# Patient Record
Sex: Female | Born: 1955 | Race: White | Hispanic: No | Marital: Married | Smoking: Never smoker
Health system: Southern US, Community
[De-identification: ages and names within clinical notes are randomized; demographics above are authoritative.]

## PROBLEM LIST (undated history)

## (undated) DIAGNOSIS — M2550 Pain in unspecified joint: Secondary | ICD-10-CM

## (undated) DIAGNOSIS — R600 Localized edema: Secondary | ICD-10-CM

## (undated) DIAGNOSIS — M1990 Unspecified osteoarthritis, unspecified site: Secondary | ICD-10-CM

## (undated) DIAGNOSIS — R42 Dizziness and giddiness: Secondary | ICD-10-CM

## (undated) DIAGNOSIS — M549 Dorsalgia, unspecified: Secondary | ICD-10-CM

## (undated) DIAGNOSIS — R519 Headache, unspecified: Secondary | ICD-10-CM

## (undated) DIAGNOSIS — G4730 Sleep apnea, unspecified: Secondary | ICD-10-CM

## (undated) DIAGNOSIS — T7840XA Allergy, unspecified, initial encounter: Secondary | ICD-10-CM

## (undated) DIAGNOSIS — E559 Vitamin D deficiency, unspecified: Secondary | ICD-10-CM

## (undated) DIAGNOSIS — Z889 Allergy status to unspecified drugs, medicaments and biological substances status: Secondary | ICD-10-CM

## (undated) DIAGNOSIS — R0602 Shortness of breath: Secondary | ICD-10-CM

## (undated) DIAGNOSIS — D649 Anemia, unspecified: Secondary | ICD-10-CM

## (undated) DIAGNOSIS — R51 Headache: Secondary | ICD-10-CM

## (undated) DIAGNOSIS — J45909 Unspecified asthma, uncomplicated: Secondary | ICD-10-CM

## (undated) HISTORY — PX: TONSILLECTOMY: SUR1361

## (undated) HISTORY — DX: Vitamin D deficiency, unspecified: E55.9

## (undated) HISTORY — DX: Dizziness and giddiness: R42

## (undated) HISTORY — PX: FRACTURE SURGERY: SHX138

## (undated) HISTORY — DX: Pain in unspecified joint: M25.50

## (undated) HISTORY — PX: OTHER SURGICAL HISTORY: SHX169

## (undated) HISTORY — DX: Localized edema: R60.0

## (undated) HISTORY — PX: LIPOMA EXCISION: SHX5283

## (undated) HISTORY — DX: Sleep apnea, unspecified: G47.30

## (undated) HISTORY — PX: ABDOMINAL HYSTERECTOMY: SHX81

## (undated) HISTORY — DX: Unspecified asthma, uncomplicated: J45.909

## (undated) HISTORY — DX: Headache, unspecified: R51.9

## (undated) HISTORY — PX: JOINT REPLACEMENT: SHX530

## (undated) HISTORY — DX: Morbid (severe) obesity due to excess calories: E66.01

## (undated) HISTORY — DX: Unspecified osteoarthritis, unspecified site: M19.90

## (undated) HISTORY — PX: KNEE ARTHROSCOPY: SHX127

## (undated) HISTORY — DX: Dorsalgia, unspecified: M54.9

## (undated) HISTORY — DX: Allergy, unspecified, initial encounter: T78.40XA

## (undated) HISTORY — DX: Anemia, unspecified: D64.9

## (undated) HISTORY — PX: HEEL SPUR EXCISION: SHX1733

## (undated) HISTORY — PX: TUBAL LIGATION: SHX77

---

## 1998-08-16 ENCOUNTER — Other Ambulatory Visit: Admission: RE | Admit: 1998-08-16 | Discharge: 1998-08-16 | Payer: Self-pay | Admitting: Gynecology

## 2001-02-24 ENCOUNTER — Encounter (INDEPENDENT_AMBULATORY_CARE_PROVIDER_SITE_OTHER): Payer: Self-pay | Admitting: Specialist

## 2001-02-24 ENCOUNTER — Other Ambulatory Visit: Admission: RE | Admit: 2001-02-24 | Discharge: 2001-02-24 | Payer: Self-pay | Admitting: *Deleted

## 2001-04-15 ENCOUNTER — Encounter (INDEPENDENT_AMBULATORY_CARE_PROVIDER_SITE_OTHER): Payer: Self-pay

## 2001-04-15 ENCOUNTER — Observation Stay (HOSPITAL_COMMUNITY): Admission: RE | Admit: 2001-04-15 | Discharge: 2001-04-16 | Payer: Self-pay | Admitting: *Deleted

## 2002-02-10 ENCOUNTER — Other Ambulatory Visit: Admission: RE | Admit: 2002-02-10 | Discharge: 2002-02-10 | Payer: Self-pay | Admitting: *Deleted

## 2003-01-28 ENCOUNTER — Other Ambulatory Visit: Admission: RE | Admit: 2003-01-28 | Discharge: 2003-01-28 | Payer: Self-pay | Admitting: *Deleted

## 2005-09-06 ENCOUNTER — Other Ambulatory Visit: Admission: RE | Admit: 2005-09-06 | Discharge: 2005-09-06 | Payer: Self-pay | Admitting: *Deleted

## 2009-04-11 ENCOUNTER — Encounter: Payer: Self-pay | Admitting: Pulmonary Disease

## 2009-05-10 ENCOUNTER — Ambulatory Visit: Payer: Self-pay | Admitting: Pulmonary Disease

## 2009-05-10 DIAGNOSIS — J984 Other disorders of lung: Secondary | ICD-10-CM

## 2009-05-10 DIAGNOSIS — J22 Unspecified acute lower respiratory infection: Secondary | ICD-10-CM | POA: Insufficient documentation

## 2009-05-10 HISTORY — DX: Unspecified acute lower respiratory infection: J22

## 2009-09-30 ENCOUNTER — Encounter: Payer: Self-pay | Admitting: Pulmonary Disease

## 2009-10-10 ENCOUNTER — Encounter: Payer: Self-pay | Admitting: Pulmonary Disease

## 2009-10-19 ENCOUNTER — Encounter: Payer: Self-pay | Admitting: Pulmonary Disease

## 2009-10-20 ENCOUNTER — Encounter: Payer: Self-pay | Admitting: Pulmonary Disease

## 2009-10-26 ENCOUNTER — Telehealth: Payer: Self-pay | Admitting: Pulmonary Disease

## 2010-03-03 ENCOUNTER — Ambulatory Visit (HOSPITAL_BASED_OUTPATIENT_CLINIC_OR_DEPARTMENT_OTHER): Admission: RE | Admit: 2010-03-03 | Discharge: 2010-03-03 | Payer: Self-pay | Admitting: Orthopedic Surgery

## 2010-09-19 NOTE — Miscellaneous (Signed)
Summary: f/u ct chest with stable RLL nodule.  Clinical Lists Changes  f/u ct chest with no change in RLL nodule.  Will need one more noncontrast f/u in one year.  Appended Document: f/u ct chest with stable RLL nodule. megan, please let pt know that chest ct stable.  would check one more time in a year, then no further.  Appended Document: f/u ct chest with stable RLL nodule. ATC pt at home #.  NA and unable to leave message.  "Voicemail box full."  ATC pt at work #. Unable to reach pt.  Will send pt a letter to call our office to discuss test results.

## 2010-09-19 NOTE — Miscellaneous (Signed)
Summary: Orders Update   Clinical Lists Changes  Orders: Added new Referral order of Radiology Referral (Radiology) - Signed 

## 2010-09-19 NOTE — Letter (Signed)
Summary: [facility]  [address]   [place], [place] [address]   Phone: [phone]  Fax: [phone]    10/20/2009  Rebecca Rojas [address] [place], [place]  [address]  Dear [name],   We have attempted to contact you by phone but have been unsuccessful.  Please call our office at your earliest convenience so that we may discuss your test results with you.  Thank you.       Sincerely,   Marcelyn Bruins, M.D.

## 2010-09-19 NOTE — Progress Notes (Signed)
Summary: returned call/ results  Phone Note Call from Patient   Caller: Patient Call For: clance Summary of Call: pt returned call re: results. call [phone] (i have changed this home # per pt request)  Initial call taken by: Tivis Ringer, CNA,  October 26, 2009 2:04 PM  Follow-up for Phone Call        pt advised of results and need to have f/u scan in one year. order placed. Carron Curie CMA  October 26, 2009 2:16 PM

## 2010-09-20 HISTORY — PX: COLONOSCOPY: SHX174

## 2010-10-18 ENCOUNTER — Other Ambulatory Visit: Payer: Self-pay | Admitting: Pulmonary Disease

## 2010-10-18 DIAGNOSIS — R911 Solitary pulmonary nodule: Secondary | ICD-10-CM

## 2010-10-24 ENCOUNTER — Other Ambulatory Visit: Payer: Self-pay

## 2010-10-24 ENCOUNTER — Encounter: Payer: Self-pay | Admitting: Pulmonary Disease

## 2010-11-01 ENCOUNTER — Encounter: Payer: Self-pay | Admitting: Pulmonary Disease

## 2010-11-07 NOTE — Miscellaneous (Signed)
Summary: f/u ct chest stable..no further f/u.  Clinical Lists Changes  ct chest 10/2010 shows no change in nodules...therefore, no further f/u needed.  Appended Document: f/u ct chest stable..no further f/u. megan, please let pt know that ct chest stable.  no further f/u needed...good news.  Appended Document: f/u ct chest stable..no further f/u. [id number]  Appended Document: f/u ct chest stable..no further f/u. pt called back.  informed her of ct results.  pt verbalized understanding and denied any questions.

## 2010-11-07 NOTE — Miscellaneous (Signed)
Summary: Orders Update   Clinical Lists Changes  Orders: Added new Referral order of Radiology Referral (Radiology) - Signed 

## 2011-01-05 NOTE — Op Note (Signed)
[facility]  Patient:    Rebecca Rojas, Rebecca Rojas Visit Number: [id number] MRN: [id number]          Service Type: SUR Location: 4W 0448 01 Attending Physician:  Collene Schlichter Dictated by:   Almedia Balls Randell Patient, M.D. Proc. Date: 04/15/01 Adm. Date:  04/15/2001   CC:         Harl Bowie, M.D.   Operative Report  PREOPERATIVE DIAGNOSIS:  Abnormal uterine bleeding with pain.  POSTOPERATIVE DIAGNOSES: 1. Abnormal uterine bleeding with pain, pending pathology. 2. 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  OPERATION:  Abdominal supracervical hysterectomy.  SURGEON:  Almedia Balls. Randell Patient, M.D.  ASSISTANT:  Harl Bowie, M.D.  ANESTHESIA:  General endotracheal anesthesia.  INDICATIONS:  The patient is a 55 year old with the above-noted problems.  She has been counselled as to the need for surgery to evaluate and treat these problems.  She has been fully counselled as to the nature of the procedure and the risks involved to include risks of anesthesia, injury to bowel, bladder, blood vessels, ureters, postoperative hemorrhage, infection, and recuperation, use of hormone replacement therapy should her ovaries be removed.  She fully understands all of these considerations and wishes to proceed on 15 April 2001.  DESCRIPTION OF PROCEDURE:  On entry into the abdomen, there were noted to be some filmy adhesions involving omentum to the anterior peritoneal surface in the area of the umbilicus.  The area of the lower liver edge, diaphragms, spleen, kidneys, periaortic areas, and appendix were normal to visualization and/or palpation.  The uterus was mid posterior, approximately 8-week gestational size, somewhat soft.  There were cysts of Morgagni on the proximal tubes bilaterally.  There was a larger functional cyst on the left ovary which contained serous fluid.  There was an adhesion involving the distal end of the left tube to a loop of  bowel.  DESCRIPTION OF PROCEDURE:     With the patient under general anesthesia, prepared and draped in the usual sterile fashion, with the Foley catheter in the bladder, a lower abdominal transverse incision was made and carried into the peritoneal cavity without difficulty.  A self-retaining retractor was placed, and the bowel was packed off.  Kelly clamps were placed on the uterine/ovarian anastomoses, tubes and round ligaments bilaterally for traction and hemostasis.  Bovie electrocautery was used to transect the round ligaments bilaterally with entry into the retroperitoneal space and development of the bladder flap anteriorly.  Heaney clamps were then used to clamp the uterine/ovarian anastomoses and tubes bilaterally; these structure were cut free of the uterus and doubly ligated with #1 chromic catgut for conservation of both ovaries.  The hydatid cysts were removed without difficulty using Bovie electrocoagulation.  Skeletonization of the uterine vessels and development of the bladder flap anteriorly was then carried out without difficulty.  Heaney clamps were used to clamp the uterine vessels bilaterally; these structures were then cut and suture ligated with #1 chromic catgut.  Heaney clamps were used to clamp the cardinal ligaments bilaterally which were then cut and suture ligated with #1 chronic catgut. Figure-of-eight  sutures were necessary in the left paracervical area for hemostasis.  The uterine fundus was then excised from the uterine cervix using Bovie electrocoagulation.  Small bleeders were rendered hemostatic Bovie Electrocoagulation.  Figure-of-eight sutures were used to reapproximate the cervix and to render it hemostatic.  Small bleeders under the bladder flap were rendered hemostatic with Bovie electrocoagulation.  The area was lavaged with copious amounts of  lactated Ringers.  After noting that hemostasis was maintained in this area, a piece of Surgicel was  placed under the bladder flap for further hemostasis.  The adnexal structures were suspended from the round ligaments bilaterally with interrupted suture of #1 chromic catgut.  At this point with good hemostasis and correct sponge and instrument count, the peritoneum was closed with a continuous suture of #0 Vicryl.  Fascia was closed with two sutures of #0 Vicryl which were brought from the lateral aspects of the incision and tied separately in the midline.  Subcutaneous fat was reapproximated with interrupted sutures of 0 Vicryl.  Skin was closed with a subcuticular suture of 3-0 plain catgut.  Estimated blood loss 250 ml.  The patient was taken to the recovery room in good condition with clear urine in the Foley catheter tubing.  She will be placed on 23-hour observation following surgery. Dictated by:   Almedia Balls Randell Patient, M.D. Attending Physician:  Collene Schlichter DD:  04/15/01 TD:  04/15/01 Job: [id number]

## 2011-01-05 NOTE — Discharge Summary (Signed)
[facility]  Patient:    Rebecca Rojas, Rebecca Rojas Visit Number: [id number] MRN: [id number]          Service Type: SUR Location: 4W 0448 01 Attending Physician:  Collene Schlichter Dictated by:   Almedia Balls Randell Patient, M.D. Adm. Date:  04/15/2001 Disc. Date: 04/16/01                             Discharge Summary  HISTORY OF PRESENT ILLNESS:  The patient is a 55 year old with abnormal uterine bleeding and pelvic pain for hysterectomy with possible bilateral salpingo-oophorectomy on April 15, 2001.  The remainder of her History and Physical are as previously dictated.  LABORATORY DATA AND X-RAY FINDINGS:  Preoperative hemoglobin 13.4.  HOSPITAL COURSE:  The patient was taken to the operating room on August 27, at which time abdominal supracervical hysterectomy, lysis of adhesions and excisions of cysts were performed.  The patient did well postoperatively. Diet and ambulation progressed over the evening of August 27, and early morning of August 28.  On the morning of August 28, she was afebrile experiencing no problems and it was felt that she could be discharged at this time.  DISCHARGE DIAGNOSES: 1. Abnormal uterine bleeding. 2. Pelvic pain. 3. Pelvic adhesions.  PROCEDURE:  Abdominal supracervical hysterectomy with lysis of adhesions. Pathology report unavailable at the time of dictation.  DISPOSITION:  Discharged to home.  FOLLOWUP:  Return to the office in two weeks for followup.  ACTIVITY:  Gradually progress her activities over several weeks at home and to limit lifting and driving x 2 weeks.  She was fully ambulatory.  DIET:  Regular.  CONDITION ON DISCHARGE:  Good.  DISCHARGE MEDICATIONS: 1. Mepergan Fortis or generic #30, to be taken one to two q.4h. p.r.n. pain. 2. Doxycycline 100 mg #12 to be taken one b.i.d.  SPECIAL INSTRUCTIONS:  She will call with any problems. Dictated by:   Almedia Balls Randell Patient, M.D. Attending Physician:  Collene Schlichter DD:  04/16/01 TD:  04/16/01 Job: [id number]

## 2011-01-05 NOTE — H&P (Signed)
[facility]  Patient:    Rebecca Rojas, Rebecca Rojas Visit Number: [id number] MRN: [id number]          Service Type: Attending:  Almedia Balls. Randell Patient, M.D. Dictated by:   Almedia Balls Randell Patient, M.D. Adm. Date:  04/15/01                           History and Physical  CHIEF COMPLAINT:  Excessive bleeding, irregular periods.  HISTORY OF PRESENT ILLNESS:  The patient is a 56 year old, gravida 3, para 2, whose last menstrual period has been quite difficult to ascertain as she has continued to bleed intermittently over the past 4 to 5 months.  She underwent saline sonogram in early July with findings of abnormal endometrium and enlarged uterus.  Hysteroscopy and D&C was performed in July 2002, with benign findings.  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  Since that procedure, she has continued to have abnormal bleeding without any improvement after attempts at hormone suppression.  Pap smear was normal in early June 2002.  She is admitted at this time for transabdominal hysterectomy and bilateral salpingo-oophorectomy if necessary.  She has been fully counseled as to the nature of this procedure and the risks involved, to include risks of anesthesia, injury to bowel, bladder, blood vessel, ureters postoperative hemorrhage, infection, recuperation, and possible hormone replacement should her ovaries be removed.  She fully understands all of these considerations, and wishes to proceed on April 15, 2001.  PAST MEDICAL HISTORY: 1. Tubal ligation in 1986. 2. Tonsillectomy as a child. 3. She has had a shoulder injury at work, for which she takes Celebrex and    Topamax.  ALLERGIES:  No known drug allergies.  FAMILY HISTORY:  Mother and grandfather with cardiovascular disease.  REVIEW OF SYSTEMS:  HEENT:  Wears glasses.  CARDIORESPIRATORY:  Negative.  GASTROINTESTINAL:  Negative.  GENITOURINARY:  As noted above.  NEUROMUSCULAR:  Negative.  PHYSICAL  EXAMINATION:  VITAL SIGNS:  Height 5 feet 2 inches, weight 230 pounds, blood pressure 114/82, pulse 72, respiratory rate 18.  GENERAL:  Well-developed white female in no acute distress.  HEENT:  Within normal limits.  NECK:  Supple without masses, adenopathy, or bruits.  HEART:  Regular rate and rhythm without murmurs.  LUNGS:  Clear to auscultation and percussion.  BREASTS:  Sitting and laying without mass.  Axilla negative.  ABDOMEN:  Flat and soft without mass, slightly tender in both lower quadrants. Bowel sounds active.  PELVIC:  External genitalia, Bartho, urethra, and Skeens glands within normal limits.  Vagina is clean.  Cervix slightly inflamed.  Uterus mid position approximately 12 to [redacted] weeks gestational size.  There are no palpable adnexal masses.  Anterior and posterior cul-de-sac exam is confirmatory.  EXTREMITIES:  Within normal limits.  CENTRAL NERVOUS SYSTEM:  Grossly intact.  SKIN:  Without suspicious lesions.  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  DISPOSITION:  As noted above. Dictated by:   Almedia Balls Randell Patient, M.D. Attending:  Almedia Balls. Randell Patient, M.D. DD:  04/10/01 TD:  04/11/01 Job: [id number]

## 2011-07-19 ENCOUNTER — Encounter (HOSPITAL_COMMUNITY): Payer: Self-pay | Admitting: Pharmacy Technician

## 2011-07-30 ENCOUNTER — Encounter (HOSPITAL_COMMUNITY): Payer: Self-pay

## 2011-07-30 ENCOUNTER — Encounter (HOSPITAL_COMMUNITY)
Admission: RE | Admit: 2011-07-30 | Discharge: 2011-07-30 | Disposition: A | Discharge status: Home / Self Care | Payer: BC Managed Care – PPO | Source: Ambulatory Visit | Attending: Orthopedic Surgery | Admitting: Orthopedic Surgery

## 2011-07-30 ENCOUNTER — Other Ambulatory Visit: Payer: Self-pay

## 2011-07-30 ENCOUNTER — Other Ambulatory Visit: Payer: Self-pay | Admitting: Orthopedic Surgery

## 2011-07-30 HISTORY — DX: Unspecified osteoarthritis, unspecified site: M19.90

## 2011-07-30 HISTORY — DX: Headache: R51

## 2011-07-30 HISTORY — DX: Allergy status to unspecified drugs, medicaments and biological substances: Z88.9

## 2011-07-30 HISTORY — DX: Shortness of breath: R06.02

## 2011-07-30 LAB — COMPREHENSIVE METABOLIC PANEL
ALT: 15 U/L (ref 0–35)
AST: 16 U/L (ref 0–37)
Albumin: 3.8 g/dL (ref 3.5–5.2)
Alkaline Phosphatase: 80 U/L (ref 39–117)
BUN: 11 mg/dL (ref 6–23)
Chloride: 104 mEq/L (ref 96–112)
Potassium: 3.8 mEq/L (ref 3.5–5.1)
Sodium: 142 mEq/L (ref 135–145)
Total Bilirubin: 0.3 mg/dL (ref 0.3–1.2)

## 2011-07-30 LAB — CBC
HCT: 39.8 % (ref 36.0–46.0)
Hemoglobin: 12.2 g/dL (ref 12.0–15.0)
RDW: 13.8 % (ref 11.5–15.5)
WBC: 9.2 10*3/uL (ref 4.0–10.5)

## 2011-07-30 LAB — DIFFERENTIAL
Basophils Relative: 0 % (ref 0–1)
Eosinophils Absolute: 0.2 10*3/uL (ref 0.0–0.7)
Eosinophils Relative: 3 % (ref 0–5)
Monocytes Absolute: 0.5 10*3/uL (ref 0.1–1.0)
Monocytes Relative: 6 % (ref 3–12)
Neutrophils Relative %: 53 % (ref 43–77)

## 2011-07-30 LAB — URINALYSIS, ROUTINE W REFLEX MICROSCOPIC
Glucose, UA: NEGATIVE mg/dL
Leukocytes, UA: NEGATIVE
Nitrite: NEGATIVE
Specific Gravity, Urine: 1.01 (ref 1.005–1.030)
pH: 7.5 (ref 5.0–8.0)

## 2011-07-30 LAB — ABO/RH: ABO/RH(D): O POS

## 2011-07-30 LAB — TYPE AND SCREEN

## 2011-07-30 LAB — SURGICAL PCR SCREEN: MRSA, PCR: NEGATIVE

## 2011-07-30 LAB — APTT: aPTT: 33 seconds (ref 24–37)

## 2011-07-30 MED ORDER — CEFAZOLIN SODIUM-DEXTROSE 2-3 GM-% IV SOLR: 2.0000 g | INTRAVENOUS | Status: DC

## 2011-07-30 MED ORDER — POVIDONE-IODINE 7.5 % EX SOLN: Freq: Once | CUTANEOUS | Status: DC

## 2011-07-30 NOTE — Pre-Procedure Instructions (Signed)
[address]  07/30/2011  Your procedure is scheduled on: Friday   08/03/11  Report to [facility] at 530 AM.  Call this number if you have problems the morning of surgery: [phone]   Remember:   Do not eat food:After Midnight.  May have clear liquids: up to 4 Hours before arrival.  Clear liquids include soda, tea, black coffee, apple or grape juice, broth.  Take these medicines the morning of surgery with A SIP OF WATER: advair, nasonex, singulair, ultram   Do not wear jewelry, make-up or nail polish.  Do not wear lotions, powders, or perfumes. You may wear deodorant.  Do not shave 48 hours prior to surgery.  Do not bring valuables to the hospital.  Contacts, dentures or bridgework may not be worn into surgery.  Leave suitcase in the car. After surgery it may be brought to your room.  For patients admitted to the hospital, checkout time is 11:00 AM the day of discharge.   Patients discharged the day of surgery will not be allowed to drive home.  Name and phone number of your driver:   Special Instructions: CHG Shower Use Special Wash: 1/2 bottle night before surgery and 1/2 bottle morning of surgery.   Please read over the following fact sheets that you were given: Pain Booklet, MRSA Information and Surgical Site Infection Prevention

## 2011-07-31 NOTE — Consult Note (Signed)
Anesthesia:  Patient is a 55 year old female scheduled for right TKR on 08/03/11.  Her past medical history includes headaches and OA.  I was asked to review her preoperative EKG showing NSR with incomplete right BBB.  This is unchanged since 02/28/10.  Plan to proceed.  Labs and CXR noted.

## 2011-08-03 ENCOUNTER — Inpatient Hospital Stay (HOSPITAL_COMMUNITY)
Admission: RE | Admit: 2011-08-03 | Discharge: 2011-08-06 | DRG: 209 | Disposition: A | Discharge status: Home Health | Payer: BC Managed Care – PPO | Source: Ambulatory Visit | Attending: Orthopedic Surgery | Admitting: Orthopedic Surgery

## 2011-08-03 ENCOUNTER — Inpatient Hospital Stay (HOSPITAL_COMMUNITY): Payer: BC Managed Care – PPO | Admitting: Vascular Surgery

## 2011-08-03 ENCOUNTER — Encounter (HOSPITAL_COMMUNITY)
Admission: RE | Disposition: A | Discharge status: Home Health | Payer: Self-pay | Source: Ambulatory Visit | Attending: Orthopedic Surgery

## 2011-08-03 ENCOUNTER — Encounter (HOSPITAL_COMMUNITY): Payer: Self-pay

## 2011-08-03 ENCOUNTER — Encounter (HOSPITAL_COMMUNITY): Payer: Self-pay | Admitting: Vascular Surgery

## 2011-08-03 DIAGNOSIS — M1710 Unilateral primary osteoarthritis, unspecified knee: Secondary | ICD-10-CM

## 2011-08-03 DIAGNOSIS — G43909 Migraine, unspecified, not intractable, without status migrainosus: Secondary | ICD-10-CM | POA: Diagnosis present

## 2011-08-03 DIAGNOSIS — J309 Allergic rhinitis, unspecified: Secondary | ICD-10-CM | POA: Diagnosis present

## 2011-08-03 DIAGNOSIS — Z01812 Encounter for preprocedural laboratory examination: Secondary | ICD-10-CM

## 2011-08-03 DIAGNOSIS — E669 Obesity, unspecified: Secondary | ICD-10-CM | POA: Diagnosis present

## 2011-08-03 HISTORY — PX: TOTAL KNEE ARTHROPLASTY: SHX125

## 2011-08-03 SURGERY — ARTHROPLASTY, KNEE, TOTAL
Anesthesia: Regional | Site: Knee | Laterality: Right | Wound class: Clean

## 2011-08-03 MED ORDER — METHOCARBAMOL 500 MG PO TABS
500.0000 mg | ORAL_TABLET | Freq: Four times a day (QID) | ORAL | Status: DC | PRN
  Administered 2011-08-04 – 2011-08-06 (×6): 500 mg via ORAL
  Filled 2011-08-03 (×6): qty 1

## 2011-08-03 MED ORDER — FENTANYL CITRATE 0.05 MG/ML IJ SOLN
INTRAMUSCULAR | Status: DC | PRN
  Administered 2011-08-03: 50 ug via INTRAVENOUS
  Administered 2011-08-03: 150 ug via INTRAVENOUS
  Administered 2011-08-03: 100 ug via INTRAVENOUS
  Administered 2011-08-03: 50 ug via INTRAVENOUS

## 2011-08-03 MED ORDER — CEFUROXIME SODIUM 1.5 G IJ SOLR
INTRAMUSCULAR | Status: DC | PRN
  Administered 2011-08-03: 1.5 g

## 2011-08-03 MED ORDER — MIDAZOLAM HCL 5 MG/5ML IJ SOLN
INTRAMUSCULAR | Status: DC | PRN
  Administered 2011-08-03: 2 mg via INTRAVENOUS

## 2011-08-03 MED ORDER — ACETAMINOPHEN 10 MG/ML IV SOLN
INTRAVENOUS | Status: DC | PRN
  Administered 2011-08-03: 1000 mg via INTRAVENOUS

## 2011-08-03 MED ORDER — ONDANSETRON HCL 4 MG/2ML IJ SOLN
INTRAMUSCULAR | Status: DC | PRN
  Administered 2011-08-03: 4 mg via INTRAVENOUS

## 2011-08-03 MED ORDER — LORATADINE 10 MG PO TABS
10.0000 mg | ORAL_TABLET | Freq: Every day | ORAL | Status: DC
  Administered 2011-08-03 – 2011-08-05 (×3): 10 mg via ORAL
  Filled 2011-08-03 (×6): qty 1

## 2011-08-03 MED ORDER — WARFARIN VIDEO
Freq: Once | Status: AC
  Administered 2011-08-04: 09:00:00

## 2011-08-03 MED ORDER — LEVOCETIRIZINE DIHYDROCHLORIDE 5 MG PO TABS: 5.0000 mg | ORAL_TABLET | Freq: Every evening | ORAL | Status: DC

## 2011-08-03 MED ORDER — LACTATED RINGERS IV SOLN
INTRAVENOUS | Status: DC | PRN
  Administered 2011-08-03 (×3): via INTRAVENOUS

## 2011-08-03 MED ORDER — KETOROLAC TROMETHAMINE 30 MG/ML IJ SOLN
INTRAMUSCULAR | Status: DC | PRN
  Administered 2011-08-03: 30 mg via INTRAVENOUS

## 2011-08-03 MED ORDER — LACTATED RINGERS IV SOLN: INTRAVENOUS | Status: DC

## 2011-08-03 MED ORDER — ALUM & MAG HYDROXIDE-SIMETH 200-200-20 MG/5ML PO SUSP: 30.0000 mL | ORAL | Status: DC | PRN

## 2011-08-03 MED ORDER — ACETAMINOPHEN 650 MG RE SUPP: 650.0000 mg | Freq: Four times a day (QID) | RECTAL | Status: DC | PRN

## 2011-08-03 MED ORDER — DIPHENHYDRAMINE HCL 50 MG/ML IJ SOLN: 12.5000 mg | Freq: Four times a day (QID) | INTRAMUSCULAR | Status: DC | PRN

## 2011-08-03 MED ORDER — ACETAMINOPHEN 10 MG/ML IV SOLN
1000.0000 mg | Freq: Four times a day (QID) | INTRAVENOUS | Status: AC
  Administered 2011-08-03 – 2011-08-04 (×2): 1000 mg via INTRAVENOUS
  Filled 2011-08-03 (×3): qty 100

## 2011-08-03 MED ORDER — DEXTROSE 5 % IV SOLN
INTRAVENOUS | Status: DC | PRN
  Administered 2011-08-03 (×2): via INTRAVENOUS

## 2011-08-03 MED ORDER — PROPOFOL 10 MG/ML IV EMUL
INTRAVENOUS | Status: DC | PRN
  Administered 2011-08-03: 200 mg via INTRAVENOUS

## 2011-08-03 MED ORDER — WARFARIN SODIUM 7.5 MG PO TABS
7.5000 mg | ORAL_TABLET | Freq: Once | ORAL | Status: AC
  Administered 2011-08-03: 7.5 mg via ORAL
  Filled 2011-08-03: qty 1

## 2011-08-03 MED ORDER — HYDROMORPHONE HCL PF 1 MG/ML IJ SOLN
0.2500 mg | INTRAMUSCULAR | Status: DC | PRN
  Administered 2011-08-03 (×2): 0.5 mg via INTRAVENOUS

## 2011-08-03 MED ORDER — SODIUM CHLORIDE 0.9 % IR SOLN
Status: DC | PRN
  Administered 2011-08-03: 3000 mL

## 2011-08-03 MED ORDER — ONDANSETRON HCL 4 MG PO TABS: 4.0000 mg | ORAL_TABLET | Freq: Four times a day (QID) | ORAL | Status: DC | PRN

## 2011-08-03 MED ORDER — ZOLPIDEM TARTRATE 5 MG PO TABS: 5.0000 mg | ORAL_TABLET | Freq: Every evening | ORAL | Status: DC | PRN

## 2011-08-03 MED ORDER — CEFAZOLIN SODIUM 1-5 GM-% IV SOLN
INTRAVENOUS | Status: DC | PRN
  Administered 2011-08-03: 2 g via INTRAVENOUS

## 2011-08-03 MED ORDER — FENTANYL CITRATE 0.05 MG/ML IJ SOLN
INTRAMUSCULAR | Status: AC
  Filled 2011-08-03: qty 2

## 2011-08-03 MED ORDER — ONDANSETRON HCL 4 MG/2ML IJ SOLN: 4.0000 mg | Freq: Once | INTRAMUSCULAR | Status: DC | PRN

## 2011-08-03 MED ORDER — CEFAZOLIN SODIUM-DEXTROSE 2-3 GM-% IV SOLR
2.0000 g | Freq: Four times a day (QID) | INTRAVENOUS | Status: AC
  Administered 2011-08-03 – 2011-08-04 (×3): 2 g via INTRAVENOUS
  Filled 2011-08-03 (×3): qty 50

## 2011-08-03 MED ORDER — COUMADIN BOOK
Freq: Once | Status: AC
  Administered 2011-08-03: 19:00:00
  Filled 2011-08-03: qty 1

## 2011-08-03 MED ORDER — METHOCARBAMOL 100 MG/ML IJ SOLN: 500.0000 mg | Freq: Four times a day (QID) | INTRAVENOUS | Status: DC | PRN

## 2011-08-03 MED ORDER — SODIUM CHLORIDE 0.9 % IJ SOLN: 9.0000 mL | INTRAMUSCULAR | Status: DC | PRN

## 2011-08-03 MED ORDER — OXYCODONE HCL 5 MG PO TABS
5.0000 mg | ORAL_TABLET | ORAL | Status: DC | PRN
  Administered 2011-08-05 – 2011-08-06 (×6): 10 mg via ORAL
  Filled 2011-08-03 (×6): qty 2

## 2011-08-03 MED ORDER — ACETAMINOPHEN 325 MG PO TABS: 650.0000 mg | ORAL_TABLET | Freq: Four times a day (QID) | ORAL | Status: DC | PRN

## 2011-08-03 MED ORDER — NALOXONE HCL 0.4 MG/ML IJ SOLN: 0.4000 mg | INTRAMUSCULAR | Status: DC | PRN

## 2011-08-03 MED ORDER — DIPHENHYDRAMINE HCL 12.5 MG/5ML PO ELIX
12.5000 mg | ORAL_SOLUTION | Freq: Four times a day (QID) | ORAL | Status: DC | PRN
  Filled 2011-08-03: qty 5

## 2011-08-03 MED ORDER — ROCURONIUM BROMIDE 100 MG/10ML IV SOLN
INTRAVENOUS | Status: DC | PRN
  Administered 2011-08-03: 50 mg via INTRAVENOUS
  Administered 2011-08-03: 30 mg via INTRAVENOUS

## 2011-08-03 MED ORDER — ONDANSETRON HCL 4 MG/2ML IJ SOLN: 4.0000 mg | Freq: Four times a day (QID) | INTRAMUSCULAR | Status: DC | PRN

## 2011-08-03 MED ORDER — PHENOL 1.4 % MT LIQD
1.0000 | OROMUCOSAL | Status: DC | PRN
  Filled 2011-08-03: qty 177

## 2011-08-03 MED ORDER — FERROUS SULFATE 325 (65 FE) MG PO TABS
325.0000 mg | ORAL_TABLET | Freq: Two times a day (BID) | ORAL | Status: DC
  Administered 2011-08-03 – 2011-08-06 (×6): 325 mg via ORAL
  Filled 2011-08-03 (×9): qty 1

## 2011-08-03 MED ORDER — MONTELUKAST SODIUM 10 MG PO TABS
10.0000 mg | ORAL_TABLET | Freq: Every day | ORAL | Status: DC
  Administered 2011-08-03 – 2011-08-05 (×3): 10 mg via ORAL
  Filled 2011-08-03 (×5): qty 1

## 2011-08-03 MED ORDER — DEXTROSE-NACL 5-0.45 % IV SOLN
INTRAVENOUS | Status: DC
  Administered 2011-08-03: 15:00:00 via INTRAVENOUS

## 2011-08-03 MED ORDER — FLUTICASONE PROPIONATE 50 MCG/ACT NA SUSP
2.0000 | Freq: Every day | NASAL | Status: DC
  Administered 2011-08-03 – 2011-08-06 (×2): 2 via NASAL
  Filled 2011-08-03: qty 16

## 2011-08-03 MED ORDER — MENTHOL 3 MG MT LOZG: 1.0000 | LOZENGE | OROMUCOSAL | Status: DC | PRN

## 2011-08-03 MED ORDER — MORPHINE SULFATE (PF) 1 MG/ML IV SOLN
INTRAVENOUS | Status: DC
  Administered 2011-08-03: 6 mg via INTRAVENOUS
  Administered 2011-08-03: 16:00:00 via INTRAVENOUS
  Administered 2011-08-04: 7.5 mg via INTRAVENOUS
  Administered 2011-08-04: 6 mg via INTRAVENOUS
  Administered 2011-08-04 (×2): via INTRAVENOUS
  Filled 2011-08-03 (×6): qty 25

## 2011-08-03 MED ORDER — FENTANYL CITRATE 0.05 MG/ML IJ SOLN
100.0000 ug | Freq: Once | INTRAMUSCULAR | Status: AC
  Administered 2011-08-03: 100 ug via INTRAVENOUS

## 2011-08-03 MED ORDER — FLUTICASONE-SALMETEROL 250-50 MCG/DOSE IN AEPB
1.0000 | INHALATION_SPRAY | Freq: Two times a day (BID) | RESPIRATORY_TRACT | Status: DC
  Administered 2011-08-03 – 2011-08-06 (×6): 1 via RESPIRATORY_TRACT
  Filled 2011-08-03: qty 14

## 2011-08-03 MED ORDER — ALBUTEROL SULFATE (5 MG/ML) 0.5% IN NEBU
2.5000 mg | INHALATION_SOLUTION | Freq: Four times a day (QID) | RESPIRATORY_TRACT | Status: AC
  Administered 2011-08-03 – 2011-08-04 (×4): 2.5 mg via RESPIRATORY_TRACT
  Filled 2011-08-03 (×4): qty 0.5

## 2011-08-03 MED ORDER — LIDOCAINE HCL (CARDIAC) 20 MG/ML IV SOLN
INTRAVENOUS | Status: DC | PRN
  Administered 2011-08-03: 70 mg via INTRAVENOUS

## 2011-08-03 MED ORDER — BUPIVACAINE-EPINEPHRINE PF 0.5-1:200000 % IJ SOLN
INTRAMUSCULAR | Status: DC | PRN
  Administered 2011-08-03: 20 mL

## 2011-08-03 MED ORDER — SUMATRIPTAN SUCCINATE 100 MG PO TABS
100.0000 mg | ORAL_TABLET | ORAL | Status: DC | PRN
  Filled 2011-08-03: qty 1

## 2011-08-03 SURGICAL SUPPLY — 64 items
BANDAGE ESMARK 6X9 LF (GAUZE/BANDAGES/DRESSINGS) ×1 IMPLANT
BENZOIN TINCTURE PRP APPL 2/3 (GAUZE/BANDAGES/DRESSINGS) ×2 IMPLANT
BLADE SAGITTAL 25.0X1.19X90 (BLADE) ×2 IMPLANT
BLADE SAW SAG 90X13X1.27 (BLADE) ×2 IMPLANT
BNDG ESMARK 6X9 LF (GAUZE/BANDAGES/DRESSINGS) ×2
BOWL SMART MIX CTS (DISPOSABLE) ×2 IMPLANT
CEMENT HV SMART SET (Cement) ×4 IMPLANT
CLOSURE STERI STRIP 1/2 X4 (GAUZE/BANDAGES/DRESSINGS) ×2 IMPLANT
CLOTH BEACON ORANGE TIMEOUT ST (SAFETY) ×2 IMPLANT
COVER BACK TABLE 24X17X13 BIG (DRAPES) IMPLANT
COVER SURGICAL LIGHT HANDLE (MISCELLANEOUS) ×2 IMPLANT
CUFF TOURNIQUET SINGLE 34IN LL (TOURNIQUET CUFF) ×2 IMPLANT
CUFF TOURNIQUET SINGLE 44IN (TOURNIQUET CUFF) IMPLANT
DRAPE EXTREMITY T 121X128X90 (DRAPE) ×2 IMPLANT
DRAPE U-SHAPE 47X51 STRL (DRAPES) ×2 IMPLANT
DRSG PAD ABDOMINAL 8X10 ST (GAUZE/BANDAGES/DRESSINGS) ×2 IMPLANT
DURAPREP 26ML APPLICATOR (WOUND CARE) ×2 IMPLANT
ELECT REM PT RETURN 9FT ADLT (ELECTROSURGICAL) ×2
ELECTRODE REM PT RTRN 9FT ADLT (ELECTROSURGICAL) ×1 IMPLANT
EVACUATOR 1/8 PVC DRAIN (DRAIN) ×2 IMPLANT
FACESHIELD LNG OPTICON STERILE (SAFETY) ×2 IMPLANT
GAUZE XEROFORM 5X9 LF (GAUZE/BANDAGES/DRESSINGS) ×2 IMPLANT
GLOVE BIOGEL PI IND STRL 8 (GLOVE) ×2 IMPLANT
GLOVE BIOGEL PI INDICATOR 8 (GLOVE) ×2
GLOVE ECLIPSE 7.5 STRL STRAW (GLOVE) ×4 IMPLANT
GOWN PREVENTION PLUS XLARGE (GOWN DISPOSABLE) ×2 IMPLANT
GOWN SRG XL XLNG 56XLVL 4 (GOWN DISPOSABLE) ×1 IMPLANT
GOWN STRL NON-REIN LRG LVL3 (GOWN DISPOSABLE) ×2 IMPLANT
GOWN STRL NON-REIN XL XLG LVL4 (GOWN DISPOSABLE) ×1
HANDPIECE INTERPULSE COAX TIP (DISPOSABLE) ×1
HOOD PEEL AWAY FACE SHEILD DIS (HOOD) ×6 IMPLANT
IMMOBILIZER KNEE 20 (SOFTGOODS)
IMMOBILIZER KNEE 20 THIGH 36 (SOFTGOODS) IMPLANT
IMMOBILIZER KNEE 22 UNIV (SOFTGOODS) IMPLANT
IMMOBILIZER KNEE 24 THIGH 36 (MISCELLANEOUS) IMPLANT
IMMOBILIZER KNEE 24 UNIV (MISCELLANEOUS)
KIT BASIN OR (CUSTOM PROCEDURE TRAY) ×2 IMPLANT
KIT ROOM TURNOVER OR (KITS) ×2 IMPLANT
MANIFOLD NEPTUNE II (INSTRUMENTS) ×2 IMPLANT
MARKER SPHERE PSV REFLC THRD 5 (MARKER) ×6 IMPLANT
NEEDLE HYPO 25GX1X1/2 BEV (NEEDLE) IMPLANT
NS IRRIG 1000ML POUR BTL (IV SOLUTION) ×2 IMPLANT
PACK TOTAL JOINT (CUSTOM PROCEDURE TRAY) ×2 IMPLANT
PAD ARMBOARD 7.5X6 YLW CONV (MISCELLANEOUS) ×4 IMPLANT
PAD CAST 4YDX4 CTTN HI CHSV (CAST SUPPLIES) ×1 IMPLANT
PADDING CAST COTTON 4X4 STRL (CAST SUPPLIES) ×1
PADDING WEBRIL 4 STERILE (GAUZE/BANDAGES/DRESSINGS) ×2 IMPLANT
PIN SCHANZ 4MM 130MM (PIN) ×8 IMPLANT
SET HNDPC FAN SPRY TIP SCT (DISPOSABLE) ×1 IMPLANT
SPONGE GAUZE 4X4 12PLY (GAUZE/BANDAGES/DRESSINGS) ×2 IMPLANT
STAPLER VISISTAT 35W (STAPLE) IMPLANT
STRIP CLOSURE SKIN 1/2X4 (GAUZE/BANDAGES/DRESSINGS) ×2 IMPLANT
SUCTION FRAZIER TIP 10 FR DISP (SUCTIONS) ×2 IMPLANT
SUT MON AB 3-0 SH 27 (SUTURE)
SUT MON AB 3-0 SH27 (SUTURE) IMPLANT
SUT VIC AB 0 CTB1 27 (SUTURE) ×4 IMPLANT
SUT VIC AB 1 CT1 27 (SUTURE) ×2
SUT VIC AB 1 CT1 27XBRD ANBCTR (SUTURE) ×2 IMPLANT
SUT VIC AB 2-0 CTB1 (SUTURE) ×4 IMPLANT
SYR CONTROL 10ML LL (SYRINGE) IMPLANT
TOWEL OR 17X24 6PK STRL BLUE (TOWEL DISPOSABLE) ×2 IMPLANT
TOWEL OR 17X26 10 PK STRL BLUE (TOWEL DISPOSABLE) ×2 IMPLANT
TRAY FOLEY CATH 14FR (SET/KITS/TRAYS/PACK) ×2 IMPLANT
WATER STERILE IRR 1000ML POUR (IV SOLUTION) ×6 IMPLANT

## 2011-08-03 NOTE — Transfer of Care (Signed)
Immediate Anesthesia Transfer of Care Note  Patient: Rebecca Rojas  Procedure(s) Performed:  TOTAL KNEE ARTHROPLASTY - COMPUTER ASSISTED TOTAL KNEE REPLACEMENT with revision tibial component  Patient Location: PACU  Anesthesia Type: General  Level of Consciousness: awake, alert  and oriented  Airway & Oxygen Therapy: Patient Spontanous Breathing and Patient connected to nasal cannula oxygen  Post-op Assessment: Report given to PACU RN and Post -op Vital signs reviewed and stable  Post vital signs: Reviewed and stable  Complications: No apparent anesthesia complications

## 2011-08-03 NOTE — H&P (Signed)
PREOPERATIVE H&P  Chief Complaint: r. Knee pain  HPI: Rebecca Rojas is a 55 y.o. female who presents for evaluation of r. Knee pain. It has been present for greater than 1 year and has been worsening.  She has failed injection therapy, physical therapy, use of cain and activity modification.  She has bone on bone changes on her x-rays She has failed conservative measures. Pain is rated as severe.  Past Medical History  Diagnosis Date  . Shortness of breath     maybe with exertion  . Headache     occasional migraine  . Arthritis     osteoarthritis  . History of seasonal allergies    Past Surgical History  Procedure Date  . Tonsillectomy   . Fracture surgery     left 5th finger fx with bone graft repair  . Abdominal hysterectomy   . Tubal ligation    History   Social History  . Marital Status: Married    Spouse Name: N/A    Number of Children: N/A  . Years of Education: N/A   Social History Main Topics  . Smoking status: None  . Smokeless tobacco: None  . Alcohol Use: No  . Drug Use: No  . Sexually Active: Yes   Other Topics Concern  . None   Social History Narrative  . None   Family History  Problem Relation Age of Onset  . Anesthesia problems Neg Hx   . Hypotension Neg Hx   . Malignant hyperthermia Neg Hx   . Pseudochol deficiency Neg Hx    No Known Allergies Prior to Admission medications   Medication Sig Start Date End Date Taking? Authorizing Provider  etodolac (LODINE) 400 MG tablet Take 400 mg by mouth 2 (two) times daily.     Yes Historical Provider, MD  Fluticasone-Salmeterol (ADVAIR) 250-50 MCG/DOSE AEPB Inhale 1 puff into the lungs every 12 (twelve) hours.     Yes Historical Provider, MD  levocetirizine (XYZAL) 5 MG tablet Take 5 mg by mouth every evening.     Yes Historical Provider, MD  montelukast (SINGULAIR) 10 MG tablet Take 10 mg by mouth at bedtime.     Yes Historical Provider, MD  SUMAtriptan (IMITREX) 100 MG tablet Take 100 mg by mouth  every 2 (two) hours as needed. For migraines     Yes Historical Provider, MD  traMADol (ULTRAM) 50 MG tablet Take 50 mg by mouth every 6 (six) hours as needed. For pain; Maximum dose= 8 tablets per day    Yes Historical Provider, MD  EPINEPHrine (EPIPEN JR) 0.15 MG/0.3ML injection Inject 0.15 mg into the muscle as needed. For allergic reaction     Historical Provider, MD  mometasone (NASONEX) 50 MCG/ACT nasal spray Place 2 sprays into the nose daily.      Historical Provider, MD  PRESCRIPTION MEDICATION Inject 1 kit into the skin every 7 (seven) days. Allergy shot unknown to pt     Historical Provider, MD     Positive ROS: none  All other systems have been reviewed and were otherwise negative with the exception of those mentioned in the HPI and as above.  Physical Exam: Filed Vitals:   08/03/11 0952  BP:   Pulse: 82  Temp:   Resp: 20    General: Alert, no acute distress Cardiovascular: No pedal edema Respiratory: No cyanosis, no use of accessory musculature GI: No organomegaly, abdomen is soft and non-tender Skin: No lesions in the area of chief complaint Neurologic: Sensation  intact distally Psychiatric: Patient is competent for consent with normal mood and affect Lymphatic: No axillary or cervical lymphadenopathy  MUSCULOSKELETAL: r.knee limited ROM.  Pain on all ROM med knee pain no instaability.  Assessment/Plan: DEGENERATIVE JOINT DISEASE RIGHT KNEE Plan for Procedure(s): TOTAL KNEE ARTHROPLASTY with computer assist due to young age and obesity  The risks benefits and alternatives were discussed with the patient including but not limited to the risks of nonoperative treatment, versus surgical intervention including infection, bleeding, nerve injury, malunion, nonunion, hardware prominence, hardware failure, need for hardware removal, blood clots, cardiopulmonary complications, morbidity, mortality, among others, and they were willing to proceed.  Predicted outcome is good,  although there will be at least a six to nine month expected recovery.  Jood Retana L 08/03/2011 10:06 AM

## 2011-08-03 NOTE — Anesthesia Preprocedure Evaluation (Addendum)
Anesthesia Evaluation  Patient identified by MRN, date of birth, ID band Patient awake    Reviewed: Allergy & Precautions, H&P , NPO status , Patient's Chart, lab work & pertinent test results  Airway Mallampati: I TM Distance: >3 FB Neck ROM: full    Dental  (+) Teeth Intact and Dental Advisory Given   Pulmonary    Pulmonary exam normal       Cardiovascular Exercise Tolerance: Good neg cardio ROS regular Normal    Neuro/Psych  Headaches, Negative Psych ROS   GI/Hepatic negative GI ROS, Neg liver ROS,   Endo/Other  Negative Endocrine ROS  Renal/GU negative Renal ROS  Genitourinary negative   Musculoskeletal   Abdominal   Peds  Hematology negative hematology ROS (+)   Anesthesia Other Findings   Reproductive/Obstetrics                          Anesthesia Physical Anesthesia Plan  ASA: I  Anesthesia Plan: General ETT   Post-op Pain Management:    Induction: Intravenous  Airway Management Planned: Oral ETT  Additional Equipment:   Intra-op Plan:   Post-operative Plan: Extubation in OR  Informed Consent: I have reviewed the patients History and Physical, chart, labs and discussed the procedure including the risks, benefits and alternatives for the proposed anesthesia with the patient or authorized representative who has indicated his/her understanding and acceptance.     Plan Discussed with: Anesthesiologist, CRNA and Surgeon  Anesthesia Plan Comments:         Anesthesia Quick Evaluation

## 2011-08-03 NOTE — Preoperative (Signed)
Beta Blockers   Reason not to administer Beta Blockers:Not Applicable 

## 2011-08-03 NOTE — Anesthesia Procedure Notes (Addendum)
Anesthesia Regional Block:  Femoral nerve block  Pre-Anesthetic Checklist: ,, timeout performed, Correct Patient, Correct Site, Correct Laterality, Correct Procedure, Correct Position, site marked, Risks and benefits discussed,  Surgical consent,  Pre-op evaluation,  At surgeon's request and post-op pain management  Laterality: Right  Prep: Maximum Sterile Barrier Precautions used and chloraprep       Needles:  Injection technique: Single-shot  Needle Type: Stimulator Needle - 80        Needle insertion depth: 7 cm   Additional Needles:  Procedures: nerve stimulator Femoral nerve block  Nerve Stimulator or Paresthesia:  Response: 0.5 mA, 0.1 ms,   Additional Responses:   Narrative:  Start time: 08/03/2011 9:40 AM End time: 08/03/2011 9:50 AM Injection made incrementally with aspirations every 5 mL.  Performed by: Personally  Anesthesiologist: Maren Beach MD  Additional Notes: 20cc 0.5% Marcaine w/o difficulty or discomfort GES   Procedure Name: Intubation Date/Time: 08/03/2011 10:45 AM Performed by: Tyrone Nine Pre-anesthesia Checklist: Patient identified, Emergency Drugs available, Suction available and Patient being monitored Patient Re-evaluated:Patient Re-evaluated prior to inductionOxygen Delivery Method: Circle System Utilized Preoxygenation: Pre-oxygenation with 100% oxygen Intubation Type: IV induction Ventilation: Mask ventilation without difficulty Grade View: Grade I Tube type: Oral Tube size: 7.5 mm Number of attempts: 1 Airway Equipment and Method: stylet Placement Confirmation: ETT inserted through vocal cords under direct vision,  positive ETCO2,  CO2 detector and breath sounds checked- equal and bilateral Secured at: 22 cm Tube secured with: Tape Dental Injury: Teeth and Oropharynx as per pre-operative assessment

## 2011-08-03 NOTE — Brief Op Note (Signed)
08/03/2011  1:27 PM  PATIENT:  Rebecca Rojas  55 y.o. female  PRE-OPERATIVE DIAGNOSIS:  DEGENERATIVE JOINT DISEASE RIGHT KNEE  POST-OPERATIVE DIAGNOSIS:  DEGENERATIVE JOINT DISEASE RIGHT KNEE  PROCEDURE:  Procedure(s): TOTAL KNEE ARTHROPLASTY  SURGEON:  Surgeon(s): Harvie Junior  PHYSICIAN ASSISTANT:   ASSISTANTS: bethunes   ANESTHESIA:   general  EBL:  Total I/O In: 2400 [I.V.:2400] Out: 750 [Urine:750]  BLOOD ADMINISTERED:none  DRAINS: (1) Hemovact drain(s) in the r. knee with  Suction Open   LOCAL MEDICATIONS USED:  NONE  SPECIMEN:  No Specimen  DISPOSITION OF SPECIMEN:  N/A  COUNTS:  YES  TOURNIQUET:   Total Tourniquet Time Documented: Thigh (Right) - 94 minutes  DICTATION: .Other Dictation: Dictation Number [id number]  PLAN OF CARE: Admit to inpatient   PATIENT DISPOSITION:  PACU - hemodynamically stable.   Delay start of Pharmacological VTE agent (>24hrs) due to surgical blood loss or risk of bleeding:  {YES/NO/NOT APPLICABLE:20182

## 2011-08-03 NOTE — Progress Notes (Signed)
  ANTICOAGULATION CONSULT NOTE - Initial Consult  Pharmacy Consult for Coumadin Indication: VTE prophylaxis  No Known Allergies  Patient Measurements: Wt 109Kg as of 12/10  Vital Signs: Temp: 97.9 F (36.6 C) (12/14 1700) Temp src: Oral (12/14 1700) BP: 132/77 mmHg (12/14 1700) Pulse Rate: 76  (12/14 1700)  Labs: No results found for this basename: HGB:2,HCT:3,PLT:3,APTT:3,LABPROT:3,INR:3,HEPARINUNFRC:3,CREATININE:3,CKTOTAL:3,CKMB:3,TROPONINI:3 in the last 72 hours The CrCl is unknown because both a height and weight (above a minimum accepted value) are required for this calculation.  Medical History: Past Medical History  Diagnosis Date  . Shortness of breath     maybe with exertion  . Headache     occasional migraine  . Arthritis     osteoarthritis  . History of seasonal allergies     Medications:  Prescriptions prior to admission  Medication Sig Dispense Refill  . etodolac (LODINE) 400 MG tablet Take 400 mg by mouth 2 (two) times daily.        . Fluticasone-Salmeterol (ADVAIR) 250-50 MCG/DOSE AEPB Inhale 1 puff into the lungs every 12 (twelve) hours.        Marland Kitchen levocetirizine (XYZAL) 5 MG tablet Take 5 mg by mouth every evening.        . montelukast (SINGULAIR) 10 MG tablet Take 10 mg by mouth at bedtime.        . SUMAtriptan (IMITREX) 100 MG tablet Take 100 mg by mouth every 2 (two) hours as needed. For migraines        . traMADol (ULTRAM) 50 MG tablet Take 50 mg by mouth every 6 (six) hours as needed. For pain; Maximum dose= 8 tablets per day       . EPINEPHrine (EPIPEN JR) 0.15 MG/0.3ML injection Inject 0.15 mg into the muscle as needed. For allergic reaction       . mometasone (NASONEX) 50 MCG/ACT nasal spray Place 2 sprays into the nose daily.        Marland Kitchen PRESCRIPTION MEDICATION Inject 1 kit into the skin every 7 (seven) days. Allergy shot unknown to pt         Assessment: Patient is s/p R TKA for DJD. Received orders to start Coumadin for VTE px. Noted LFTs and  CBC WNL at baseline lab check -12/10. Not on anticoagulants PTA, INR WNL at baseline. Did not note any orders for Lovenox to start in AM, noted TED hose ordered.  Goal of Therapy:  INR 2-3   Plan: 1. Coumadin 7.5mg  POx 1 today 2. Coumadin book+video to initiate education- will f/up to verify understanding. 3. Will f/up daily INR  4. Will ask MD re: ?[facility] until [employer] K. Allena Katz, PharmD, BCPS.  Clinical Pharmacist Pager [phone]. 08/03/2011 5:37 PM

## 2011-08-03 NOTE — Anesthesia Postprocedure Evaluation (Signed)
  Anesthesia Post-op Note  Patient: Rebecca Rojas  Procedure(s) Performed:  TOTAL KNEE ARTHROPLASTY - COMPUTER ASSISTED TOTAL KNEE REPLACEMENT with revision tibial component  Patient Location: PACU  Anesthesia Type: General  Level of Consciousness: awake  Airway and Oxygen Therapy: Patient Spontanous Breathing  Post-op Pain: mild  Post-op Assessment: Post-op Vital signs reviewed  Post-op Vital Signs: stable  Complications: No apparent anesthesia complications

## 2011-08-03 NOTE — Brief Op Note (Signed)
  08/03/2011  PATIENT:  Rebecca Rojas  55 y.o. female  PRE-OPERATIVE DIAGNOSIS:  DEGENERATIVE JOINT DISEASE RIGHT KNEE  POST-OPERATIVE DIAGNOSIS:  DEGENERATIVE JOINT DISEASE RIGHT KNEE  PROCEDURE:  Procedure(s): TOTAL KNEE ARTHROPLASTY Right computer assisted  SURGEON: Jodi Geralds MD  PHYSICIAN ASSISTANT: Gus Puma PA-C  ANESTHESIA:  GEN   TOURNIQUET:   Total Tourniquet Time Documented: Thigh (Right) - 94 minutes Drain: Hemovac x 1 To PACU IN STABLE CONDITION.   Rebecca Rojas 08/03/2011, 1:35 PM

## 2011-08-03 NOTE — Progress Notes (Signed)
Call to dr. Jacklynn Bue to sign patient out since pt not signed out by dr. Katrinka Blazing prior to his leaving for the day.  He said patient could go but did not have time to sign pt out at this time.

## 2011-08-04 ENCOUNTER — Other Ambulatory Visit: Payer: Self-pay

## 2011-08-04 LAB — BASIC METABOLIC PANEL
BUN: 9 mg/dL (ref 6–23)
Calcium: 7.8 mg/dL — ABNORMAL LOW (ref 8.4–10.5)
Chloride: 103 mEq/L (ref 96–112)
Creatinine, Ser: 0.69 mg/dL (ref 0.50–1.10)
GFR calc Af Amer: >90 mL/min (ref >90)
GFR calc non Af Amer: >90 mL/min (ref >90)

## 2011-08-04 LAB — CBC
HCT: 31.7 % — ABNORMAL LOW (ref 36.0–46.0)
MCHC: 31.5 g/dL (ref 30.0–36.0)
MCV: 90.1 fL (ref 78.0–100.0)
Platelets: 217 10*3/uL (ref 150–400)
RDW: 13.9 % (ref 11.5–15.5)
WBC: 7.9 10*3/uL (ref 4.0–10.5)

## 2011-08-04 LAB — PROTIME-INR: INR: 1.14 (ref 0.00–1.49)

## 2011-08-04 MED ORDER — WARFARIN SODIUM 7.5 MG PO TABS
7.5000 mg | ORAL_TABLET | Freq: Once | ORAL | Status: AC
  Administered 2011-08-04: 7.5 mg via ORAL
  Filled 2011-08-04 (×4): qty 1

## 2011-08-04 MED ORDER — MORPHINE SULFATE (PF) 1 MG/ML IV SOLN
INTRAVENOUS | Status: AC
  Filled 2011-08-04: qty 25

## 2011-08-04 NOTE — Op Note (Signed)
NAME:  Rebecca Rojas, Rebecca Rojas NO.:  [id number]  MEDICAL RECORD NO.:  [id number]  LOCATION:  5038                         FACILITY:  [facility]  PHYSICIAN:  Harvie Junior, M.D.   DATE OF BIRTH:  1956/06/29  DATE OF PROCEDURE:  08/03/2011 DATE OF DISCHARGE:                              OPERATIVE REPORT   PREOPERATIVE DIAGNOSES: 1. End-stage degenerative joint disease. 2. Morbid obesity.  POSTOPERATIVE DIAGNOSES: 1. End-stage degenerative joint disease. 2. Morbid obesity.  PROCEDURE: 1. Right total knee replacement with a Sigma system, size 2.5 femur,     size 2.5 tibia with an MBT revision tray, 32-mm all polyethylene     patella, and a 12.5-mm bridging bearing. 2. Computer-assisted right total knee replacement. 3. Additional difficulty related to obesity.  BRIEF HISTORY:  Rebecca Rojas is a 55 year old female with a long history of having had significant right knee pain.  She had been treated conservatively for a long period of time with activity modification, use of a cane, anti-inflammatory medication.  She failed all these things. Preoperative x-ray showed bone-on-bone degenerative changes.  Because of the failure of all conservative care, she was ultimately taken to the operating room for a total knee replacement.  Because of her large size, computer assistance was chosen to be used preoperatively and this was used during the case.  She felt that this would be a difficult case with her morbid obesity and got an extra scrub to come into the room, and she was brought to the operating room for this procedure.  PROCEDURE:  The patient was brought to the operating room.  After adequate anesthesia was obtained with general anesthetic, the patient was placed supine on the operating table.  The right leg was then prepped and draped in usual sterile fashion.  Following this, the leg was exsanguinated and blood pressure tourniquet inflated to 300 mmHg. Following this, a  midline incision was made in the subcutaneous tissue down to the level of the extensor mechanism.  Medial parapatellar arthrotomy was undertaken.  Once this was done, attention was turned to the removal of the medial and lateral meniscus, retropatellar fat pad, anterior and posterior cruciates and the synovium on the anterior aspect of the femur.  At this point, small incisions were made for placement of the guide for the computer.  Two pins were placed in the tibia  with some difficulty given her large size.  Two pins placed in the femur again with difficulty given her large size.  Once this was done, the tibia was exposed somewhat difficult because of the large size and ultimately once the retraction was put in place, the tibia was cut perpendicular to its long axis under computer assistance.  Once this was done, attention was turned to the femur which was cut perpendicular to the anatomic axis with cutting guides,  again under some difficulty given her large size.  Once this was completed, attention was turned towards putting in a spacer block, and a 12.5 spacer block gave [place] good neutral long alignment and gap balance.  Attention was then turned to the femur which was sized, again somewhat difficult even get to guide them because of  her large size and ultimately, we were able to size it to a 2.5.  A 2.5 block was chosen and this gave [place] access to making anterior and posterior cuts, chamfer cuts and box cut.  Once this was done, attention was turned to the tibia which was then drilled and keeled and we went with an MBT revision stem because of her large size, and this was created definitely with difficulty having to get down into the stem.  Once the trials were placed, the knee was put through a range of motion felt to be stable in all directions.  Computer showed [place] good neutral long alignment.  Attention turned to the patella which was cut down to a level of 13 mm.  A 32 all poly was  chosen and this was put in place.  Trials were then removed.  The knee was again carefully exposed under some difficultly.  Once this was completed, the trial components were cemented in place, size 2.5 femur, size 2.5 MBT revision tray tibia, a 12.5-mm bridging-bearing trial, and a 32 mm all poly patella. Cement was allowed to harden.  Once this was done, the tourniquet was let down.  All bleeding was controlled with electrocautery.  At this point, the closure was undertaken.  The final components have been cemented into place, and then the final poly was now placed.  All excess bone cement had been removed.  At this point, the medial parapatellar arthrotomy was closed with 1 Vicryl running, skin with 0 and 2-0 Vicryl, and then staples.  Sterile compressive dressing was applied, and the patient taken to the recovery room where she was noted to be in satisfactory condition.  Difficulty during the case was encountered at every step relative to the obesity of the patient certainly added tremendous time and difficulty of the surgical procedure undoubtedly will add some complexity to the postoperative management of the patient. Rebecca Rojas assisted throughout the case and was critical in his performance.  The estimated blood loss for the procedure was less than 50 mL.     Harvie Junior, M.D.     Rebecca Rojas  D:  08/03/2011  T:  08/04/2011  Job:  [id number]

## 2011-08-04 NOTE — Progress Notes (Signed)
Physical Therapy Evaluation Patient Details Name: Rebecca Rojas MRN: [id number] DOB: [birth year] Today's Date: 08/04/2011  Problem List:  Patient Active Problem List  Diagnoses  . PULMONARY NODULE    Past Medical History:  Past Medical History  Diagnosis Date  . Shortness of breath     maybe with exertion  . Headache     occasional migraine  . Arthritis     osteoarthritis  . History of seasonal allergies    Past Surgical History:  Past Surgical History  Procedure Date  . Tonsillectomy   . Fracture surgery     left 5th finger fx with bone graft repair  . Abdominal hysterectomy   . Tubal ligation     PT Assessment/Plan/Recommendation PT Assessment Clinical Impression Statement: 55 yo female s/p RTKA presents with decr functional mobility and impairments listed below; will benefit from PT services to maximize independence and safety with mobility, amb, steps to enable safe dc home PT Recommendation/Assessment: Patient will need skilled PT in the acute care venue PT Problem List: Decreased strength;Decreased range of motion;Decreased mobility;Decreased knowledge of use of DME;Pain PT Therapy Diagnosis : Difficulty walking;Acute pain PT Plan PT Frequency: 7X/week PT Treatment/Interventions: DME instruction;Gait training;Stair training;Functional mobility training;Therapeutic activities;Therapeutic exercise;Patient/family education PT Recommendation Recommendations for Other Services: OT consult Follow Up Recommendations: Home health PT;24 hour supervision/assistance Equipment Recommended: None recommended by PT PT Goals  Acute Rehab PT Goals PT Goal Formulation: With patient Time For Goal Achievement: 7 days Pt will go Supine/Side to Sit: with modified independence Pt will go Sit to Supine/Side: with modified independence Pt will go Sit to Stand: with modified independence Pt will go Stand to Sit: with modified independence Pt will Ambulate: >150 feet;with  supervision;with rolling walker Pt will Go Up / Down Stairs: 1-2 stairs;with min assist;with rolling walker (one plus one step) Pt will Perform Home Exercise Program: Independently  PT Evaluation Precautions/Restrictions  Precautions Precautions: Knee Required Braces or Orthoses:  (KI in room, used with amb) Restrictions Weight Bearing Restrictions: Yes RLE Weight Bearing: Weight bearing as tolerated Prior Functioning  Home Living Lives With: Spouse Receives Help From: Family Type of Home: House Home Layout: One level Home Access: Stairs to enter Entrance Stairs-Rails: None Entrance Stairs-Number of Steps: 1 (One to porch then one into home) Home Adaptive Equipment: Bedside commode/3-in-1;Walker - rolling Prior Function Level of Independence: Independent with homemaking with ambulation Cognition Cognition Arousal/Alertness: Awake/alert Overall Cognitive Status: Appears within functional limits for tasks assessed Orientation Level: Oriented X4 Sensation/Coordination Sensation Light Touch: Appears Intact Coordination Gross Motor Movements are Fluid and Coordinated: Yes Extremity Assessment RUE Assessment RUE Assessment: Within Functional Limits LUE Assessment LUE Assessment: Within Functional Limits RLE Assessment RLE Assessment: Exceptions to [facility] RLE Strength RLE Overall Strength Comments: grossly decr AROM and strength, limited by pain; positive quad activation, though weak; AAROM approx 3 to 45 degrees LLE Assessment LLE Assessment: Within Functional Limits Mobility (including Balance) Bed Mobility Bed Mobility: Yes Supine to Sit: 4: Min assist;With rails;HOB flat Supine to Sit Details (indicate cue type and reason): cues for technique and to activate quad (inside KI) for knee control Sitting - Scoot to Edge of Bed: 4: Min assist Sitting - Scoot to [place] of Bed Details (indicate cue type and reason): good, safe technique Transfers Transfers: Yes Sit to Stand: 4:  Min assist;From bed;With upper extremity assist Sit to Stand Details (indicate cue type and reason): cues for safe hand placement and prepositioning for comfort Stand to Sit: 4: Min assist;With upper  extremity assist;With armrests Stand to Sit Details: cues for control safe hand placement Ambulation/Gait Ambulation/Gait: Yes Ambulation/Gait Assistance: 4: Min assist (second person to push chair behind just in case eminent need) Ambulation/Gait Assistance Details (indicate cue type and reason): cues for sequence; and self monitor for dizzinesss Ambulation Distance (Feet): 45 Feet Assistive device: Rolling walker Gait Pattern: Step-to pattern (progressing to step-through)    Exercise  Total Joint Exercises Quad Sets: AROM;Right;10 reps;Supine Heel Slides: AAROM;Right;5 reps;Supine End of Session PT - End of Session Equipment Utilized During Treatment: Gait belt Activity Tolerance: Patient tolerated treatment well Patient left: with call bell in reach (On 3in1, nursing aware) Nurse Communication: Mobility status for transfers;Mobility status for ambulation General Behavior During Session: [facility] for tasks performed Cognition: [facility] for tasks performed  Van Clines [facility] [place], [place] [phone]  08/04/2011, 10:44 AM

## 2011-08-04 NOTE — Progress Notes (Signed)
PATIENT ID: Rebecca Rojas  MRN: [id number]  DOB/AGE:  Nov 02, 1955 / 55 y.o.  1 Day Post-Op Procedure(s) (LRB): TOTAL KNEE ARTHROPLASTY (Right)    PROGRESS NOTE Subjective: Patient is alert, oriented, no Nausea, no Vomiting, yes passing gas, no Bowel Movement. Taking PO well. Denies SOB, Chest or Calf Pain. Using Incentive Spirometer, PAS in place. Has not ambulated yet.  Patient reports pain as moderate  .    Objective: Vital signs in last 24 hours: Filed Vitals:   08/04/11 0142 08/04/11 0435 08/04/11 0524 08/04/11 0543  BP:    108/59  Pulse:    76  Temp:    98 F (36.7 C)  TempSrc:    Oral  Resp:  20 20 20   SpO2: 98% 96% 100% 100%      Intake/Output from previous day: I/O last 3 completed shifts: In: 4130 [P.O.:480; I.V.:3350; Other:300] Out: 1825 [Urine:1600; Drains:150; Blood:75]   Intake/Output this shift:     LABORATORY DATA:  Basename 08/04/11 0530  WBC 7.9  HGB 10.0*  HCT 31.7*  PLT 217  NA 137  K 4.3  CL 103  CO2 29  BUN 9  CREATININE 0.69  GLUCOSE 131*  GLUCAP --  INR 1.14  CALCIUM 7.8*    Examination: Neurologically intact ABD soft Neurovascular intact Sensation intact distally Incision: dressing C/D/I}  Assessment:   1 Day Post-Op Procedure(s) (LRB): TOTAL KNEE ARTHROPLASTY (Right) ADDITIONAL DIAGNOSIS:  none  Plan: PT/OT WBAT, CPM 5/hrs day until ROM 0-90 degrees, then D/C CPM DVT Prophylaxis:  Coumadin target INR 1.5-2.0 DISCHARGE PLAN: Home DISCHARGE NEEDS: HHPT, HHRN, CPM, Walker and 3-in-1 comode seat     Rebecca Rojas M. 08/04/2011, 7:50 AM

## 2011-08-04 NOTE — Progress Notes (Signed)
ANTICOAGULATION CONSULT NOTE - Follow Up Consult  Pharmacy Consult for Coumadin Indication: VTE prophylaxis  Assessment: 55 yo F s/p R TKA on coumadin for VTE prophylaxis.  INR 1.14, no bleeding issues noted.  Goal of Therapy:  INR 1.5-2 (per PA note)   Plan:  Coumadin 7.5mg  PO x1 today. Check INR in AM.  Ival Bible 08/04/2011,11:52 AM  No Known Allergies  Vital Signs: Temp: 98 F (36.7 C) (12/15 0543) Temp src: Oral (12/15 0543) BP: 108/59 mmHg (12/15 0543) Pulse Rate: 76  (12/15 0543)  Labs:  Basename 08/04/11 0530  HGB 10.0*  HCT 31.7*  PLT 217  APTT --  LABPROT 14.8  INR 1.14  HEPARINUNFRC --  CREATININE 0.69  CKTOTAL --  CKMB --  TROPONINI --   The CrCl is unknown because both a height and weight (above a minimum accepted value) are required for this calculation.   Medications:  Scheduled:    . acetaminophen  1,000 mg Intravenous Q6H  . albuterol  2.5 mg Nebulization Q6H  . ceFAZolin (ANCEF) IV  2 g Intravenous Q6H  . coumadin book   Does not apply Once  . ferrous sulfate  325 mg Oral BID WC  . fluticasone  2 spray Each Nare Daily  . Fluticasone-Salmeterol  1 puff Inhalation Q12H  . loratadine  10 mg Oral QHS  . montelukast  10 mg Oral QHS  . morphine   Intravenous Q4H  . warfarin  7.5 mg Oral ONCE-1800  . warfarin   Does not apply Once  . DISCONTD: levocetirizine  5 mg Oral QPM

## 2011-08-05 LAB — BASIC METABOLIC PANEL
CO2: 28 mEq/L (ref 19–32)
Calcium: 8.1 mg/dL — ABNORMAL LOW (ref 8.4–10.5)
Creatinine, Ser: 0.59 mg/dL (ref 0.50–1.10)
GFR calc Af Amer: >90 mL/min (ref >90)
GFR calc non Af Amer: >90 mL/min (ref >90)
Sodium: 138 mEq/L (ref 135–145)

## 2011-08-05 LAB — CBC
MCH: 29.2 pg (ref 26.0–34.0)
MCHC: 32.6 g/dL (ref 30.0–36.0)
MCV: 89.6 fL (ref 78.0–100.0)
Platelets: 224 10*3/uL (ref 150–400)
RBC: 3.56 MIL/uL — ABNORMAL LOW (ref 3.87–5.11)
RDW: 14 % (ref 11.5–15.5)

## 2011-08-05 LAB — PROTIME-INR: Prothrombin Time: 16.5 seconds — ABNORMAL HIGH (ref 11.6–15.2)

## 2011-08-05 MED ORDER — WARFARIN SODIUM 7.5 MG PO TABS
7.5000 mg | ORAL_TABLET | Freq: Once | ORAL | Status: AC
  Administered 2011-08-05: 7.5 mg via ORAL
  Filled 2011-08-05: qty 1

## 2011-08-05 MED ORDER — WARFARIN SODIUM 5 MG PO TABS: 5.0000 mg | ORAL_TABLET | Freq: Every day | ORAL | Status: AC

## 2011-08-05 MED ORDER — OXYCODONE-ACETAMINOPHEN 5-325 MG PO TABS: 1.0000 | ORAL_TABLET | Freq: Four times a day (QID) | ORAL | Status: AC | PRN

## 2011-08-05 NOTE — Progress Notes (Signed)
ANTICOAGULATION CONSULT NOTE - Follow Up Consult  Pharmacy Consult for Coumadin Indication: VTE prophylaxis  Assessment: 55 yo female s/p R TKA on coumadin for VTE prophylaxis.  INR 1.31, no bleeding issues reported.  Goal of Therapy:  INR 1.5-2   Plan:  Coumadin 7.5mg  PO x1 today. Check INR in AM.  Ival Bible 08/05/2011,1:41 PM  No Known Allergies  Vital Signs: Temp: 98.7 F (37.1 C) (12/16 0632) Temp src: Oral (12/16 0632) BP: 131/81 mmHg (12/16 0632) Pulse Rate: 98  (12/16 0632)  Labs:  Basename 08/05/11 1210 08/04/11 0530  HGB 10.4* 10.0*  HCT 31.9* 31.7*  PLT 224 217  APTT -- --  LABPROT 16.5* 14.8  INR 1.31 1.14  HEPARINUNFRC -- --  CREATININE 0.59 0.69  CKTOTAL -- --  CKMB -- --  TROPONINI -- --   The CrCl is unknown because both a height and weight (above a minimum accepted value) are required for this calculation.   Medications:  Scheduled:    . albuterol  2.5 mg Nebulization Q6H  . ferrous sulfate  325 mg Oral BID WC  . fluticasone  2 spray Each Nare Daily  . Fluticasone-Salmeterol  1 puff Inhalation Q12H  . loratadine  10 mg Oral QHS  . montelukast  10 mg Oral QHS  . morphine   Intravenous Q4H  . warfarin  7.5 mg Oral ONCE-1800

## 2011-08-05 NOTE — Progress Notes (Signed)
Physical Therapy Treatment Patient Details Name: Rebecca Rojas MRN: [id number] DOB: [birth year] Today's Date: 08/05/2011  PT Assessment/Plan  PT - Assessment/Plan Comments on Treatment Session: good improvements; on track for dc home tomottow PT Plan: Discharge plan remains appropriate PT Frequency: 7X/week Follow Up Recommendations: Home health PT;24 hour supervision/assistance Equipment Recommended: None recommended by PT PT Goals  Acute Rehab PT Goals PT Goal Formulation: With patient Pt will go Supine/Side to Sit: with modified independence PT Goal: Supine/Side to Sit - Progress: Progressing toward goal Pt will go Sit to Supine/Side: with modified independence PT Goal: Sit to Supine/Side - Progress: Progressing toward goal Pt will go Sit to Stand: with modified independence PT Goal: Sit to Stand - Progress: Progressing toward goal Pt will go Stand to Sit: with modified independence PT Goal: Stand to Sit - Progress: Progressing toward goal Pt will Ambulate: >150 feet;with supervision;with rolling walker PT Goal: Ambulate - Progress: Progressing toward goal Pt will Go Up / Down Stairs: 1-2 stairs;with min assist;with rolling walker PT Goal: Up/Down Stairs - Progress: Progressing toward goal Pt will Perform Home Exercise Program: Independently  PT Treatment Precautions/Restrictions  Precautions Precautions: Knee Required Braces or Orthoses:  (R knee did not buckle with amb witout KI) Restrictions Weight Bearing Restrictions: Yes RLE Weight Bearing: Weight bearing as tolerated Mobility (including Balance) Transfers Sit to Stand: 4: Min assist (without physical contact) Sit to Stand Details (indicate cue type and reason): improving! good hand placement and safe transfer Stand to Sit: 4: Min assist;With upper extremity assist;With armrests Stand to Sit Details: cues to preposition for comfort Ambulation/Gait Ambulation/Gait Assistance: 4: Min assist (without physical  contact) Ambulation/Gait Assistance Details (indicate cue type and reason): cues to activate Right quad for stance stability Ambulation Distance (Feet): 110 Feet Assistive device: Rolling walker Gait Pattern: Step-to pattern Stairs: Yes Stairs Assistance: 4: Min assist Stairs Assistance Details (indicate cue type and reason): cues for sequence, safe technique and RW management Stair Management Technique: No rails;Backwards;With walker Number of Stairs: 1     Exercise    End of Session PT - End of Session Equipment Utilized During Treatment: Gait belt Activity Tolerance: Patient tolerated treatment well Patient left: with call bell in reach (in bathroom) General Behavior During Session: [facility] for tasks performed Cognition: [facility] for tasks performed  Van Clines [facility] [place], [place] [phone]  08/05/2011, 11:25 AM

## 2011-08-05 NOTE — Progress Notes (Signed)
PATIENT ID: Rebecca Rojas  MRN: [id number]  DOB/AGE:  1955-10-03 / 55 y.o.  2 Days Post-Op Procedure(s) (LRB): TOTAL KNEE ARTHROPLASTY (Right)    PROGRESS NOTE Subjective: Patient is alert, oriented, no Nausea, no Vomiting, yes passing gas, no Bowel Movement. Taking PO well. Denies SOB, Chest or Calf Pain. Using Incentive Spirometer, PAS in place. Ambulating well. Patient reports pain as mild  .    Objective: Vital signs in last 24 hours: Filed Vitals:   08/05/11 0400 08/05/11 0632 08/05/11 0800 08/05/11 1020  BP:  131/81    Pulse:  98    Temp:  98.7 F (37.1 C)    TempSrc:  Oral    Resp: 20 20 20    SpO2: 98% 98% 100% 99%      Intake/Output from previous day: I/O last 3 completed shifts: In: 1980 [P.O.:1080; I.V.:900] Out: 1100 [Urine:850; Drains:250]   Intake/Output this shift: Total I/O In: 150 [P.O.:150] Out: -    LABORATORY DATA:  Basename 08/04/11 0530  WBC 7.9  HGB 10.0*  HCT 31.7*  PLT 217  NA 137  K 4.3  CL 103  CO2 29  BUN 9  CREATININE 0.69  GLUCOSE 131*  GLUCAP --  INR 1.14  CALCIUM 7.8*    Examination: Neurologically intact ABD soft Neurovascular intact Sensation intact distally Incision: dressing C/D/I} - drain pulled today  Assessment:   2 Days Post-Op Procedure(s) (LRB): TOTAL KNEE ARTHROPLASTY (Right) ADDITIONAL DIAGNOSIS:  none  Plan: PT/OT WBAT, CPM 5/hrs day until ROM 0-90 degrees, then D/C CPM DVT Prophylaxis:  Lovenox\Coumadin bridge target INR 1.5-2.0 DISCHARGE PLAN: Home Monday DISCHARGE NEEDS: HHPT, HHRN, CPM, Walker and 3-in-1 comode seat     Reichen Hutzler M. 08/05/2011, 12:17 PM

## 2011-08-06 LAB — CBC
HCT: 30.1 % — ABNORMAL LOW (ref 36.0–46.0)
Hemoglobin: 9.5 g/dL — ABNORMAL LOW (ref 12.0–15.0)
MCH: 28.1 pg (ref 26.0–34.0)
MCHC: 31.6 g/dL (ref 30.0–36.0)
MCV: 89.1 fL (ref 78.0–100.0)
Platelets: 231 10*3/uL (ref 150–400)
RBC: 3.38 MIL/uL — ABNORMAL LOW (ref 3.87–5.11)
RDW: 14.2 % (ref 11.5–15.5)
WBC: 9.1 10*3/uL (ref 4.0–10.5)

## 2011-08-06 NOTE — Progress Notes (Signed)
Subjective: 3 Days Post-Op Procedure(s) (LRB): TOTAL KNEE ARTHROPLASTY (Right) Patient reports pain as 4 on 0-10 scale.    Objective: Vital signs in last 24 hours: Temp:  [98.6 F (37 C)-98.8 F (37.1 C)] 98.8 F (37.1 C) (12/17 0633) Pulse Rate:  [98-109] 106  (12/17 0633) Resp:  [18-24] 20  (12/17 0633) BP: (130-143)/(60-68) 130/68 mmHg (12/17 0633) SpO2:  [93 %-100 %] 93 % (12/17 4098)  Intake/Output from previous day: 12/16 0701 - 12/17 0700 In: 275 [P.O.:275] Out: -  Intake/Output this shift:     Basename 08/06/11 0615 08/05/11 1210 08/04/11 0530  HGB 9.5* 10.4* 10.0*    Basename 08/06/11 0615 08/05/11 1210  WBC 9.1 10.5  RBC 3.38* 3.56*  HCT 30.1* 31.9*  PLT 231 224    Basename 08/05/11 1210 08/04/11 0530  NA 138 137  K 3.6 4.3  CL 104 103  CO2 28 29  BUN 8 9  CREATININE 0.59 0.69  GLUCOSE 120* 131*  CALCIUM 8.1* 7.8*    Basename 08/06/11 0615 08/05/11 1210  LABPT -- --  INR 1.53* 1.31    Neurologically intact ABD soft Neurovascular intact Sensation intact distally Intact pulses distally Dorsiflexion/Plantar flexion intact No cellulitis present Compartment soft  Assessment/Plan: 3 Days Post-Op Procedure(s) (LRB): TOTAL KNEE ARTHROPLASTY (Right) Advance diet Up with therapy Discharge home with home health  Rebecca Rojas L 08/06/2011, 7:29 AM

## 2011-08-06 NOTE — Progress Notes (Signed)
Physical Therapy Treatment Patient Details Name: Rebecca Rojas MRN: [id number] DOB: 1956/01/05 Today's Date: 08/06/2011  PT Assessment/Plan  PT - Assessment/Plan Comments on Treatment Session: also discussed car transfer; very painful, but performing well; pt reports no questions re: amb, transfers and therex (provided handout); ok for dc home from PT standpoint PT Plan: Discharge plan remains appropriate PT Frequency: 7X/week Follow Up Recommendations: Home health PT;24 hour supervision/assistance Equipment Recommended: None recommended by PT PT Goals  Acute Rehab PT Goals Time For Goal Achievement: 7 days Pt will go Supine/Side to Sit: with modified independence PT Goal: Supine/Side to Sit - Progress: Progressing toward goal Pt will go Sit to Supine/Side: with modified independence PT Goal: Sit to Supine/Side - Progress: Progressing toward goal Pt will go Sit to Stand: with modified independence PT Goal: Sit to Stand - Progress: Progressing toward goal Pt will go Stand to Sit: with modified independence PT Goal: Stand to Sit - Progress: Progressing toward goal Pt will Ambulate: >150 feet;with supervision;with rolling walker PT Goal: Ambulate - Progress: Progressing toward goal Pt will Go Up / Down Stairs: 1-2 stairs;with min assist;with rolling walker PT Goal: Up/Down Stairs - Progress:  (NT today) Pt will Perform Home Exercise Program: Independently PT Goal: Perform Home Exercise Program - Progress: Progressing toward goal  PT Treatment Precautions/Restrictions  Precautions Precautions: Knee Required Braces or Orthoses:  (R knee did not buckle with amb witout KI) Restrictions Weight Bearing Restrictions: Yes RLE Weight Bearing: Weight bearing as tolerated Mobility (including Balance) Bed Mobility Bed Mobility: Yes Sit to Supine - Right: 4: Min assist (for Right LE) Sit to Supine - Right Details (indicate cue type and reason): cues for technique, and to use sheet roll to  help Right LE into bed; pt states her family has been able to help her in and out of bed while in hospital Transfers Sit to Stand: 5: Supervision Sit to Stand Details (indicate cue type and reason): good, safe hand placement Stand to Sit: 5: Supervision Stand to Sit Details: good controof stand to sit to commoed and to bed Ambulation/Gait Ambulation/Gait Assistance: 4: Min assist (without physical contact) Ambulation/Gait Assistance Details (indicate cue type and reason): cues to activate righ quad for stance stability Ambulation Distance (Feet): 35 Feet Assistive device: Rolling walker Gait Pattern: Step-to pattern (good right stance stability)    Exercise  Total Joint Exercises Quad Sets: AROM;Right;10 reps;Supine Short Arc Quad: AAROM;Right;10 reps;Supine Heel Slides: AAROM;Right;5 reps;Supine (limited by pain) Straight Leg Raises: AAROM;Right;10 reps;Supine End of Session PT - End of Session Equipment Utilized During Treatment: Gait belt Activity Tolerance: Patient tolerated treatment well Patient left: in bed;in CPM;with call bell in reach General Cognition: [facility] for tasks performed  Van Clines [facility] [place], [place] [phone]  08/06/2011, 8:16 AM

## 2011-08-06 NOTE — Progress Notes (Signed)
OT NOTE OT order received and appreciated.  At this time, pt states that she has necessary equipment and assistance for safe d/c home. Pt performing ADLs/ functional transfers at min A/supervision level at this time per PT and pt report. Pt reports that she feels she does not need acute OT. Pt plans to d/c home today. No acute OT needed.  08/06/2011 Cipriano Mile OTR/L Pager [phone] Office [phone]

## 2011-08-07 ENCOUNTER — Encounter (HOSPITAL_COMMUNITY): Payer: Self-pay | Admitting: Orthopedic Surgery

## 2011-08-25 NOTE — Discharge Summary (Signed)
Patient ID: Rebecca Rojas MRN: [id number] DOB/AGE: [birth year] 56 y.o.  Admit date: 08/03/2011 Discharge date:08/06/11 Admission Diagnoses:  Osteoarthritis Right knee  Discharge Diagnoses:  Same  Past Medical History  Diagnosis Date  . Shortness of breath     maybe with exertion  . Headache     occasional migraine  . Arthritis     osteoarthritis  . History of seasonal allergies     Surgeries: Procedure(s):Right TOTAL KNEE ARTHROPLASTY on 08/03/2011   Consultants:  none  Discharged Condition: Improved  Hospital Course: Rebecca Rojas is an 56 y.o. female who was admitted 08/03/2011 for operative treatment ofosteoarthritis of the right knee with bone on bone arthritis. Patient has severe unremitting pain that affects sleep, daily activities, and work/hobbies. After pre-op clearance the patient was taken to the operating room on 08/03/2011 and underwent  Procedure(s):Right TOTAL KNEE ARTHROPLASTY.    Patient was given perioperative antibiotics:  Anti-infectives     Start     Dose/Rate Route Frequency Ordered Stop   08/03/11 1800   ceFAZolin (ANCEF) IVPB 2 g/50 mL premix        2 g 100 mL/hr over 30 Minutes Intravenous Every 6 hours 08/03/11 1724 08/04/11 0631   08/03/11 1114   cefUROXime (ZINACEF) injection  Status:  Discontinued          As needed 08/03/11 1114 08/03/11 1342           Patient was given sequential compression devices, early ambulation, and chemoprophylaxis to prevent DVT.  Patient benefited maximally from hospital stay and there were no complications.     Discharge Medications:   Discharge Medication List as of 08/06/2011  5:15 PM    START taking these medications   Details  oxyCODONE-acetaminophen (PERCOCET) 5-325 MG per tablet Take 1-2 tablets by mouth every 6 (six) hours as needed for pain., Starting 08/05/2011, Until Wed 08/15/11, No Print    warfarin (COUMADIN) 5 MG tablet Take 1 tablet (5 mg total) by mouth daily., Starting 08/05/2011,  Until Mon 08/04/12, No Print      CONTINUE these medications which have NOT CHANGED   Details  Fluticasone-Salmeterol (ADVAIR) 250-50 MCG/DOSE AEPB Inhale 1 puff into the lungs every 12 (twelve) hours.  , Until Discontinued, Historical Med    levocetirizine (XYZAL) 5 MG tablet Take 5 mg by mouth every evening.  , Until Discontinued, Historical Med    montelukast (SINGULAIR) 10 MG tablet Take 10 mg by mouth at bedtime.  , Until Discontinued, Historical Med    SUMAtriptan (IMITREX) 100 MG tablet Take 100 mg by mouth every 2 (two) hours as needed. For migraines  , Until Discontinued, Historical Med    traMADol (ULTRAM) 50 MG tablet Take 50 mg by mouth every 6 (six) hours as needed. For pain; Maximum dose= 8 tablets per day , Until Discontinued, Historical Med    EPINEPHrine (EPIPEN JR) 0.15 MG/0.3ML injection Inject 0.15 mg into the muscle as needed. For allergic reaction , Until Discontinued, Historical Med    mometasone (NASONEX) 50 MCG/ACT nasal spray Place 2 sprays into the nose daily.  , Until Discontinued, Historical Med    PRESCRIPTION MEDICATION Inject 1 kit into the skin every 7 (seven) days. Allergy shot unknown to pt , Until Discontinued, Historical Med      STOP taking these medications     etodolac (LODINE) 400 MG tablet         Diagnostic Studies: Dg Chest 2 View  07/30/2011  *RADIOLOGY REPORT*  Clinical  Data: Preop knee arthroplasty  CHEST - 2 VIEW  Comparison: None.  Findings: Heart size is upper normal.  Negative for heart failure. Negative for infiltrate or mass lesion.  Lungs are clear  IMPRESSION: No active cardiopulmonary disease.  Original Report Authenticated By: Camelia Phenes, M.D.    Disposition: Home-Health Care Svc  Discharge Orders    Future Orders Please Complete By Expires   Diet general      Diet - low sodium heart healthy      Call MD / Call 911      Comments:   If you experience chest pain or shortness of breath, CALL 911 and be transported to  the hospital emergency room.  If you develope a fever above 101 F, pus (white drainage) or increased drainage or redness at the wound, or calf pain, call your surgeon's office.   Weight Bearing as taught in Physical Therapy      Comments:   Use a walker or crutches as instructed.   CPM      Comments:   Continuous passive motion machine (CPM):      Use the CPM from0 to 60 for 8 hours per day.      You may increase by 10 degrees per day.  You may break it up into 2 or 3 sessions per day.      Use CPM for 1-2 weeks or until you are told to stop.   Do not put a pillow under the knee. Place it under the heel.      Call MD / Call 911      Comments:   If you experience chest pain or shortness of breath, CALL 911 and be transported to the hospital emergency room.  If you develope a fever above 101 F, pus (white drainage) or increased drainage or redness at the wound, or calf pain, call your surgeon's office.   Constipation Prevention      Comments:   Drink plenty of fluids.  Prune juice may be helpful.  You may use a stool softener, such as Colace (over the counter) 100 mg twice a day.  Use MiraLax (over the counter) for constipation as needed.   Increase activity slowly as tolerated      Weight Bearing as taught in Physical Therapy      Comments:   Use a walker or crutches as instructed.   Driving restrictions      Comments:   No driving for 3-4 weeks   CPM      Comments:   Continuous passive motion machine (CPM):      Use the CPM from 45 to 60 for 8 hours per day.      You may increase by 10 per day.  You may break it up into 2 or 3 sessions per day.      Use CPM for 3-4 weeks or until you are told to stop.      Follow-up Information    Follow up with GRAVES,JOHN L in 2 weeks.   Contact information:   [address] [place] [address] [phone]           Signed: Matthew Folks 08/25/2011, 2:39 PM

## 2013-02-11 ENCOUNTER — Emergency Department (HOSPITAL_BASED_OUTPATIENT_CLINIC_OR_DEPARTMENT_OTHER)
Admission: EM | Admit: 2013-02-11 | Discharge: 2013-02-11 | Disposition: A | Discharge status: Home / Self Care | Payer: BC Managed Care – PPO | Attending: Emergency Medicine | Admitting: Emergency Medicine

## 2013-02-11 ENCOUNTER — Emergency Department (HOSPITAL_BASED_OUTPATIENT_CLINIC_OR_DEPARTMENT_OTHER): Payer: BC Managed Care – PPO

## 2013-02-11 ENCOUNTER — Encounter (HOSPITAL_BASED_OUTPATIENT_CLINIC_OR_DEPARTMENT_OTHER): Payer: Self-pay | Admitting: Family Medicine

## 2013-02-11 DIAGNOSIS — J329 Chronic sinusitis, unspecified: Secondary | ICD-10-CM | POA: Insufficient documentation

## 2013-02-11 DIAGNOSIS — R42 Dizziness and giddiness: Secondary | ICD-10-CM | POA: Insufficient documentation

## 2013-02-11 DIAGNOSIS — IMO0002 Reserved for concepts with insufficient information to code with codable children: Secondary | ICD-10-CM | POA: Insufficient documentation

## 2013-02-11 DIAGNOSIS — Z8739 Personal history of other diseases of the musculoskeletal system and connective tissue: Secondary | ICD-10-CM | POA: Insufficient documentation

## 2013-02-11 DIAGNOSIS — Z8709 Personal history of other diseases of the respiratory system: Secondary | ICD-10-CM | POA: Insufficient documentation

## 2013-02-11 DIAGNOSIS — Z8669 Personal history of other diseases of the nervous system and sense organs: Secondary | ICD-10-CM | POA: Insufficient documentation

## 2013-02-11 DIAGNOSIS — G5620 Lesion of ulnar nerve, unspecified upper limb: Secondary | ICD-10-CM | POA: Insufficient documentation

## 2013-02-11 DIAGNOSIS — R209 Unspecified disturbances of skin sensation: Secondary | ICD-10-CM | POA: Insufficient documentation

## 2013-02-11 DIAGNOSIS — Z79899 Other long term (current) drug therapy: Secondary | ICD-10-CM | POA: Insufficient documentation

## 2013-02-11 DIAGNOSIS — J349 Unspecified disorder of nose and nasal sinuses: Secondary | ICD-10-CM

## 2013-02-11 DIAGNOSIS — G5621 Lesion of ulnar nerve, right upper limb: Secondary | ICD-10-CM

## 2013-02-11 LAB — URINALYSIS, ROUTINE W REFLEX MICROSCOPIC
Glucose, UA: NEGATIVE mg/dL
Leukocytes, UA: NEGATIVE
pH: 6 (ref 5.0–8.0)

## 2013-02-11 LAB — CBC WITH DIFFERENTIAL/PLATELET
Basophils Relative: 0 % (ref 0–1)
Eosinophils Absolute: 0.3 10*3/uL (ref 0.0–0.7)
Hemoglobin: 13.8 g/dL (ref 12.0–15.0)
MCH: 29.9 pg (ref 26.0–34.0)
MCHC: 33.7 g/dL (ref 30.0–36.0)
Monocytes Relative: 8 % (ref 3–12)
Neutrophils Relative %: 53 % (ref 43–77)
Platelets: 257 10*3/uL (ref 150–400)
RDW: 13.7 % (ref 11.5–15.5)

## 2013-02-11 LAB — TROPONIN I: Troponin I: <0.3 ng/mL (ref <0.30)

## 2013-02-11 LAB — COMPREHENSIVE METABOLIC PANEL
Albumin: 3.7 g/dL (ref 3.5–5.2)
Alkaline Phosphatase: 78 U/L (ref 39–117)
BUN: 11 mg/dL (ref 6–23)
Creatinine, Ser: 0.7 mg/dL (ref 0.50–1.10)
Potassium: 4.3 mEq/L (ref 3.5–5.1)
Total Protein: 7.4 g/dL (ref 6.0–8.3)

## 2013-02-11 MED ORDER — SODIUM CHLORIDE 0.9 % IV BOLUS (SEPSIS)
1000.0000 mL | Freq: Once | INTRAVENOUS | Status: AC
  Administered 2013-02-11: 1000 mL via INTRAVENOUS

## 2013-02-11 MED ORDER — IBUPROFEN 600 MG PO TABS: 600.0000 mg | ORAL_TABLET | Freq: Four times a day (QID) | ORAL | Status: DC | PRN

## 2013-02-11 MED ORDER — OXYMETAZOLINE HCL 0.05 % NA SOLN: 2.0000 | Freq: Two times a day (BID) | NASAL | Status: DC

## 2013-02-11 MED ORDER — MOMETASONE FUROATE 50 MCG/ACT NA SUSP: 2.0000 | Freq: Every day | NASAL | Status: DC

## 2013-02-11 NOTE — ED Notes (Signed)
Pt c/o right arm tingling for a few seconds but has resolved, denies pain. Pt reports h/o same. Pt also sts she has felt "lightheaded today".

## 2013-02-11 NOTE — ED Provider Notes (Signed)
History    CSN: [id number] Arrival date & time 02/11/13  1716  First MD Initiated Contact with Patient 02/11/13 1737     Chief Complaint  Patient presents with  . Arm Pain  . Dizziness   (Consider location/radiation/quality/duration/timing/severity/associated sxs/prior Treatment) HPI PT c/o of intermittent lightheadedness today worse when changing positions. No focal weakness. No recent medication changes. Pt also has been having R hand tingling esp in 4 th and 5 th digits. Pins and needles sensation goes up forearm. No head or neck trauma. No fever or chills. No N/V/D. No CP, SOB, cough.  Past Medical History  Diagnosis Date  . Shortness of breath     maybe with exertion  . Headache(784.0)     occasional migraine  . Arthritis     osteoarthritis  . History of seasonal allergies    Past Surgical History  Procedure Laterality Date  . Tonsillectomy    . Fracture surgery      left 5th finger fx with bone graft repair  . Abdominal hysterectomy    . Tubal ligation    . Total knee arthroplasty  08/03/2011    Procedure: TOTAL KNEE ARTHROPLASTY;  Surgeon: Harvie Junior;  Location: [facility] OR;  Service: Orthopedics;  Laterality: Right;  COMPUTER ASSISTED TOTAL KNEE REPLACEMENT with revision tibial component   Family History  Problem Relation Age of Onset  . Anesthesia problems Neg Hx   . Hypotension Neg Hx   . Malignant hyperthermia Neg Hx   . Pseudochol deficiency Neg Hx    History  Substance Use Topics  . Smoking status: Never Smoker   . Smokeless tobacco: Not on file  . Alcohol Use: No   OB History   Grav Para Term Preterm Abortions TAB SAB Ect Mult Living                 Review of Systems  Constitutional: Negative for fever, chills and fatigue.  HENT: Negative for neck pain and neck stiffness.   Eyes: Negative for visual disturbance.  Respiratory: Negative for cough, shortness of breath and wheezing.   Cardiovascular: Negative for chest pain, palpitations and leg  swelling.  Gastrointestinal: Negative for nausea, vomiting, abdominal pain and diarrhea.  Genitourinary: Negative for dysuria, frequency and flank pain.  Musculoskeletal: Negative for myalgias and arthralgias.  Skin: Negative for pallor, rash and wound.  Neurological: Positive for dizziness, light-headedness and numbness. Negative for syncope, weakness and headaches.  All other systems reviewed and are negative.    Allergies  Review of patient's allergies indicates no known allergies.  Home Medications   Current Outpatient Rx  Name  Route  Sig  Dispense  Refill  . albuterol (PROVENTIL HFA;VENTOLIN HFA) 108 (90 BASE) MCG/ACT inhaler   Inhalation   Inhale 2 puffs into the lungs every 6 (six) hours as needed for wheezing.         Marland Kitchen EPINEPHrine (EPIPEN JR) 0.15 MG/0.3ML injection   Intramuscular   Inject 0.15 mg into the muscle as needed. For allergic reaction          . Fluticasone-Salmeterol (ADVAIR) 250-50 MCG/DOSE AEPB   Inhalation   Inhale 1 puff into the lungs every 12 (twelve) hours.           Marland Kitchen ibuprofen (ADVIL,MOTRIN) 600 MG tablet   Oral   Take 1 tablet (600 mg total) by mouth every 6 (six) hours as needed for pain.   30 tablet   0   . levocetirizine (XYZAL) 5 MG  tablet   Oral   Take 5 mg by mouth every evening.           . mometasone (NASONEX) 50 MCG/ACT nasal spray   Nasal   Place 2 sprays into the nose daily.           . mometasone (NASONEX) 50 MCG/ACT nasal spray   Nasal   Place 2 sprays into the nose daily.   17 g   12   . montelukast (SINGULAIR) 10 MG tablet   Oral   Take 10 mg by mouth at bedtime.           Marland Kitchen oxymetazoline (AFRIN NASAL SPRAY) 0.05 % nasal spray   Nasal   Place 2 sprays into the nose 2 (two) times daily.   30 mL   0   . PRESCRIPTION MEDICATION   Subcutaneous   Inject 1 kit into the skin every 7 (seven) days. Allergy shot unknown to pt          . SUMAtriptan (IMITREX) 100 MG tablet   Oral   Take 100 mg by mouth  every 2 (two) hours as needed. For migraines           . traMADol (ULTRAM) 50 MG tablet   Oral   Take 50 mg by mouth every 6 (six) hours as needed. For pain; Maximum dose= 8 tablets per day           BP 183/96  Pulse 79  Temp(Src) 98.2 F (36.8 C) (Oral)  Resp 20  Ht 5\' 2"  (1.575 m)  Wt 250 lb (113.399 kg)  BMI 45.71 kg/m2  SpO2 100% Physical Exam  Nursing note and vitals reviewed. Constitutional: She is oriented to person, place, and time. She appears well-developed and well-nourished. No distress.  HENT:  Head: Normocephalic and atraumatic.  Mouth/Throat: Oropharynx is clear and moist. No oropharyngeal exudate.  Eyes: EOM are normal. Pupils are equal, round, and reactive to light.  Neck: Normal range of motion. Neck supple.  Mild TTP R trapezius muscle. No midline TTP  Cardiovascular: Normal rate and regular rhythm.   Pulmonary/Chest: Effort normal and breath sounds normal. No respiratory distress. She has no wheezes. She has no rales. She exhibits no tenderness.  Abdominal: Soft. Bowel sounds are normal. She exhibits no distension and no mass. There is no tenderness. There is no rebound and no guarding.  Musculoskeletal: Normal range of motion. She exhibits no edema and no tenderness.  Neurological: She is alert and oriented to person, place, and time.  Decreased sensation to palmar surface of R 4th and 5th digits. Intrinsic muscle of hand intact. 5/5 grip strength bl. 5/5 motor in bl LE.   Skin: Skin is warm and dry. No rash noted. No erythema.  Psychiatric: She has a normal mood and affect. Her behavior is normal.    ED Course  Procedures (including critical care time) Labs Reviewed  CBC WITH DIFFERENTIAL  COMPREHENSIVE METABOLIC PANEL  URINALYSIS, ROUTINE W REFLEX MICROSCOPIC  TROPONIN I   Ct Head Wo Contrast  02/11/2013   *RADIOLOGY REPORT*  Clinical Data: Dizziness and headaches.  CT HEAD WITHOUT CONTRAST  Technique:  Contiguous axial images were obtained from  the base of the skull through the vertex without contrast.  Comparison: 10/10/2010.  Findings: No intracranial hemorrhage.  No CT evidence of large acute infarct.  No hydrocephalus.  No intracranial mass lesion detected on this unenhanced exam.  Mastoid air cells, middle ear cavities and visualized paranasal sinuses are  clear with exception of mild mucosal thickening left maxillary sinus.  Minimal exophthalmos.  IMPRESSION: Minimal mucosal thickening left maxillary sinus otherwise negative unenhanced head CT.  Please see above.   Original Report Authenticated By: Lacy Duverney, M.D.   1. Lightheadedness   2. Ulnar neuropathy, right   3. Sinus disease      Date: 02/11/2013  Rate: 80  Rhythm: normal sinus rhythm  QRS Axis: normal  Intervals: QT prolonged  ST/T Wave abnormalities: nonspecific T wave changes  Conduction Disutrbances:right bundle branch block  Narrative Interpretation:   Old EKG Reviewed: no significant changes.    MDM    Loren Racer, MD 02/12/13 2351

## 2015-12-28 DIAGNOSIS — J45909 Unspecified asthma, uncomplicated: Secondary | ICD-10-CM | POA: Insufficient documentation

## 2015-12-28 DIAGNOSIS — I119 Hypertensive heart disease without heart failure: Secondary | ICD-10-CM | POA: Insufficient documentation

## 2015-12-28 DIAGNOSIS — I10 Essential (primary) hypertension: Secondary | ICD-10-CM | POA: Insufficient documentation

## 2015-12-28 DIAGNOSIS — M791 Myalgia, unspecified site: Secondary | ICD-10-CM

## 2015-12-28 DIAGNOSIS — G43909 Migraine, unspecified, not intractable, without status migrainosus: Secondary | ICD-10-CM

## 2015-12-28 DIAGNOSIS — Z79899 Other long term (current) drug therapy: Secondary | ICD-10-CM | POA: Insufficient documentation

## 2015-12-28 DIAGNOSIS — E559 Vitamin D deficiency, unspecified: Secondary | ICD-10-CM | POA: Insufficient documentation

## 2015-12-28 DIAGNOSIS — E782 Mixed hyperlipidemia: Secondary | ICD-10-CM | POA: Insufficient documentation

## 2015-12-28 HISTORY — DX: Essential (primary) hypertension: I10

## 2015-12-28 HISTORY — DX: Unspecified asthma, uncomplicated: J45.909

## 2015-12-28 HISTORY — DX: Migraine, unspecified, not intractable, without status migrainosus: G43.909

## 2015-12-28 HISTORY — DX: Myalgia, unspecified site: M79.10

## 2015-12-28 HISTORY — DX: Mixed hyperlipidemia: E78.2

## 2015-12-28 HISTORY — DX: Other long term (current) drug therapy: Z79.899

## 2016-08-30 DIAGNOSIS — M79671 Pain in right foot: Secondary | ICD-10-CM | POA: Insufficient documentation

## 2016-08-30 DIAGNOSIS — L989 Disorder of the skin and subcutaneous tissue, unspecified: Secondary | ICD-10-CM | POA: Insufficient documentation

## 2016-08-30 HISTORY — DX: Pain in right foot: M79.671

## 2016-08-30 HISTORY — DX: Disorder of the skin and subcutaneous tissue, unspecified: L98.9

## 2017-01-08 DIAGNOSIS — Z0000 Encounter for general adult medical examination without abnormal findings: Secondary | ICD-10-CM

## 2017-01-08 DIAGNOSIS — Z1211 Encounter for screening for malignant neoplasm of colon: Secondary | ICD-10-CM | POA: Insufficient documentation

## 2017-01-08 HISTORY — DX: Encounter for screening for malignant neoplasm of colon: Z12.11

## 2017-01-08 HISTORY — DX: Encounter for general adult medical examination without abnormal findings: Z00.00

## 2017-02-14 DIAGNOSIS — H5711 Ocular pain, right eye: Secondary | ICD-10-CM

## 2017-02-14 DIAGNOSIS — H00022 Hordeolum internum right lower eyelid: Secondary | ICD-10-CM | POA: Insufficient documentation

## 2017-02-14 HISTORY — DX: Hordeolum internum right lower eyelid: H00.022

## 2017-02-14 HISTORY — DX: Ocular pain, right eye: H57.11

## 2017-07-18 DIAGNOSIS — R42 Dizziness and giddiness: Secondary | ICD-10-CM | POA: Insufficient documentation

## 2017-09-22 DIAGNOSIS — J0110 Acute frontal sinusitis, unspecified: Secondary | ICD-10-CM

## 2017-09-22 HISTORY — DX: Acute frontal sinusitis, unspecified: J01.10

## 2017-10-11 DIAGNOSIS — R053 Chronic cough: Secondary | ICD-10-CM | POA: Insufficient documentation

## 2017-10-11 HISTORY — DX: Chronic cough: R05.3

## 2017-11-22 DIAGNOSIS — J3089 Other allergic rhinitis: Secondary | ICD-10-CM | POA: Diagnosis not present

## 2017-11-22 DIAGNOSIS — J301 Allergic rhinitis due to pollen: Secondary | ICD-10-CM | POA: Diagnosis not present

## 2017-11-28 DIAGNOSIS — J301 Allergic rhinitis due to pollen: Secondary | ICD-10-CM | POA: Diagnosis not present

## 2017-11-28 DIAGNOSIS — J3089 Other allergic rhinitis: Secondary | ICD-10-CM | POA: Diagnosis not present

## 2017-12-03 DIAGNOSIS — G4733 Obstructive sleep apnea (adult) (pediatric): Secondary | ICD-10-CM | POA: Diagnosis not present

## 2017-12-12 DIAGNOSIS — J3089 Other allergic rhinitis: Secondary | ICD-10-CM | POA: Diagnosis not present

## 2017-12-12 DIAGNOSIS — J301 Allergic rhinitis due to pollen: Secondary | ICD-10-CM | POA: Diagnosis not present

## 2017-12-20 DIAGNOSIS — J3089 Other allergic rhinitis: Secondary | ICD-10-CM | POA: Diagnosis not present

## 2017-12-25 DIAGNOSIS — J301 Allergic rhinitis due to pollen: Secondary | ICD-10-CM | POA: Diagnosis not present

## 2017-12-25 DIAGNOSIS — J4530 Mild persistent asthma, uncomplicated: Secondary | ICD-10-CM | POA: Diagnosis not present

## 2017-12-25 DIAGNOSIS — J3089 Other allergic rhinitis: Secondary | ICD-10-CM | POA: Diagnosis not present

## 2017-12-25 DIAGNOSIS — H1045 Other chronic allergic conjunctivitis: Secondary | ICD-10-CM | POA: Diagnosis not present

## 2017-12-26 DIAGNOSIS — M67911 Unspecified disorder of synovium and tendon, right shoulder: Secondary | ICD-10-CM | POA: Diagnosis not present

## 2017-12-31 DIAGNOSIS — J301 Allergic rhinitis due to pollen: Secondary | ICD-10-CM | POA: Diagnosis not present

## 2017-12-31 DIAGNOSIS — J3089 Other allergic rhinitis: Secondary | ICD-10-CM | POA: Diagnosis not present

## 2018-01-02 DIAGNOSIS — J3089 Other allergic rhinitis: Secondary | ICD-10-CM | POA: Diagnosis not present

## 2018-01-02 DIAGNOSIS — J301 Allergic rhinitis due to pollen: Secondary | ICD-10-CM | POA: Diagnosis not present

## 2018-01-17 DIAGNOSIS — J3089 Other allergic rhinitis: Secondary | ICD-10-CM | POA: Diagnosis not present

## 2018-01-17 DIAGNOSIS — Z0000 Encounter for general adult medical examination without abnormal findings: Secondary | ICD-10-CM | POA: Diagnosis not present

## 2018-01-17 DIAGNOSIS — J301 Allergic rhinitis due to pollen: Secondary | ICD-10-CM | POA: Diagnosis not present

## 2018-01-19 DIAGNOSIS — Z23 Encounter for immunization: Secondary | ICD-10-CM | POA: Diagnosis not present

## 2018-01-23 DIAGNOSIS — J301 Allergic rhinitis due to pollen: Secondary | ICD-10-CM | POA: Diagnosis not present

## 2018-01-23 DIAGNOSIS — J3089 Other allergic rhinitis: Secondary | ICD-10-CM | POA: Diagnosis not present

## 2018-01-28 DIAGNOSIS — G4733 Obstructive sleep apnea (adult) (pediatric): Secondary | ICD-10-CM | POA: Diagnosis not present

## 2018-01-30 DIAGNOSIS — J3089 Other allergic rhinitis: Secondary | ICD-10-CM | POA: Diagnosis not present

## 2018-01-30 DIAGNOSIS — J301 Allergic rhinitis due to pollen: Secondary | ICD-10-CM | POA: Diagnosis not present

## 2018-02-06 DIAGNOSIS — J3089 Other allergic rhinitis: Secondary | ICD-10-CM | POA: Diagnosis not present

## 2018-02-06 DIAGNOSIS — J301 Allergic rhinitis due to pollen: Secondary | ICD-10-CM | POA: Diagnosis not present

## 2018-02-13 DIAGNOSIS — J3089 Other allergic rhinitis: Secondary | ICD-10-CM | POA: Diagnosis not present

## 2018-02-13 DIAGNOSIS — J301 Allergic rhinitis due to pollen: Secondary | ICD-10-CM | POA: Diagnosis not present

## 2018-02-18 DIAGNOSIS — J3089 Other allergic rhinitis: Secondary | ICD-10-CM | POA: Diagnosis not present

## 2018-02-18 DIAGNOSIS — J301 Allergic rhinitis due to pollen: Secondary | ICD-10-CM | POA: Diagnosis not present

## 2018-02-27 DIAGNOSIS — J301 Allergic rhinitis due to pollen: Secondary | ICD-10-CM | POA: Diagnosis not present

## 2018-02-27 DIAGNOSIS — J3089 Other allergic rhinitis: Secondary | ICD-10-CM | POA: Diagnosis not present

## 2018-03-04 DIAGNOSIS — G4733 Obstructive sleep apnea (adult) (pediatric): Secondary | ICD-10-CM | POA: Diagnosis not present

## 2018-03-06 DIAGNOSIS — J301 Allergic rhinitis due to pollen: Secondary | ICD-10-CM | POA: Diagnosis not present

## 2018-03-06 DIAGNOSIS — J3089 Other allergic rhinitis: Secondary | ICD-10-CM | POA: Diagnosis not present

## 2018-03-13 DIAGNOSIS — J3089 Other allergic rhinitis: Secondary | ICD-10-CM | POA: Diagnosis not present

## 2018-03-18 DIAGNOSIS — J3089 Other allergic rhinitis: Secondary | ICD-10-CM | POA: Diagnosis not present

## 2018-03-18 DIAGNOSIS — J301 Allergic rhinitis due to pollen: Secondary | ICD-10-CM | POA: Diagnosis not present

## 2018-03-27 DIAGNOSIS — J988 Other specified respiratory disorders: Secondary | ICD-10-CM | POA: Diagnosis not present

## 2018-04-01 DIAGNOSIS — M79601 Pain in right arm: Secondary | ICD-10-CM | POA: Diagnosis not present

## 2018-04-01 DIAGNOSIS — M79602 Pain in left arm: Secondary | ICD-10-CM | POA: Diagnosis not present

## 2018-04-01 DIAGNOSIS — M25532 Pain in left wrist: Secondary | ICD-10-CM | POA: Diagnosis not present

## 2018-04-01 DIAGNOSIS — M25531 Pain in right wrist: Secondary | ICD-10-CM | POA: Diagnosis not present

## 2018-04-04 DIAGNOSIS — J3089 Other allergic rhinitis: Secondary | ICD-10-CM | POA: Diagnosis not present

## 2018-04-08 DIAGNOSIS — J301 Allergic rhinitis due to pollen: Secondary | ICD-10-CM | POA: Diagnosis not present

## 2018-04-08 DIAGNOSIS — J3089 Other allergic rhinitis: Secondary | ICD-10-CM | POA: Diagnosis not present

## 2018-04-16 DIAGNOSIS — J3089 Other allergic rhinitis: Secondary | ICD-10-CM | POA: Diagnosis not present

## 2018-04-16 DIAGNOSIS — J301 Allergic rhinitis due to pollen: Secondary | ICD-10-CM | POA: Diagnosis not present

## 2018-04-18 ENCOUNTER — Encounter: Payer: Self-pay | Admitting: Pulmonary Disease

## 2018-04-18 ENCOUNTER — Telehealth: Payer: Self-pay | Admitting: Pulmonary Disease

## 2018-04-18 ENCOUNTER — Ambulatory Visit (INDEPENDENT_AMBULATORY_CARE_PROVIDER_SITE_OTHER): Payer: BLUE CROSS/BLUE SHIELD | Admitting: Pulmonary Disease

## 2018-04-18 VITALS — BP 130/78 | HR 74 | Ht 61.0 in | Wt 261.0 lb

## 2018-04-18 DIAGNOSIS — G4730 Sleep apnea, unspecified: Secondary | ICD-10-CM

## 2018-04-18 DIAGNOSIS — G4733 Obstructive sleep apnea (adult) (pediatric): Secondary | ICD-10-CM | POA: Diagnosis not present

## 2018-04-18 DIAGNOSIS — Z6841 Body Mass Index (BMI) 40.0 and over, adult: Secondary | ICD-10-CM

## 2018-04-18 DIAGNOSIS — Z23 Encounter for immunization: Secondary | ICD-10-CM

## 2018-04-18 DIAGNOSIS — E669 Obesity, unspecified: Secondary | ICD-10-CM

## 2018-04-18 NOTE — Telephone Encounter (Signed)
Medical [facility]  [facility]

## 2018-04-18 NOTE — Progress Notes (Signed)
[facility]  Chief Complaint  Patient presents with  . sleep consult    currently wears cpap avg 6-7hr- mask occ moves.    Constitutional: BP 130/78 (BP Location: Left Arm, Cuff Size: Normal)   Pulse 74   Ht _0  (1.549 m)   Wt 261 lb (118.4 kg)   SpO2 95%   BMI 49.32 kg/m   History of Present Illness: Rebecca Rojas is a 62 y.o. female with sleep apnea.  Her husband has sleep apnea and uses CPAP.  A few years ago he mentioned that she was snoring and stopped breathing like he did before starting CPAP.  She had sleep study in [place] and found to have sleep apnea.  She was started on CPAP 9 cm H2O.  She uses nasal pillows.  Gets supplies from [employer].  Her sleep is much better.  No having trouble with sinus congestion, dry mouth, sore throat, or aerophagia.  She was told by her doctor in [place] that she needed to get another sleep study for monitoring.  She questioned this and decided to get a second opinion.  She goes to sleep at 9 pm.  She falls asleep 5 minutes.  She wakes up 2 times to use the bathroom.  She gets out of bed at 3 am on weekdays, and 7 am on weekends.  She feels rested in the morning when she gets enough hours of sleep.  She denies morning headache.  She does not use anything to help her fall sleep or stay awake.  She used to grind her teeth, but this resolved after she started CPAP.  She denies sleep walking, sleep talking, or nightmares.  There is no history of restless legs.  She denies sleep hallucinations, sleep paralysis, or cataplexy.  The Epworth score is 9 out of 24.    Comprehensive Respiratory Exam:  Appearance - well kempt  ENMT - nasal mucosa moist, turbinates clear, midline nasal septum, no dental lesions, no gingival bleeding, no oral exudates, no tonsillar hypertrophy Neck - no masses, trachea midline, no thyromegaly, no elevation in JVP Respiratory - normal appearance of chest wall, normal respiratory  effort w/o accessory muscle use, no dullness on percussion, no wheezing or rales CV - s1s2 regular rate and rhythm, no murmurs, ankle edema, radial pulses symmetric GI - soft, non tender, no masses Lymph - no adenopathy noted in neck and axillary areas MSK - normal muscle strength and tone, normal gait Ext - no cyanosis, clubbing, or joint inflammation noted Skin - no rashes, lesions, or ulcers Neuro - oriented to person, place, and time Psych - normal mood and affect  Assessment/Plan:  Obstructive sleep apnea. - she is compliant with CPAP and reports benefit from therapy - continue CPAP 9 cm H2O - discussed options to assist with mask fit - reviewed alternative therapies for treating sleep apnea - explained my typical indications for needing repeat sleep testing, and I don't think she requires repeat sleep testing at this time  Obesity. - dicussed how her weight could be contributing to her sleep issues - reviewed options to assist with weight loss  Allergic asthma. - she is followed by Dr. Mosetta Anis   Patient Instructions  Flu shot today  Will have you sign a release form to get copy of diagnostic sleep study from Dr. Maxie Barb office in [place]  Follow up in 1 year    Chesley Mires, MD [place] 04/18/2018, 10:58 AM  Flow  Sheet  Sleep tests: CPAP 03/19/18 to 04/17/18 >> used on 28 of 30 nights with average 6 hrs 30 min.  Average AHI 0.3 with CPAP 9 cm H2O.  Review of Systems: Negative except in HPI  Past Medical History: She  has a past medical history of Arthritis, Headache(784.0), History of seasonal allergies, Shortness of breath, and Sleep apnea.  Past Surgical History: She  has a past surgical history that includes Tonsillectomy; Fracture surgery; Abdominal hysterectomy; Tubal ligation; and Total knee arthroplasty (08/03/2011).  Family History: Her family history includes Heart attack in her mother; Heart disease in her mother;  Hypertension in her mother.  Social History: She  reports that she has never smoked. She has never used smokeless tobacco. She reports that she does not drink alcohol or use drugs.  Medications: Allergies as of 04/18/2018      Reactions   Cefdinir Itching   Hydrocodone-homatropine Rash      Medication List        Accurate as of 04/18/18 10:58 AM. Always use your most recent med list.          albuterol 108 (90 Base) MCG/ACT inhaler Commonly known as:  PROVENTIL HFA;VENTOLIN HFA Inhale 2 puffs into the lungs every 6 (six) hours as needed for wheezing.   BREO ELLIPTA 200-25 MCG/INH Aepb Generic drug:  fluticasone furoate-vilanterol INHALE 1 PUFF BY MOUTH EVERY DAY   EPINEPHrine 0.15 MG/0.3ML injection Commonly known as:  EPIPEN JR Inject 0.15 mg into the muscle as needed. For allergic reaction   ibuprofen 600 MG tablet Commonly known as:  ADVIL,MOTRIN Take 1 tablet (600 mg total) by mouth every 6 (six) hours as needed for pain.   levocetirizine 5 MG tablet Commonly known as:  XYZAL Take 5 mg by mouth every evening.   mometasone 50 MCG/ACT nasal spray Commonly known as:  NASONEX Place 2 sprays into the nose daily.   montelukast 10 MG tablet Commonly known as:  SINGULAIR Take 10 mg by mouth at bedtime.   PRESCRIPTION MEDICATION Inject 1 kit into the skin every 7 (seven) days. Allergy shot unknown to pt   SUMAtriptan 100 MG tablet Commonly known as:  IMITREX Take 100 mg by mouth every 2 (two) hours as needed. For migraines

## 2018-04-18 NOTE — Patient Instructions (Signed)
Flu shot today  Will have you sign a release form to get copy of diagnostic sleep study from Dr. [place]ri[place]  Follow up in 1 year

## 2018-04-24 DIAGNOSIS — J301 Allergic rhinitis due to pollen: Secondary | ICD-10-CM | POA: Diagnosis not present

## 2018-04-24 DIAGNOSIS — M25531 Pain in right wrist: Secondary | ICD-10-CM | POA: Diagnosis not present

## 2018-04-24 DIAGNOSIS — J3089 Other allergic rhinitis: Secondary | ICD-10-CM | POA: Diagnosis not present

## 2018-04-24 DIAGNOSIS — G5602 Carpal tunnel syndrome, left upper limb: Secondary | ICD-10-CM | POA: Diagnosis not present

## 2018-04-24 DIAGNOSIS — G5601 Carpal tunnel syndrome, right upper limb: Secondary | ICD-10-CM | POA: Diagnosis not present

## 2018-04-24 DIAGNOSIS — M25532 Pain in left wrist: Secondary | ICD-10-CM | POA: Diagnosis not present

## 2018-04-28 DIAGNOSIS — M25532 Pain in left wrist: Secondary | ICD-10-CM | POA: Diagnosis not present

## 2018-04-28 DIAGNOSIS — M25531 Pain in right wrist: Secondary | ICD-10-CM | POA: Diagnosis not present

## 2018-04-29 DIAGNOSIS — G4733 Obstructive sleep apnea (adult) (pediatric): Secondary | ICD-10-CM | POA: Diagnosis not present

## 2018-05-01 DIAGNOSIS — J3089 Other allergic rhinitis: Secondary | ICD-10-CM | POA: Diagnosis not present

## 2018-05-01 DIAGNOSIS — J301 Allergic rhinitis due to pollen: Secondary | ICD-10-CM | POA: Diagnosis not present

## 2018-05-07 DIAGNOSIS — J3089 Other allergic rhinitis: Secondary | ICD-10-CM | POA: Diagnosis not present

## 2018-05-07 DIAGNOSIS — J301 Allergic rhinitis due to pollen: Secondary | ICD-10-CM | POA: Diagnosis not present

## 2018-05-08 DIAGNOSIS — J029 Acute pharyngitis, unspecified: Secondary | ICD-10-CM | POA: Diagnosis not present

## 2018-05-11 DIAGNOSIS — J029 Acute pharyngitis, unspecified: Secondary | ICD-10-CM | POA: Insufficient documentation

## 2018-05-11 HISTORY — DX: Acute pharyngitis, unspecified: J02.9

## 2018-05-16 DIAGNOSIS — J3089 Other allergic rhinitis: Secondary | ICD-10-CM | POA: Diagnosis not present

## 2018-05-16 DIAGNOSIS — J301 Allergic rhinitis due to pollen: Secondary | ICD-10-CM | POA: Diagnosis not present

## 2018-05-21 DIAGNOSIS — J301 Allergic rhinitis due to pollen: Secondary | ICD-10-CM | POA: Diagnosis not present

## 2018-05-21 DIAGNOSIS — J3089 Other allergic rhinitis: Secondary | ICD-10-CM | POA: Diagnosis not present

## 2018-05-23 DIAGNOSIS — J029 Acute pharyngitis, unspecified: Secondary | ICD-10-CM | POA: Insufficient documentation

## 2018-05-23 HISTORY — DX: Acute pharyngitis, unspecified: J02.9

## 2018-05-28 DIAGNOSIS — M79641 Pain in right hand: Secondary | ICD-10-CM | POA: Diagnosis not present

## 2018-05-28 DIAGNOSIS — M25531 Pain in right wrist: Secondary | ICD-10-CM | POA: Diagnosis not present

## 2018-05-28 DIAGNOSIS — M79642 Pain in left hand: Secondary | ICD-10-CM | POA: Diagnosis not present

## 2018-05-28 DIAGNOSIS — J3089 Other allergic rhinitis: Secondary | ICD-10-CM | POA: Diagnosis not present

## 2018-05-28 DIAGNOSIS — J301 Allergic rhinitis due to pollen: Secondary | ICD-10-CM | POA: Diagnosis not present

## 2018-06-03 DIAGNOSIS — G4733 Obstructive sleep apnea (adult) (pediatric): Secondary | ICD-10-CM | POA: Diagnosis not present

## 2018-06-03 DIAGNOSIS — M65321 Trigger finger, right index finger: Secondary | ICD-10-CM | POA: Diagnosis not present

## 2018-06-03 DIAGNOSIS — J301 Allergic rhinitis due to pollen: Secondary | ICD-10-CM | POA: Diagnosis not present

## 2018-06-03 DIAGNOSIS — J3089 Other allergic rhinitis: Secondary | ICD-10-CM | POA: Diagnosis not present

## 2018-06-13 DIAGNOSIS — J301 Allergic rhinitis due to pollen: Secondary | ICD-10-CM | POA: Diagnosis not present

## 2018-06-13 DIAGNOSIS — J3089 Other allergic rhinitis: Secondary | ICD-10-CM | POA: Diagnosis not present

## 2018-06-27 DIAGNOSIS — J3081 Allergic rhinitis due to animal (cat) (dog) hair and dander: Secondary | ICD-10-CM | POA: Diagnosis not present

## 2018-06-27 DIAGNOSIS — J301 Allergic rhinitis due to pollen: Secondary | ICD-10-CM | POA: Diagnosis not present

## 2018-06-27 DIAGNOSIS — J3089 Other allergic rhinitis: Secondary | ICD-10-CM | POA: Diagnosis not present

## 2018-07-01 DIAGNOSIS — J301 Allergic rhinitis due to pollen: Secondary | ICD-10-CM | POA: Diagnosis not present

## 2018-07-01 DIAGNOSIS — J3089 Other allergic rhinitis: Secondary | ICD-10-CM | POA: Diagnosis not present

## 2018-07-05 DIAGNOSIS — S81812A Laceration without foreign body, left lower leg, initial encounter: Secondary | ICD-10-CM | POA: Diagnosis not present

## 2018-07-10 DIAGNOSIS — J3089 Other allergic rhinitis: Secondary | ICD-10-CM | POA: Diagnosis not present

## 2018-07-10 DIAGNOSIS — J301 Allergic rhinitis due to pollen: Secondary | ICD-10-CM | POA: Diagnosis not present

## 2018-07-15 DIAGNOSIS — J3089 Other allergic rhinitis: Secondary | ICD-10-CM | POA: Diagnosis not present

## 2018-07-15 DIAGNOSIS — J301 Allergic rhinitis due to pollen: Secondary | ICD-10-CM | POA: Diagnosis not present

## 2018-07-19 DIAGNOSIS — G4733 Obstructive sleep apnea (adult) (pediatric): Secondary | ICD-10-CM | POA: Diagnosis not present

## 2018-07-23 DIAGNOSIS — J3089 Other allergic rhinitis: Secondary | ICD-10-CM | POA: Diagnosis not present

## 2018-07-23 DIAGNOSIS — S81802A Unspecified open wound, left lower leg, initial encounter: Secondary | ICD-10-CM | POA: Diagnosis not present

## 2018-07-23 DIAGNOSIS — R600 Localized edema: Secondary | ICD-10-CM | POA: Diagnosis not present

## 2018-07-23 DIAGNOSIS — J301 Allergic rhinitis due to pollen: Secondary | ICD-10-CM | POA: Diagnosis not present

## 2018-07-25 DIAGNOSIS — S81802A Unspecified open wound, left lower leg, initial encounter: Secondary | ICD-10-CM | POA: Diagnosis not present

## 2018-07-25 DIAGNOSIS — R600 Localized edema: Secondary | ICD-10-CM | POA: Diagnosis not present

## 2018-07-28 DIAGNOSIS — G4733 Obstructive sleep apnea (adult) (pediatric): Secondary | ICD-10-CM | POA: Diagnosis not present

## 2018-08-01 DIAGNOSIS — J3089 Other allergic rhinitis: Secondary | ICD-10-CM | POA: Diagnosis not present

## 2018-08-01 DIAGNOSIS — J301 Allergic rhinitis due to pollen: Secondary | ICD-10-CM | POA: Diagnosis not present

## 2018-08-04 DIAGNOSIS — R600 Localized edema: Secondary | ICD-10-CM | POA: Insufficient documentation

## 2018-08-04 DIAGNOSIS — S81802A Unspecified open wound, left lower leg, initial encounter: Secondary | ICD-10-CM | POA: Insufficient documentation

## 2018-08-04 HISTORY — DX: Unspecified open wound, left lower leg, initial encounter: S81.802A

## 2018-08-04 HISTORY — DX: Localized edema: R60.0

## 2018-08-05 DIAGNOSIS — J3089 Other allergic rhinitis: Secondary | ICD-10-CM | POA: Diagnosis not present

## 2018-08-05 DIAGNOSIS — J301 Allergic rhinitis due to pollen: Secondary | ICD-10-CM | POA: Diagnosis not present

## 2018-08-06 DIAGNOSIS — S81802A Unspecified open wound, left lower leg, initial encounter: Secondary | ICD-10-CM | POA: Diagnosis not present

## 2018-08-06 DIAGNOSIS — R600 Localized edema: Secondary | ICD-10-CM | POA: Diagnosis not present

## 2018-08-22 DIAGNOSIS — J22 Unspecified acute lower respiratory infection: Secondary | ICD-10-CM | POA: Diagnosis not present

## 2018-09-01 DIAGNOSIS — J3089 Other allergic rhinitis: Secondary | ICD-10-CM | POA: Diagnosis not present

## 2018-09-01 DIAGNOSIS — J301 Allergic rhinitis due to pollen: Secondary | ICD-10-CM | POA: Diagnosis not present

## 2018-09-01 DIAGNOSIS — G4733 Obstructive sleep apnea (adult) (pediatric): Secondary | ICD-10-CM | POA: Diagnosis not present

## 2018-09-05 DIAGNOSIS — J301 Allergic rhinitis due to pollen: Secondary | ICD-10-CM | POA: Diagnosis not present

## 2018-09-05 DIAGNOSIS — J3089 Other allergic rhinitis: Secondary | ICD-10-CM | POA: Diagnosis not present

## 2018-09-09 DIAGNOSIS — R600 Localized edema: Secondary | ICD-10-CM | POA: Diagnosis not present

## 2018-09-09 DIAGNOSIS — S81802A Unspecified open wound, left lower leg, initial encounter: Secondary | ICD-10-CM | POA: Diagnosis not present

## 2018-09-10 DIAGNOSIS — J3089 Other allergic rhinitis: Secondary | ICD-10-CM | POA: Diagnosis not present

## 2018-09-10 DIAGNOSIS — J301 Allergic rhinitis due to pollen: Secondary | ICD-10-CM | POA: Diagnosis not present

## 2018-09-12 DIAGNOSIS — J301 Allergic rhinitis due to pollen: Secondary | ICD-10-CM | POA: Diagnosis not present

## 2018-09-12 DIAGNOSIS — J3089 Other allergic rhinitis: Secondary | ICD-10-CM | POA: Diagnosis not present

## 2018-09-17 DIAGNOSIS — J301 Allergic rhinitis due to pollen: Secondary | ICD-10-CM | POA: Diagnosis not present

## 2018-09-17 DIAGNOSIS — J3089 Other allergic rhinitis: Secondary | ICD-10-CM | POA: Diagnosis not present

## 2018-09-26 DIAGNOSIS — J3089 Other allergic rhinitis: Secondary | ICD-10-CM | POA: Diagnosis not present

## 2018-09-26 DIAGNOSIS — J301 Allergic rhinitis due to pollen: Secondary | ICD-10-CM | POA: Diagnosis not present

## 2018-10-01 DIAGNOSIS — J3089 Other allergic rhinitis: Secondary | ICD-10-CM | POA: Diagnosis not present

## 2018-10-01 DIAGNOSIS — J301 Allergic rhinitis due to pollen: Secondary | ICD-10-CM | POA: Diagnosis not present

## 2018-10-08 DIAGNOSIS — J301 Allergic rhinitis due to pollen: Secondary | ICD-10-CM | POA: Diagnosis not present

## 2018-10-08 DIAGNOSIS — J3089 Other allergic rhinitis: Secondary | ICD-10-CM | POA: Diagnosis not present

## 2018-10-16 DIAGNOSIS — J301 Allergic rhinitis due to pollen: Secondary | ICD-10-CM | POA: Diagnosis not present

## 2018-10-16 DIAGNOSIS — J3089 Other allergic rhinitis: Secondary | ICD-10-CM | POA: Diagnosis not present

## 2018-10-17 DIAGNOSIS — G4733 Obstructive sleep apnea (adult) (pediatric): Secondary | ICD-10-CM | POA: Diagnosis not present

## 2018-10-23 DIAGNOSIS — J3089 Other allergic rhinitis: Secondary | ICD-10-CM | POA: Diagnosis not present

## 2018-10-23 DIAGNOSIS — J301 Allergic rhinitis due to pollen: Secondary | ICD-10-CM | POA: Diagnosis not present

## 2018-10-27 DIAGNOSIS — G4733 Obstructive sleep apnea (adult) (pediatric): Secondary | ICD-10-CM | POA: Diagnosis not present

## 2018-10-28 DIAGNOSIS — J301 Allergic rhinitis due to pollen: Secondary | ICD-10-CM | POA: Diagnosis not present

## 2018-10-28 DIAGNOSIS — J3089 Other allergic rhinitis: Secondary | ICD-10-CM | POA: Diagnosis not present

## 2018-11-04 DIAGNOSIS — J3089 Other allergic rhinitis: Secondary | ICD-10-CM | POA: Diagnosis not present

## 2018-11-04 DIAGNOSIS — J301 Allergic rhinitis due to pollen: Secondary | ICD-10-CM | POA: Diagnosis not present

## 2018-11-11 DIAGNOSIS — J3089 Other allergic rhinitis: Secondary | ICD-10-CM | POA: Diagnosis not present

## 2018-11-11 DIAGNOSIS — J301 Allergic rhinitis due to pollen: Secondary | ICD-10-CM | POA: Diagnosis not present

## 2018-11-17 DIAGNOSIS — J3089 Other allergic rhinitis: Secondary | ICD-10-CM | POA: Diagnosis not present

## 2018-11-25 DIAGNOSIS — J301 Allergic rhinitis due to pollen: Secondary | ICD-10-CM | POA: Diagnosis not present

## 2018-11-25 DIAGNOSIS — J3089 Other allergic rhinitis: Secondary | ICD-10-CM | POA: Diagnosis not present

## 2018-12-01 DIAGNOSIS — J3089 Other allergic rhinitis: Secondary | ICD-10-CM | POA: Diagnosis not present

## 2018-12-09 DIAGNOSIS — J3081 Allergic rhinitis due to animal (cat) (dog) hair and dander: Secondary | ICD-10-CM | POA: Diagnosis not present

## 2018-12-09 DIAGNOSIS — J301 Allergic rhinitis due to pollen: Secondary | ICD-10-CM | POA: Diagnosis not present

## 2018-12-09 DIAGNOSIS — J3089 Other allergic rhinitis: Secondary | ICD-10-CM | POA: Diagnosis not present

## 2018-12-16 DIAGNOSIS — J3089 Other allergic rhinitis: Secondary | ICD-10-CM | POA: Diagnosis not present

## 2018-12-16 DIAGNOSIS — J301 Allergic rhinitis due to pollen: Secondary | ICD-10-CM | POA: Diagnosis not present

## 2018-12-26 DIAGNOSIS — H1045 Other chronic allergic conjunctivitis: Secondary | ICD-10-CM | POA: Diagnosis not present

## 2018-12-26 DIAGNOSIS — J301 Allergic rhinitis due to pollen: Secondary | ICD-10-CM | POA: Diagnosis not present

## 2018-12-26 DIAGNOSIS — J3081 Allergic rhinitis due to animal (cat) (dog) hair and dander: Secondary | ICD-10-CM | POA: Diagnosis not present

## 2018-12-26 DIAGNOSIS — J3089 Other allergic rhinitis: Secondary | ICD-10-CM | POA: Diagnosis not present

## 2018-12-26 DIAGNOSIS — J4530 Mild persistent asthma, uncomplicated: Secondary | ICD-10-CM | POA: Diagnosis not present

## 2018-12-29 DIAGNOSIS — J301 Allergic rhinitis due to pollen: Secondary | ICD-10-CM | POA: Diagnosis not present

## 2018-12-29 DIAGNOSIS — J3089 Other allergic rhinitis: Secondary | ICD-10-CM | POA: Diagnosis not present

## 2019-01-02 DIAGNOSIS — J3089 Other allergic rhinitis: Secondary | ICD-10-CM | POA: Diagnosis not present

## 2019-01-07 DIAGNOSIS — J3089 Other allergic rhinitis: Secondary | ICD-10-CM | POA: Diagnosis not present

## 2019-01-07 DIAGNOSIS — J301 Allergic rhinitis due to pollen: Secondary | ICD-10-CM | POA: Diagnosis not present

## 2019-01-14 DIAGNOSIS — J301 Allergic rhinitis due to pollen: Secondary | ICD-10-CM | POA: Diagnosis not present

## 2019-01-14 DIAGNOSIS — J3089 Other allergic rhinitis: Secondary | ICD-10-CM | POA: Diagnosis not present

## 2019-01-14 DIAGNOSIS — J3081 Allergic rhinitis due to animal (cat) (dog) hair and dander: Secondary | ICD-10-CM | POA: Diagnosis not present

## 2019-01-16 DIAGNOSIS — G4733 Obstructive sleep apnea (adult) (pediatric): Secondary | ICD-10-CM | POA: Diagnosis not present

## 2019-01-20 DIAGNOSIS — M76821 Posterior tibial tendinitis, right leg: Secondary | ICD-10-CM | POA: Diagnosis not present

## 2019-01-20 DIAGNOSIS — J301 Allergic rhinitis due to pollen: Secondary | ICD-10-CM | POA: Diagnosis not present

## 2019-01-20 DIAGNOSIS — M25511 Pain in right shoulder: Secondary | ICD-10-CM | POA: Diagnosis not present

## 2019-01-20 DIAGNOSIS — J3089 Other allergic rhinitis: Secondary | ICD-10-CM | POA: Diagnosis not present

## 2019-01-20 DIAGNOSIS — M7662 Achilles tendinitis, left leg: Secondary | ICD-10-CM | POA: Diagnosis not present

## 2019-01-20 DIAGNOSIS — M67911 Unspecified disorder of synovium and tendon, right shoulder: Secondary | ICD-10-CM | POA: Diagnosis not present

## 2019-01-22 DIAGNOSIS — J3089 Other allergic rhinitis: Secondary | ICD-10-CM | POA: Diagnosis not present

## 2019-01-22 DIAGNOSIS — J301 Allergic rhinitis due to pollen: Secondary | ICD-10-CM | POA: Diagnosis not present

## 2019-01-26 DIAGNOSIS — G4733 Obstructive sleep apnea (adult) (pediatric): Secondary | ICD-10-CM | POA: Diagnosis not present

## 2019-01-28 DIAGNOSIS — J3089 Other allergic rhinitis: Secondary | ICD-10-CM | POA: Diagnosis not present

## 2019-01-28 DIAGNOSIS — J301 Allergic rhinitis due to pollen: Secondary | ICD-10-CM | POA: Diagnosis not present

## 2019-02-03 DIAGNOSIS — J301 Allergic rhinitis due to pollen: Secondary | ICD-10-CM | POA: Diagnosis not present

## 2019-02-03 DIAGNOSIS — J3089 Other allergic rhinitis: Secondary | ICD-10-CM | POA: Diagnosis not present

## 2019-02-06 DIAGNOSIS — Z0000 Encounter for general adult medical examination without abnormal findings: Secondary | ICD-10-CM | POA: Diagnosis not present

## 2019-02-06 DIAGNOSIS — I10 Essential (primary) hypertension: Secondary | ICD-10-CM | POA: Diagnosis not present

## 2019-02-06 DIAGNOSIS — Z1211 Encounter for screening for malignant neoplasm of colon: Secondary | ICD-10-CM | POA: Diagnosis not present

## 2019-02-10 DIAGNOSIS — J3089 Other allergic rhinitis: Secondary | ICD-10-CM | POA: Diagnosis not present

## 2019-02-10 DIAGNOSIS — J301 Allergic rhinitis due to pollen: Secondary | ICD-10-CM | POA: Diagnosis not present

## 2019-02-17 DIAGNOSIS — J301 Allergic rhinitis due to pollen: Secondary | ICD-10-CM | POA: Diagnosis not present

## 2019-02-17 DIAGNOSIS — J3089 Other allergic rhinitis: Secondary | ICD-10-CM | POA: Diagnosis not present

## 2019-02-24 DIAGNOSIS — J301 Allergic rhinitis due to pollen: Secondary | ICD-10-CM | POA: Diagnosis not present

## 2019-02-24 DIAGNOSIS — J3089 Other allergic rhinitis: Secondary | ICD-10-CM | POA: Diagnosis not present

## 2019-03-02 DIAGNOSIS — G4733 Obstructive sleep apnea (adult) (pediatric): Secondary | ICD-10-CM | POA: Diagnosis not present

## 2019-03-03 DIAGNOSIS — J301 Allergic rhinitis due to pollen: Secondary | ICD-10-CM | POA: Diagnosis not present

## 2019-03-03 DIAGNOSIS — J3089 Other allergic rhinitis: Secondary | ICD-10-CM | POA: Diagnosis not present

## 2019-03-05 DIAGNOSIS — M67911 Unspecified disorder of synovium and tendon, right shoulder: Secondary | ICD-10-CM | POA: Diagnosis not present

## 2019-03-05 DIAGNOSIS — M25511 Pain in right shoulder: Secondary | ICD-10-CM | POA: Diagnosis not present

## 2019-03-07 ENCOUNTER — Other Ambulatory Visit: Payer: Self-pay

## 2019-03-07 ENCOUNTER — Emergency Department (HOSPITAL_BASED_OUTPATIENT_CLINIC_OR_DEPARTMENT_OTHER)
Admission: EM | Admit: 2019-03-07 | Discharge: 2019-03-07 | Disposition: A | Discharge status: Home / Self Care | Payer: BC Managed Care – PPO | Attending: Emergency Medicine | Admitting: Emergency Medicine

## 2019-03-07 ENCOUNTER — Encounter (HOSPITAL_BASED_OUTPATIENT_CLINIC_OR_DEPARTMENT_OTHER): Payer: Self-pay | Admitting: Emergency Medicine

## 2019-03-07 DIAGNOSIS — M25562 Pain in left knee: Secondary | ICD-10-CM | POA: Insufficient documentation

## 2019-03-07 MED ORDER — LIDOCAINE 5 % EX PTCH: 1.0000 | MEDICATED_PATCH | CUTANEOUS | 0 refills | Status: DC

## 2019-03-07 MED ORDER — TRAMADOL HCL 50 MG PO TABS: 50.0000 mg | ORAL_TABLET | Freq: Four times a day (QID) | ORAL | 0 refills | Status: DC | PRN

## 2019-03-07 NOTE — ED Triage Notes (Signed)
L knee pain x 4 days. Denies injury.

## 2019-03-07 NOTE — Discharge Instructions (Signed)
You were evaluated in the Emergency Department and after careful evaluation, we did not find any emergent condition requiring admission or further testing in the hospital.  Your symptoms today seem to be due to a flare of arthritis in the knee.  It could also be explained by inflammation or damage of the meniscus.  Please rest the knee for 1 or 2 weeks and use the medications provided.  We recommend Tylenol and ibuprofen for pain at home as well.  If not improving we recommend follow-up with your orthopedic doctor.  Please return to the Emergency Department if you experience any worsening of your condition.  We encourage you to follow up with a primary care provider.  Thank you for allowing [place] to be a part of your care.

## 2019-03-07 NOTE — ED Provider Notes (Signed)
[facility] Emergency Department Provider Note MRN:  [id number]  Arrival date & time: 03/07/19     Chief Complaint   Knee Pain   History of Present Illness   Rebecca Rojas is a 63 y.o. year-old female with a history of arthritis presenting to the ED with chief complaint of knee pain.  Pain in the left knee has been present for 4 days, constant, moderate in severity, worse with motion or palpation.  Patient explains that it began as she was stepping out of the car onto this leg, did not fall, but felt a popping sensation in the knee and it has hurt ever since.  Denies any other pain or symptoms.  Review of Systems  A complete 10 system review of systems was obtained and all systems are negative except as noted in the HPI and PMH.   Patient's Health History    Past Medical History:  Diagnosis Date  . Arthritis    osteoarthritis  . Headache(784.0)    occasional migraine  . History of seasonal allergies   . Shortness of breath    maybe with exertion  . Sleep apnea     Past Surgical History:  Procedure Laterality Date  . ABDOMINAL HYSTERECTOMY    . FRACTURE SURGERY     left 5th finger fx with bone graft repair  . TONSILLECTOMY    . TOTAL KNEE ARTHROPLASTY  08/03/2011   Procedure: TOTAL KNEE ARTHROPLASTY;  Surgeon: Harvie JuniorJohn L Graves;  Location: [facility] OR;  Service: Orthopedics;  Laterality: Right;  COMPUTER ASSISTED TOTAL KNEE REPLACEMENT with revision tibial component  . TUBAL LIGATION      Family History  Problem Relation Age of Onset  . Hypertension Mother   . Heart disease Mother   . Heart attack Mother   . Anesthesia problems Neg Hx   . Hypotension Neg Hx   . Malignant hyperthermia Neg Hx   . Pseudochol deficiency Neg Hx     Social History   Socioeconomic History  . Marital status: Married    Spouse name: Not on file  . Number of children: Not on file  . Years of education: Not on file  . Highest education level: Not on file   Occupational History  . Not on file  Social Needs  . Financial resource strain: Not on file  . Food insecurity    Worry: Not on file    Inability: Not on file  . Transportation needs    Medical: Not on file    Non-medical: Not on file  Tobacco Use  . Smoking status: Never Smoker  . Smokeless tobacco: Never Used  Substance and Sexual Activity  . Alcohol use: No  . Drug use: No  . Sexual activity: Yes  Lifestyle  . Physical activity    Days per week: Not on file    Minutes per session: Not on file  . Stress: Not on file  Relationships  . Social Musicianconnections    Talks on phone: Not on file    Gets together: Not on file    Attends religious service: Not on file    Active member of club or organization: Not on file    Attends meetings of clubs or organizations: Not on file    Relationship status: Not on file  . Intimate partner violence    Fear of current or ex partner: Not on file    Emotionally abused: Not on file    Physically abused: Not on  file    Forced sexual activity: Not on file  Other Topics Concern  . Not on file  Social History Narrative  . Not on file     Physical Exam  Vital Signs and Nursing Notes reviewed Vitals:   03/07/19 1020  BP: (!) 157/73  Pulse: 89  Resp: 20  Temp: 98 F (36.7 C)  SpO2: 100%    CONSTITUTIONAL: Well-appearing, NAD NEURO:  Alert and oriented x 3, no focal deficits EYES:  eyes equal and reactive ENT/NECK:  no LAD, no JVD CARDIO: Regular rate, well-perfused, normal S1 and S2 PULM:  CTAB no wheezing or rhonchi GI/GU:  normal bowel sounds, non-distended, non-tender MSK/SPINE:  No gross deformities, no edema; no erythema, no appreciable edema, preserved range of motion of the left knee, no laxity, pain is elicited with varus strain SKIN:  no rash, atraumatic PSYCH:  Appropriate speech and behavior  Diagnostic and Interventional Summary    Labs Reviewed - No data to display  No orders to display    Medications - No data to  display   Procedures Critical Care  ED Course and Medical Decision Making  I have reviewed the triage vital signs and the nursing notes.  Pertinent labs & imaging results that were available during my care of the patient were reviewed by me and considered in my medical decision making (see below for details).  Suspect flare of osteoarthritis or injury to the meniscus in this 63 year old female with exam noted above.  Nothing to suggest septic joint, no trauma to warrant imaging today.  Will move forward with conservative management, rest, pain control, patient will follow-up with orthopedist if not improved in 1 to 2 weeks.  After the discussed management above, the patient was determined to be safe for discharge.  The patient was in agreement with this plan and all questions regarding their care were answered.  ED return precautions were discussed and the patient will return to the ED with any significant worsening of condition.  Barth Kirks. [place] Small, [place] [email or web address]  Final Clinical Impressions(s) / ED Diagnoses     ICD-10-CM   1. Acute pain of left knee  M25.562     ED Discharge Orders         Ordered    lidocaine (LIDODERM) 5 %  Every 24 hours     03/07/19 1051    traMADol (ULTRAM) 50 MG tablet  Every 6 hours PRN     03/07/19 1051             Maudie Flakes, MD 03/07/19 1054

## 2019-03-09 DIAGNOSIS — M25562 Pain in left knee: Secondary | ICD-10-CM | POA: Diagnosis not present

## 2019-03-09 DIAGNOSIS — M1712 Unilateral primary osteoarthritis, left knee: Secondary | ICD-10-CM | POA: Diagnosis not present

## 2019-03-11 DIAGNOSIS — J3089 Other allergic rhinitis: Secondary | ICD-10-CM | POA: Diagnosis not present

## 2019-03-11 DIAGNOSIS — J301 Allergic rhinitis due to pollen: Secondary | ICD-10-CM | POA: Diagnosis not present

## 2019-03-13 DIAGNOSIS — Z1231 Encounter for screening mammogram for malignant neoplasm of breast: Secondary | ICD-10-CM | POA: Diagnosis not present

## 2019-03-13 DIAGNOSIS — M25511 Pain in right shoulder: Secondary | ICD-10-CM | POA: Diagnosis not present

## 2019-03-16 DIAGNOSIS — M25562 Pain in left knee: Secondary | ICD-10-CM | POA: Diagnosis not present

## 2019-03-16 DIAGNOSIS — M75121 Complete rotator cuff tear or rupture of right shoulder, not specified as traumatic: Secondary | ICD-10-CM | POA: Diagnosis not present

## 2019-03-16 DIAGNOSIS — M1712 Unilateral primary osteoarthritis, left knee: Secondary | ICD-10-CM | POA: Diagnosis not present

## 2019-03-17 DIAGNOSIS — J301 Allergic rhinitis due to pollen: Secondary | ICD-10-CM | POA: Diagnosis not present

## 2019-03-17 DIAGNOSIS — J3089 Other allergic rhinitis: Secondary | ICD-10-CM | POA: Diagnosis not present

## 2019-03-24 DIAGNOSIS — M25562 Pain in left knee: Secondary | ICD-10-CM | POA: Diagnosis not present

## 2019-03-25 DIAGNOSIS — J3089 Other allergic rhinitis: Secondary | ICD-10-CM | POA: Diagnosis not present

## 2019-03-25 DIAGNOSIS — J301 Allergic rhinitis due to pollen: Secondary | ICD-10-CM | POA: Diagnosis not present

## 2019-04-02 DIAGNOSIS — J3089 Other allergic rhinitis: Secondary | ICD-10-CM | POA: Diagnosis not present

## 2019-04-02 DIAGNOSIS — M25562 Pain in left knee: Secondary | ICD-10-CM | POA: Diagnosis not present

## 2019-04-02 DIAGNOSIS — J301 Allergic rhinitis due to pollen: Secondary | ICD-10-CM | POA: Diagnosis not present

## 2019-04-07 DIAGNOSIS — J301 Allergic rhinitis due to pollen: Secondary | ICD-10-CM | POA: Diagnosis not present

## 2019-04-07 DIAGNOSIS — J3089 Other allergic rhinitis: Secondary | ICD-10-CM | POA: Diagnosis not present

## 2019-04-13 DIAGNOSIS — J3089 Other allergic rhinitis: Secondary | ICD-10-CM | POA: Diagnosis not present

## 2019-04-13 DIAGNOSIS — J301 Allergic rhinitis due to pollen: Secondary | ICD-10-CM | POA: Diagnosis not present

## 2019-04-22 DIAGNOSIS — G4733 Obstructive sleep apnea (adult) (pediatric): Secondary | ICD-10-CM | POA: Diagnosis not present

## 2019-04-22 DIAGNOSIS — M6752 Plica syndrome, left knee: Secondary | ICD-10-CM | POA: Diagnosis not present

## 2019-04-22 DIAGNOSIS — M23222 Derangement of posterior horn of medial meniscus due to old tear or injury, left knee: Secondary | ICD-10-CM | POA: Diagnosis not present

## 2019-04-22 DIAGNOSIS — M94262 Chondromalacia, left knee: Secondary | ICD-10-CM | POA: Diagnosis not present

## 2019-04-28 DIAGNOSIS — J3089 Other allergic rhinitis: Secondary | ICD-10-CM | POA: Diagnosis not present

## 2019-04-28 DIAGNOSIS — J301 Allergic rhinitis due to pollen: Secondary | ICD-10-CM | POA: Diagnosis not present

## 2019-04-28 DIAGNOSIS — G4733 Obstructive sleep apnea (adult) (pediatric): Secondary | ICD-10-CM | POA: Diagnosis not present

## 2019-05-04 DIAGNOSIS — J3089 Other allergic rhinitis: Secondary | ICD-10-CM | POA: Diagnosis not present

## 2019-05-04 DIAGNOSIS — M6281 Muscle weakness (generalized): Secondary | ICD-10-CM | POA: Diagnosis not present

## 2019-05-04 DIAGNOSIS — J301 Allergic rhinitis due to pollen: Secondary | ICD-10-CM | POA: Diagnosis not present

## 2019-05-08 DIAGNOSIS — M6281 Muscle weakness (generalized): Secondary | ICD-10-CM | POA: Diagnosis not present

## 2019-05-11 DIAGNOSIS — M6281 Muscle weakness (generalized): Secondary | ICD-10-CM | POA: Diagnosis not present

## 2019-05-11 DIAGNOSIS — J3089 Other allergic rhinitis: Secondary | ICD-10-CM | POA: Diagnosis not present

## 2019-05-11 DIAGNOSIS — J301 Allergic rhinitis due to pollen: Secondary | ICD-10-CM | POA: Diagnosis not present

## 2019-05-13 DIAGNOSIS — M6281 Muscle weakness (generalized): Secondary | ICD-10-CM | POA: Diagnosis not present

## 2019-05-19 DIAGNOSIS — M6281 Muscle weakness (generalized): Secondary | ICD-10-CM | POA: Diagnosis not present

## 2019-05-19 DIAGNOSIS — J301 Allergic rhinitis due to pollen: Secondary | ICD-10-CM | POA: Diagnosis not present

## 2019-05-19 DIAGNOSIS — J3089 Other allergic rhinitis: Secondary | ICD-10-CM | POA: Diagnosis not present

## 2019-05-21 DIAGNOSIS — M6281 Muscle weakness (generalized): Secondary | ICD-10-CM | POA: Diagnosis not present

## 2019-05-26 DIAGNOSIS — J301 Allergic rhinitis due to pollen: Secondary | ICD-10-CM | POA: Diagnosis not present

## 2019-05-26 DIAGNOSIS — J3089 Other allergic rhinitis: Secondary | ICD-10-CM | POA: Diagnosis not present

## 2019-05-26 DIAGNOSIS — M6281 Muscle weakness (generalized): Secondary | ICD-10-CM | POA: Diagnosis not present

## 2019-05-28 DIAGNOSIS — M67911 Unspecified disorder of synovium and tendon, right shoulder: Secondary | ICD-10-CM | POA: Diagnosis not present

## 2019-05-28 DIAGNOSIS — M25562 Pain in left knee: Secondary | ICD-10-CM | POA: Diagnosis not present

## 2019-05-28 DIAGNOSIS — Z9889 Other specified postprocedural states: Secondary | ICD-10-CM | POA: Diagnosis not present

## 2019-05-28 DIAGNOSIS — M6281 Muscle weakness (generalized): Secondary | ICD-10-CM | POA: Diagnosis not present

## 2019-06-01 ENCOUNTER — Other Ambulatory Visit: Payer: Self-pay | Admitting: Orthopedic Surgery

## 2019-06-02 ENCOUNTER — Other Ambulatory Visit: Payer: Self-pay | Admitting: Orthopedic Surgery

## 2019-06-02 DIAGNOSIS — J301 Allergic rhinitis due to pollen: Secondary | ICD-10-CM | POA: Diagnosis not present

## 2019-06-02 DIAGNOSIS — J3089 Other allergic rhinitis: Secondary | ICD-10-CM | POA: Diagnosis not present

## 2019-06-02 DIAGNOSIS — M6281 Muscle weakness (generalized): Secondary | ICD-10-CM | POA: Diagnosis not present

## 2019-06-03 DIAGNOSIS — G4733 Obstructive sleep apnea (adult) (pediatric): Secondary | ICD-10-CM | POA: Diagnosis not present

## 2019-06-04 ENCOUNTER — Other Ambulatory Visit: Payer: Self-pay | Admitting: Orthopedic Surgery

## 2019-06-04 DIAGNOSIS — M6281 Muscle weakness (generalized): Secondary | ICD-10-CM | POA: Diagnosis not present

## 2019-06-08 ENCOUNTER — Other Ambulatory Visit: Payer: Self-pay | Admitting: Orthopedic Surgery

## 2019-06-08 NOTE — Progress Notes (Signed)
Per Anesthesia Guidelines pt will need to be done at Main OR due to BMI 50.5. Has hx OSA with CPAP, asthma/allergies.

## 2019-06-11 ENCOUNTER — Encounter (HOSPITAL_COMMUNITY): Payer: Self-pay

## 2019-06-11 ENCOUNTER — Other Ambulatory Visit (HOSPITAL_COMMUNITY)
Admission: RE | Admit: 2019-06-11 | Discharge: 2019-06-11 | Disposition: A | Discharge status: Home / Self Care | Payer: BC Managed Care – PPO | Source: Ambulatory Visit | Attending: Orthopedic Surgery | Admitting: Orthopedic Surgery

## 2019-06-11 DIAGNOSIS — Z20828 Contact with and (suspected) exposure to other viral communicable diseases: Secondary | ICD-10-CM | POA: Insufficient documentation

## 2019-06-11 DIAGNOSIS — Z9889 Other specified postprocedural states: Secondary | ICD-10-CM | POA: Diagnosis not present

## 2019-06-11 DIAGNOSIS — J3089 Other allergic rhinitis: Secondary | ICD-10-CM | POA: Diagnosis not present

## 2019-06-11 DIAGNOSIS — M75121 Complete rotator cuff tear or rupture of right shoulder, not specified as traumatic: Secondary | ICD-10-CM | POA: Diagnosis not present

## 2019-06-11 DIAGNOSIS — Z01812 Encounter for preprocedural laboratory examination: Secondary | ICD-10-CM | POA: Insufficient documentation

## 2019-06-11 NOTE — Patient Instructions (Signed)
DUE TO COVID-19 ONLY ONE VISITOR IS ALLOWED TO COME WITH YOU AND STAY IN THE WAITING ROOM ONLY DURING PRE OP AND PROCEDURE DAY OF SURGERY. THE 1 VISITOR MAY VISIT WITH YOU AFTER SURGERY IN YOUR PRIVATE ROOM DURING VISITING HOURS ONLY!  YOU NEEDED TO HAVE A COVID 19 TEST ON__10/22__, THIS TEST MUST BE DONE BEFORE SURGERY, COME  [address], [place] , [address].  [facility])  ONCE YOUR COVID TEST IS COMPLETED, PLEASE BEGIN THE QUARANTINE INSTRUCTIONS AS OUTLINED IN YOUR HANDOUT.                Rebecca Rojas    Your procedure is scheduled on: 06/15/19   Report to [facility] Main  Entrance   Report to admitting at   10:30 AM     Call this number if you have problems the morning of surgery [phone]    . BRUSH YOUR TEETH MORNING OF SURGERY AND RINSE YOUR MOUTH OUT, NO CHEWING GUM CANDY OR MINTS.    Do not eat food After Midnight     CLEAR LIQUID DIET   Foods Allowed                                                                     Foods Excluded  Coffee and tea, regular and decaf                             liquids that you cannot  Plain Jell-O any favor except red or purple                                           see through such as: Fruit ices (not with fruit pulp)                                     milk, soups, orange juice  Iced Popsicles                                    All solid food Carbonated beverages, regular and diet                                    Cranberry, grape and apple juices Sports drinks like Gatorade Lightly seasoned clear broth or consume(fat free) Sugar, honey syrup    _____________________________________________________________________    . YOU MAY HAVE CLEAR LIQUIDS FROM MIDNIGHT UNTIL 9:00 AM  . At 9:00 AM Please finish the prescribed Pre-Surgery Gatorade drink.   Nothing by mouth after you finish the Gatorade drink !   Take these medicines the morning of surgery with A SIP OF WATER:  Singulair,  Nasonex, use your inhalers and bring them to the hospital with you  DO NOT TAKE ANY DIABETIC MEDICATIONS DAY OF YOUR SURGERY  You may not have any metal on your body including hair pins and              piercings  Do not wear jewelry, make-up, lotions, powders or perfumes, deodorant             Do not wear nail polish on your fingernails.  Do not shave  48 hours prior to surgery.     Do not bring valuables to the hospital. [facility] IS NOT             RESPONSIBLE   FOR VALUABLES.  Contacts, dentures or bridgework may not be worn into surgery.      Patients discharged the day of surgery will not be allowed to drive home.   IF YOU ARE HAVING SURGERY AND GOING HOME THE SAME DAY, YOU MUST HAVE AN ADULT TO DRIVE YOU HOME AND BE WITH YOU FOR 24 HOURS.   YOU MAY GO HOME BY TAXI OR UBER OR ORTHERWISE, BUT AN ADULT MUST ACCOMPANY YOU HOME AND STAY WITH YOU FOR 24 HOURS.  Name and phone number of your driver:  Special Instructions: N/A              Please read over the following fact sheets you were given: _____________________________________________________________________             [facility] - Preparing for Surgery  Before surgery, you can play an important role .  Because skin is not sterile, your skin needs to be as free of germs as possible.   You can reduce the number of germs on your skin by washing with CHG (chlorahexidine gluconate) soap before surgery.   CHG is an antiseptic cleaner which kills germs and bonds with the skin to continue killing germs even after washing. Please DO NOT use if you have an allergy to CHG or antibacterial soaps.   If your skin becomes reddened/irritated stop using the CHG and inform your nurse when you arrive at Short Stay. Do not shave (including legs and underarms) for at least 48 hours prior to the first CHG shower.   Please follow these instructions carefully:  1.  Shower with CHG Soap the night before surgery  and the  morning of Surgery.  2.  If you choose to wash your hair, wash your hair first as usual with your  normal  shampoo.  3.  After you shampoo, rinse your hair and body thoroughly to remove the  shampoo.                                       4.  Use CHG as you would any other liquid soap.  You can apply chg directly  to the skin and wash                       Gently with a scrungie or clean washcloth.  5.  Apply the CHG Soap to your body ONLY FROM THE NECK DOWN.   Do not use on face/ open                           Wound or open sores. Avoid contact with eyes, ears mouth and genitals (private parts).  Wash face,  Genitals (private parts) with your normal soap.             6.  Wash thoroughly, paying special attention to the area where your surgery  will be performed.  7.  Thoroughly rinse your body with warm water from the neck down.  8.  DO NOT shower/wash with your normal soap after using and rinsing off  the CHG Soap.             9.  Pat yourself dry with a clean towel.            10.  Wear clean pajamas.            11.  Place clean sheets on your bed the night of your first shower and do not  sleep with pets. Day of Surgery : Do not apply any lotions/deodorants the morning of surgery.  Please wear clean clothes to the hospital/surgery center.  FAILURE TO FOLLOW THESE INSTRUCTIONS MAY RESULT IN THE CANCELLATION OF YOUR SURGERY PATIENT SIGNATURE_________________________________  NURSE SIGNATURE__________________________________  ________________________________________________________________________   Rogelia MireIncentive Spirometer  An incentive spirometer is a tool that can help keep your lungs clear and active. This tool measures how well you are filling your lungs with each breath. Taking long deep breaths may help reverse or decrease the chance of developing breathing (pulmonary) problems (especially infection) following:  A long period of time when you are unable  to move or be active. BEFORE THE PROCEDURE   If the spirometer includes an indicator to show your best effort, your nurse or respiratory therapist will set it to a desired goal.  If possible, sit up straight or lean slightly forward. Try not to slouch.  Hold the incentive spirometer in an upright position. INSTRUCTIONS FOR USE  1. Sit on the edge of your bed if possible, or sit up as far as you can in bed or on a chair. 2. Hold the incentive spirometer in an upright position. 3. Breathe out normally. 4. Place the mouthpiece in your mouth and seal your lips tightly around it. 5. Breathe in slowly and as deeply as possible, raising the piston or the ball toward the top of the column. 6. Hold your breath for 3-5 seconds or for as long as possible. Allow the piston or ball to fall to the bottom of the column. 7. Remove the mouthpiece from your mouth and breathe out normally. 8. Rest for a few seconds and repeat Steps 1 through 7 at least 10 times every 1-2 hours when you are awake. Take your time and take a few normal breaths between deep breaths. 9. The spirometer may include an indicator to show your best effort. Use the indicator as a goal to work toward during each repetition. 10. After each set of 10 deep breaths, practice coughing to be sure your lungs are clear. If you have an incision (the cut made at the time of surgery), support your incision when coughing by placing a pillow or rolled up towels firmly against it. Once you are able to get out of bed, walk around indoors and cough well. You may stop using the incentive spirometer when instructed by your caregiver.  RISKS AND COMPLICATIONS  Take your time so you do not get dizzy or light-headed.  If you are in pain, you may need to take or ask for pain medication before doing incentive spirometry. It is harder to take a deep breath if you are having pain. AFTER USE  Rest and breathe slowly and easily.  It can be helpful to keep track  of a log of your progress. Your caregiver can provide you with a simple table to help with this. If you are using the spirometer at home, follow these instructions: [place] IF:   You are having difficultly using the spirometer.  You have trouble using the spirometer as often as instructed.  Your pain medication is not giving enough relief while using the spirometer.  You develop fever of 100.5 F (38.1 C) or higher. SEEK IMMEDIATE MEDICAL CARE IF:   You cough up bloody sputum that had not been present before.  You develop fever of 102 F (38.9 C) or greater.  You develop worsening pain at or near the incision site. MAKE SURE YOU:   Understand these instructions.  Will watch your condition.  Will get help right away if you are not doing well or get worse. Document Released: 12/17/2006 Document Revised: 10/29/2011 Document Reviewed: 02/17/2007 [facility] Patient Information 2014 [place], [place].   ________________________________________________________________________

## 2019-06-12 ENCOUNTER — Other Ambulatory Visit: Payer: Self-pay

## 2019-06-12 ENCOUNTER — Encounter (HOSPITAL_COMMUNITY)
Admission: RE | Admit: 2019-06-12 | Discharge: 2019-06-12 | Disposition: A | Discharge status: Home / Self Care | Payer: BC Managed Care – PPO | Source: Ambulatory Visit | Attending: Orthopedic Surgery | Admitting: Orthopedic Surgery

## 2019-06-12 ENCOUNTER — Encounter (HOSPITAL_COMMUNITY): Payer: Self-pay

## 2019-06-12 DIAGNOSIS — Z01812 Encounter for preprocedural laboratory examination: Secondary | ICD-10-CM | POA: Diagnosis not present

## 2019-06-12 DIAGNOSIS — M75101 Unspecified rotator cuff tear or rupture of right shoulder, not specified as traumatic: Secondary | ICD-10-CM | POA: Diagnosis not present

## 2019-06-12 LAB — CBC
HCT: 44 % (ref 36.0–46.0)
Hemoglobin: 13.8 g/dL (ref 12.0–15.0)
MCH: 29.5 pg (ref 26.0–34.0)
MCHC: 31.4 g/dL (ref 30.0–36.0)
MCV: 94 fL (ref 80.0–100.0)
Platelets: 276 10*3/uL (ref 150–400)
RBC: 4.68 MIL/uL (ref 3.87–5.11)
RDW: 14 % (ref 11.5–15.5)
WBC: 9.4 10*3/uL (ref 4.0–10.5)
nRBC: 0 % (ref 0.0–0.2)

## 2019-06-12 LAB — SURGICAL PCR SCREEN
MRSA, PCR: NEGATIVE
Staphylococcus aureus: NEGATIVE

## 2019-06-12 NOTE — Progress Notes (Signed)
PCP - R. Nodal PA Cardiologist - no  Chest x-ray - no EKG - no Stress Test - no ECHO - no Cardiac Cath - no  Sleep Study - Yes  CPAP - Yes  Fasting Blood Sugar - NA Checks Blood Sugar _____ times a day  Blood Thinner Instructions:NA Aspirin Instructions: Last Dose:  Anesthesia review:   Patient denies shortness of breath, fever, cough and chest pain at PAT appointment yes  Patient verbalized understanding of instructions that were given to them at the PAT appointment. Patient was also instructed that they will need to review over the PAT instructions again at home before surgery. yes

## 2019-06-14 LAB — NOVEL CORONAVIRUS, NAA (HOSP ORDER, SEND-OUT TO REF LAB; TAT 18-24 HRS): SARS-CoV-2, NAA: NOT DETECTED

## 2019-06-14 MED ORDER — VANCOMYCIN HCL 10 G IV SOLR
1500.0000 mg | INTRAVENOUS | Status: AC
  Administered 2019-06-15: 11:00:00 1500 mg via INTRAVENOUS
  Filled 2019-06-14: qty 1500

## 2019-06-15 ENCOUNTER — Encounter (HOSPITAL_COMMUNITY)
Admission: RE | Disposition: A | Discharge status: Home / Self Care | Payer: Self-pay | Source: Home / Self Care | Attending: Orthopedic Surgery

## 2019-06-15 ENCOUNTER — Ambulatory Visit (HOSPITAL_COMMUNITY)
Admission: RE | Admit: 2019-06-15 | Discharge: 2019-06-15 | Disposition: A | Discharge status: Home / Self Care | Payer: BC Managed Care – PPO | Attending: Orthopedic Surgery | Admitting: Orthopedic Surgery

## 2019-06-15 ENCOUNTER — Telehealth (HOSPITAL_COMMUNITY): Payer: Self-pay | Admitting: *Deleted

## 2019-06-15 ENCOUNTER — Ambulatory Visit (HOSPITAL_COMMUNITY): Payer: BC Managed Care – PPO | Admitting: Certified Registered"

## 2019-06-15 ENCOUNTER — Encounter (HOSPITAL_COMMUNITY): Payer: Self-pay

## 2019-06-15 DIAGNOSIS — Z885 Allergy status to narcotic agent status: Secondary | ICD-10-CM | POA: Diagnosis not present

## 2019-06-15 DIAGNOSIS — S43431A Superior glenoid labrum lesion of right shoulder, initial encounter: Secondary | ICD-10-CM | POA: Diagnosis not present

## 2019-06-15 DIAGNOSIS — M75111 Incomplete rotator cuff tear or rupture of right shoulder, not specified as traumatic: Secondary | ICD-10-CM | POA: Insufficient documentation

## 2019-06-15 DIAGNOSIS — Z79899 Other long term (current) drug therapy: Secondary | ICD-10-CM | POA: Insufficient documentation

## 2019-06-15 DIAGNOSIS — X58XXXA Exposure to other specified factors, initial encounter: Secondary | ICD-10-CM | POA: Insufficient documentation

## 2019-06-15 DIAGNOSIS — Z881 Allergy status to other antibiotic agents status: Secondary | ICD-10-CM | POA: Insufficient documentation

## 2019-06-15 DIAGNOSIS — G4730 Sleep apnea, unspecified: Secondary | ICD-10-CM | POA: Diagnosis not present

## 2019-06-15 DIAGNOSIS — M75101 Unspecified rotator cuff tear or rupture of right shoulder, not specified as traumatic: Secondary | ICD-10-CM | POA: Diagnosis present

## 2019-06-15 DIAGNOSIS — M19011 Primary osteoarthritis, right shoulder: Secondary | ICD-10-CM

## 2019-06-15 DIAGNOSIS — Z96651 Presence of right artificial knee joint: Secondary | ICD-10-CM | POA: Diagnosis not present

## 2019-06-15 DIAGNOSIS — G8918 Other acute postprocedural pain: Secondary | ICD-10-CM | POA: Diagnosis not present

## 2019-06-15 DIAGNOSIS — M7541 Impingement syndrome of right shoulder: Secondary | ICD-10-CM | POA: Insufficient documentation

## 2019-06-15 HISTORY — DX: Unspecified rotator cuff tear or rupture of right shoulder, not specified as traumatic: M75.101

## 2019-06-15 HISTORY — DX: Superior glenoid labrum lesion of right shoulder, initial encounter: S43.431A

## 2019-06-15 HISTORY — PX: SHOULDER ARTHROSCOPY WITH ROTATOR CUFF REPAIR AND SUBACROMIAL DECOMPRESSION: SHX5686

## 2019-06-15 HISTORY — DX: Primary osteoarthritis, right shoulder: M19.011

## 2019-06-15 SURGERY — SHOULDER ARTHROSCOPY WITH ROTATOR CUFF REPAIR AND SUBACROMIAL DECOMPRESSION
Anesthesia: General | Laterality: Right

## 2019-06-15 MED ORDER — SODIUM CHLORIDE 0.9 % IV SOLN
INTRAVENOUS | Status: DC | PRN
  Administered 2019-06-15: 13:00:00 20 ug/min via INTRAVENOUS

## 2019-06-15 MED ORDER — 0.9 % SODIUM CHLORIDE (POUR BTL) OPTIME
TOPICAL | Status: DC | PRN
  Administered 2019-06-15: 1000 mL

## 2019-06-15 MED ORDER — DEXAMETHASONE SODIUM PHOSPHATE 10 MG/ML IJ SOLN
INTRAMUSCULAR | Status: AC
  Filled 2019-06-15: qty 1

## 2019-06-15 MED ORDER — PROPOFOL 10 MG/ML IV BOLUS
INTRAVENOUS | Status: DC | PRN
  Administered 2019-06-15: 150 mg via INTRAVENOUS

## 2019-06-15 MED ORDER — LACTATED RINGERS IR SOLN
Status: DC | PRN
  Administered 2019-06-15: 18000 mL
  Administered 2019-06-15: 3000 mL

## 2019-06-15 MED ORDER — MIDAZOLAM HCL 2 MG/2ML IJ SOLN
1.0000 mg | INTRAMUSCULAR | Status: DC
  Administered 2019-06-15 (×2): 1 mg via INTRAVENOUS
  Filled 2019-06-15: qty 2

## 2019-06-15 MED ORDER — LACTATED RINGERS IV SOLN
INTRAVENOUS | Status: DC
  Administered 2019-06-15: 11:00:00 via INTRAVENOUS

## 2019-06-15 MED ORDER — ONDANSETRON HCL 4 MG/2ML IJ SOLN
INTRAMUSCULAR | Status: AC
  Filled 2019-06-15: qty 2

## 2019-06-15 MED ORDER — LIDOCAINE 2% (20 MG/ML) 5 ML SYRINGE
INTRAMUSCULAR | Status: AC
  Filled 2019-06-15: qty 5

## 2019-06-15 MED ORDER — SUGAMMADEX SODIUM 200 MG/2ML IV SOLN
INTRAVENOUS | Status: DC | PRN
  Administered 2019-06-15: 200 mg via INTRAVENOUS

## 2019-06-15 MED ORDER — DEXAMETHASONE SODIUM PHOSPHATE 10 MG/ML IJ SOLN
INTRAMUSCULAR | Status: DC | PRN
  Administered 2019-06-15: 8 mg via INTRAVENOUS

## 2019-06-15 MED ORDER — ROCURONIUM BROMIDE 10 MG/ML (PF) SYRINGE
PREFILLED_SYRINGE | INTRAVENOUS | Status: DC | PRN
  Administered 2019-06-15 (×3): 10 mg via INTRAVENOUS
  Administered 2019-06-15: 60 mg via INTRAVENOUS
  Administered 2019-06-15: 10 mg via INTRAVENOUS

## 2019-06-15 MED ORDER — LIDOCAINE 2% (20 MG/ML) 5 ML SYRINGE
INTRAMUSCULAR | Status: DC | PRN
  Administered 2019-06-15: 40 mg via INTRAVENOUS

## 2019-06-15 MED ORDER — ROCURONIUM BROMIDE 10 MG/ML (PF) SYRINGE
PREFILLED_SYRINGE | INTRAVENOUS | Status: AC
  Filled 2019-06-15: qty 10

## 2019-06-15 MED ORDER — FENTANYL CITRATE (PF) 100 MCG/2ML IJ SOLN
INTRAMUSCULAR | Status: AC
  Filled 2019-06-15: qty 2

## 2019-06-15 MED ORDER — PHENYLEPHRINE 40 MCG/ML (10ML) SYRINGE FOR IV PUSH (FOR BLOOD PRESSURE SUPPORT)
PREFILLED_SYRINGE | INTRAVENOUS | Status: DC | PRN
  Administered 2019-06-15 (×2): 80 ug via INTRAVENOUS

## 2019-06-15 MED ORDER — ONDANSETRON HCL 4 MG/2ML IJ SOLN
INTRAMUSCULAR | Status: DC | PRN
  Administered 2019-06-15: 4 mg via INTRAVENOUS

## 2019-06-15 MED ORDER — FENTANYL CITRATE (PF) 250 MCG/5ML IJ SOLN
INTRAMUSCULAR | Status: DC | PRN
  Administered 2019-06-15 (×2): 50 ug via INTRAVENOUS

## 2019-06-15 MED ORDER — PROPOFOL 10 MG/ML IV BOLUS
INTRAVENOUS | Status: AC
  Filled 2019-06-15: qty 20

## 2019-06-15 MED ORDER — ONDANSETRON HCL 4 MG/2ML IJ SOLN: 4.0000 mg | Freq: Once | INTRAMUSCULAR | Status: DC | PRN

## 2019-06-15 MED ORDER — CHLORHEXIDINE GLUCONATE 4 % EX LIQD: 60.0000 mL | Freq: Once | CUTANEOUS | Status: DC

## 2019-06-15 MED ORDER — FENTANYL CITRATE (PF) 100 MCG/2ML IJ SOLN
50.0000 ug | INTRAMUSCULAR | Status: DC
  Administered 2019-06-15 (×2): 50 ug via INTRAVENOUS
  Filled 2019-06-15: qty 2

## 2019-06-15 MED ORDER — BUPIVACAINE HCL (PF) 0.5 % IJ SOLN
INTRAMUSCULAR | Status: AC
  Filled 2019-06-15: qty 30

## 2019-06-15 MED ORDER — OXYCODONE-ACETAMINOPHEN 5-325 MG PO TABS: 1.0000 | ORAL_TABLET | Freq: Four times a day (QID) | ORAL | 0 refills | Status: DC | PRN

## 2019-06-15 MED ORDER — FENTANYL CITRATE (PF) 100 MCG/2ML IJ SOLN: 25.0000 ug | INTRAMUSCULAR | Status: DC | PRN

## 2019-06-15 MED ORDER — EPINEPHRINE PF 1 MG/ML IJ SOLN
INTRAMUSCULAR | Status: AC
  Filled 2019-06-15: qty 1

## 2019-06-15 MED ORDER — TIZANIDINE HCL 2 MG PO TABS: 2.0000 mg | ORAL_TABLET | Freq: Three times a day (TID) | ORAL | 0 refills | Status: DC | PRN

## 2019-06-15 SURGICAL SUPPLY — 66 items
ANCHOR PEEK 4.75X19.1 SWLK C (Anchor) ×6 IMPLANT
BLADE EXCALIBUR 4.0MM X 13CM (MISCELLANEOUS) ×1
BLADE EXCALIBUR 4.0X13 (MISCELLANEOUS) ×2 IMPLANT
BLADE SURG SZ11 CARB STEEL (BLADE) ×3 IMPLANT
BOOTIES KNEE HIGH SLOAN (MISCELLANEOUS) ×6 IMPLANT
BURR OVAL 8 FLU 4.0MM X 13CM (MISCELLANEOUS) ×1
BURR OVAL 8 FLU 4.0X13 (MISCELLANEOUS) ×2 IMPLANT
CANNULA ACUFLEX KIT 5X76 (CANNULA) ×3 IMPLANT
CANNULA DRILOCK 5.0MMX75MM (CANNULA)
CANNULA DRILOCK 5.0X75 (CANNULA) IMPLANT
CANNULA TWIST IN 8.25X7CM (CANNULA) ×3 IMPLANT
CHLORAPREP W/TINT 26 (MISCELLANEOUS) ×6 IMPLANT
CLOSURE WOUND 1/2 X4 (GAUZE/BANDAGES/DRESSINGS) ×1
CONNECTOR 5 IN 1 STRAIGHT STRL (MISCELLANEOUS) ×3 IMPLANT
COVER WAND RF STERILE (DRAPES) ×3 IMPLANT
DECANTER SPIKE VIAL GLASS SM (MISCELLANEOUS) ×3 IMPLANT
DISSECTOR  3.8MM X 13CM (MISCELLANEOUS) ×2
DISSECTOR 3.8MM X 13CM (MISCELLANEOUS) ×1 IMPLANT
DRAPE IMP U-DRAPE 54X76 (DRAPES) ×3 IMPLANT
DRAPE INCISE 23X17 IOBAN STRL (DRAPES) ×2
DRAPE INCISE IOBAN 23X17 STRL (DRAPES) ×1 IMPLANT
DRAPE INCISE IOBAN 66X45 STRL (DRAPES) ×3 IMPLANT
DRAPE ORTHO SPLIT 77X108 STRL (DRAPES) ×4
DRAPE STERI 35X30 U-POUCH (DRAPES) ×3 IMPLANT
DRAPE SURG 17X11 SM STRL (DRAPES) ×3 IMPLANT
DRAPE SURG ORHT 6 SPLT 77X108 (DRAPES) ×2 IMPLANT
DRAPE U-SHAPE 47X51 STRL (DRAPES) ×3 IMPLANT
DRSG EMULSION OIL 3X3 NADH (GAUZE/BANDAGES/DRESSINGS) ×3 IMPLANT
DRSG PAD ABDOMINAL 8X10 ST (GAUZE/BANDAGES/DRESSINGS) ×9 IMPLANT
DW OUTFLOW CASSETTE/TUBE SET (MISCELLANEOUS) ×3 IMPLANT
GAUZE 4X4 16PLY RFD (DISPOSABLE) ×3 IMPLANT
GAUZE SPONGE 4X4 12PLY STRL (GAUZE/BANDAGES/DRESSINGS) ×3 IMPLANT
GLOVE BIO SURGEON STRL SZ8 (GLOVE) ×3 IMPLANT
GLOVE BIOGEL PI IND STRL 8 (GLOVE) ×2 IMPLANT
GLOVE BIOGEL PI INDICATOR 8 (GLOVE) ×4
GLOVE ECLIPSE 7.5 STRL STRAW (GLOVE) ×3 IMPLANT
GOWN STRL REUS W/ TWL XL LVL3 (GOWN DISPOSABLE) ×1 IMPLANT
GOWN STRL REUS W/TWL XL LVL3 (GOWN DISPOSABLE) ×8 IMPLANT
KIT BASIN OR (CUSTOM PROCEDURE TRAY) ×3 IMPLANT
KIT SHOULDER TRACTION (DRAPES) ×3 IMPLANT
KIT TURNOVER KIT A (KITS) IMPLANT
MANIFOLD NEPTUNE II (INSTRUMENTS) ×3 IMPLANT
NEEDLE SCORPION MULTI FIRE (NEEDLE) ×3 IMPLANT
NEEDLE SPNL 18GX3.5 QUINCKE PK (NEEDLE) ×3 IMPLANT
NS IRRIG 1000ML POUR BTL (IV SOLUTION) ×3 IMPLANT
PACK ARTHROSCOPY DSU (CUSTOM PROCEDURE TRAY) ×3 IMPLANT
PAD ARMBOARD 7.5X6 YLW CONV (MISCELLANEOUS) ×6 IMPLANT
PORT APPOLLO RF 90DEGREE MULTI (SURGICAL WAND) ×3 IMPLANT
PROBE APOLLO 90XL (SURGICAL WAND) ×3 IMPLANT
RESTRAINT HEAD UNIVERSAL NS (MISCELLANEOUS) ×3 IMPLANT
SLEEVE SURGEON STRL (DRAPES) ×3 IMPLANT
SLING ARM FOAM STRAP LRG (SOFTGOODS) IMPLANT
SLING ARM FOAM STRAP MED (SOFTGOODS) IMPLANT
SLING ARM FOAM STRAP XLG (SOFTGOODS) ×3 IMPLANT
SPONGE LAP 18X18 RF (DISPOSABLE) ×3 IMPLANT
SPONGE LAP 4X18 RFD (DISPOSABLE) IMPLANT
STRIP CLOSURE SKIN 1/2X4 (GAUZE/BANDAGES/DRESSINGS) ×2 IMPLANT
SUT ETHILON 3 0 PS 1 (SUTURE) ×6 IMPLANT
SUT MNCRL AB 3-0 PS2 18 (SUTURE) ×3 IMPLANT
SUT PDS AB 0 CT 36 (SUTURE) ×3 IMPLANT
SUT TIGER TAPE 7 IN WHITE (SUTURE) ×3 IMPLANT
SYR 20ML LL LF (SYRINGE) ×3 IMPLANT
TAPE PAPER 3X10 WHT MICROPORE (GAUZE/BANDAGES/DRESSINGS) ×3 IMPLANT
TOWEL OR 17X26 10 PK STRL BLUE (TOWEL DISPOSABLE) ×6 IMPLANT
TUBING ARTHRO INFLOW-ONLY STRL (TUBING) ×3 IMPLANT
WATER STERILE IRR 1000ML POUR (IV SOLUTION) ×3 IMPLANT

## 2019-06-15 NOTE — Anesthesia Procedure Notes (Signed)
Procedure Name: Intubation Date/Time: 06/15/2019 12:40 PM Performed by: Eben Burow, CRNA Pre-anesthesia Checklist: Patient identified, Emergency Drugs available, Suction available, Patient being monitored and Timeout performed Patient Re-evaluated:Patient Re-evaluated prior to induction Oxygen Delivery Method: Circle system utilized Preoxygenation: Pre-oxygenation with 100% oxygen Induction Type: IV induction Ventilation: Mask ventilation without difficulty Laryngoscope Size: Mac and 4 Grade View: Grade I Tube type: Oral Tube size: 7.0 mm Number of attempts: 1 Airway Equipment and Method: Stylet Placement Confirmation: ETT inserted through vocal cords under direct vision,  positive ETCO2 and breath sounds checked- equal and bilateral Secured at: 21 cm Tube secured with: Tape Dental Injury: Teeth and Oropharynx as per pre-operative assessment

## 2019-06-15 NOTE — Discharge Instructions (Signed)

## 2019-06-15 NOTE — Anesthesia Procedure Notes (Addendum)
Anesthesia Regional Block: Interscalene brachial plexus block   Pre-Anesthetic Checklist: ,, timeout performed, Correct Patient, Correct Site, Correct Laterality, Correct Procedure, Correct Position, site marked, Risks and benefits discussed,  Surgical consent,  Pre-op evaluation,  At surgeon's request and post-op pain management  Laterality: Right  Prep: chloraprep       Needles:  Injection technique: Single-shot  Needle Type: Stimulator Needle - 40      Needle Gauge: 22     Additional Needles:   Procedures:, nerve stimulator,,,,,,,  Narrative:  Start time: 06/15/2019 12:20 PM End time: 06/15/2019 12:30 PM Injection made incrementally with aspirations every 5 mL.  Performed by: Personally  Anesthesiologist: Roberts Gaudy, MD  Additional Notes: 10 cc 0.5% Bupivacaine with 1:200 epi 10 cc 1.3% Exparel

## 2019-06-15 NOTE — Anesthesia Preprocedure Evaluation (Signed)
Anesthesia Evaluation  Patient identified by MRN, date of birth, ID band Patient awake    Reviewed: Allergy & Precautions, NPO status , Patient's Chart, lab work & pertinent test results  Airway Mallampati: II  TM Distance: >3 FB Neck ROM: Full    Dental  (+) Teeth Intact, Dental Advisory Given   Pulmonary    breath sounds clear to auscultation       Cardiovascular  Rhythm:Regular Rate:Normal     Neuro/Psych    GI/Hepatic   Endo/Other    Renal/GU      Musculoskeletal   Abdominal (+) + obese,   Peds  Hematology   Anesthesia Other Findings   Reproductive/Obstetrics                             Anesthesia Physical Anesthesia Plan  ASA: III  Anesthesia Plan: General   Post-op Pain Management:  Regional for Post-op pain   Induction: Intravenous  PONV Risk Score and Plan: Ondansetron and Dexamethasone  Airway Management Planned: Oral ETT  Additional Equipment:   Intra-op Plan:   Post-operative Plan: Extubation in OR  Informed Consent: I have reviewed the patients History and Physical, chart, labs and discussed the procedure including the risks, benefits and alternatives for the proposed anesthesia with the patient or authorized representative who has indicated his/her understanding and acceptance.     Dental advisory given  Plan Discussed with: CRNA and Anesthesiologist  Anesthesia Plan Comments: (Obesity Allergic asthma lungs clear OSA on CPAP  Plan GA with ETT and interscalene block. )        Anesthesia Quick Evaluation

## 2019-06-15 NOTE — Transfer of Care (Signed)
Immediate Anesthesia Transfer of Care Note  Patient: Rebecca Rojas  Procedure(s) Performed: SHOULDER ARTHROSCOPY WITH ROTATOR CUFF REPAIR AND SUBACROMIAL DECOMPRESSION (Right )  Patient Location: PACU  Anesthesia Type:General  Level of Consciousness: sedated  Airway & Oxygen Therapy: Patient Spontanous Breathing and Patient connected to face mask oxygen  Post-op Assessment: Report given to RN and Post -op Vital signs reviewed and stable  Post vital signs: Reviewed and stable  Last Vitals:  Vitals Value Taken Time  BP 104/70 06/15/19 1449  Temp    Pulse 87 06/15/19 1449  Resp 25 06/15/19 1449  SpO2 100 % 06/15/19 1449  Vitals shown include unvalidated device data.  Last Pain:  Vitals:   06/15/19 1222  TempSrc:   PainSc: 0-No pain         Complications: No apparent anesthesia complications

## 2019-06-15 NOTE — Progress Notes (Addendum)
Assisted Dr. Linna Caprice with right, ultrasound guided, interscalene  block. 10 ml exparel given.  Side rails up, monitors on throughout procedure. See vital signs in flow sheet. Tolerated Procedure well.

## 2019-06-15 NOTE — Op Note (Signed)
NAME: Rebecca Rojas, Rebecca Rojas MEDICAL RECORD AO:[id number] ACCOUNT [id number] DATE OF BIRTH:October 05, 1955 FACILITY: [facility] LOCATION: [facility] PHYSICIAN:Clemma Johnsen Maudie Mercury, MD  OPERATIVE REPORT  DATE OF PROCEDURE:  06/15/2019  PREOPERATIVE DIAGNOSES:   1.  Rotator cuff tear, right. 2.  Impingement. 3.  Acromioclavicular joint arthritis.  POSTOPERATIVE DIAGNOSES:   1.  Rotator cuff tear, right. 2.  Impingement. 3.  Acromioclavicular joint arthritis. 4.  Anterior superior labral tear.  PROCEDURE:  1.  Arthroscopic subacromial decompression from a lateral and posterior compartment. 2.  Arthroscopic distal clavicle resection over 20 mm from the anterior compartment. 3.  Arthroscopic debridement of anterior superior labral tear. 4.  Arthroscopic rotator cuff repair with a double row technique.  SURGEON:  Dorna Leitz, MD  ASSISTANT:  None.  ANESTHESIA:  General.  BRIEF HISTORY:  The patient is a 64 year old female with a long history of significant complaints of right shoulder pain.  She has been treated conservatively for a prolonged period of time.  MRI was obtained and showed that she had a high-grade partial  thickness rotator cuff tear.  My reading was that it was a full thickness tear.  She was treated conservatively and after several months with continued pain, we felt that evaluation and fixation as needed was appropriate.  She was brought to the  operating room for this procedure.  DESCRIPTION OF PROCEDURE:  The patient was brought to the operating room.  After adequate anesthesia was obtained with general anesthetic, the patient was placed supine on the operating table and was moved into beach chair position.  All bony prominences  were well padded.  Attention was then turned to the right shoulder.  After routine prep and drape, arthroscopic examination showed that there were no significant glenohumeral arthritis.  There was an anterior superior labral tear, which was debrided  with a  suction shaver back to a smooth and stable rim.  The biceps tendon was evaluated and noted to be functioning and without evidence of tear.  The lateral superior portion of the supraspinatus was identified from the undersurface.  It had some fray.   This was debrided with a suction shaver.  It appeared to be pretty thin.  I took a spinal needle and marked this area.  The spinal needle basically came right through it at that area, but at any rate, I marked it with a PDS suture.  We then moved out of  the glenohumeral joint and into the subacromial space, did a debridement of the bursa that was there and then went and found our suture.  It was in the midst basically of an essentially full-thickness tear that could be identified pretty easily.  At  this point, I made an inferolateral portal and was able to identify this tear directly and ____.  I used the anterior portal now that was created to debride bursa and other tissue from around the cuff and was then able to identify the tear front to back  using Arthrocare used to free the last couple of fibers which were remaining on the shoulder side.  Once this was done, I could easily put a probe in, front and back of the tear.  At this point, I felt given her age and large size, it was probably best  to get this repaired as best we could.  So it was decided on a double row repair as opposed to a single row repair.  I put a percutaneous anchor in from the lateral side in the dead center  portion of this tear and then took the 2 limbs of the FiberTape  and passed them anterior and posterior and then brought these over to a lateral SwiveLock and got an excellent repair of the rotator cuff with this technique.  At this point, the tape was cut and the stitches were removed from the SwiveLocks.  At this  point, attention was turned towards the decompression.  I removed the camera back to the posterior portal and did a decompression from the lateral side.  Once this was  done, we then moved to the distal clavicle and distal clavicle resection was performed  over 20 mm from both the lateral and then from the anterior portal.  At this point, the wounds were irrigated and suctioned dry.  I went back to the lateral side and did a thorough debridement with a motorized shaver.  Once the bursectomy had been  completed, attention was turned towards removing all of the arthroscopic instruments, put interrupted nylons in some of the larger portals, which were needed for the arthroscopic repair.  At this point, a sterile compressive dressing applied and she was  placed into a shoulder sling and taken to the recovery room.  She was noted to be in satisfactory condition.  Estimated blood loss for the procedure was minimal.  VN/NUANCE  D:06/15/2019 T:06/15/2019 JOB:[id number]

## 2019-06-15 NOTE — H&P (Signed)
A pre op hand p   Chief Complaint: Right shoulder pain  HPI: Rebecca Rojas is a 63 y.o. female who presents for evaluation of right shoulder pain. It has been present for greater than 3 months and has been worsening. She has failed conservative measures. Pain is rated as moderate.  Past Medical History:  Diagnosis Date  . Arthritis    osteoarthritis  . Headache(784.0)    occasional migraine  . History of seasonal allergies   . Shortness of breath    maybe with exertion  . Sleep apnea    Past Surgical History:  Procedure Laterality Date  . ABDOMINAL HYSTERECTOMY    . FRACTURE SURGERY     left 5th finger fx with bone graft repair  . JOINT REPLACEMENT    . TONSILLECTOMY    . TOTAL KNEE ARTHROPLASTY  08/03/2011   Procedure: TOTAL KNEE ARTHROPLASTY;  Surgeon: Alta Corning;  Location: [facility];  Service: Orthopedics;  Laterality: Right;  COMPUTER ASSISTED TOTAL KNEE REPLACEMENT with revision tibial component  . TUBAL LIGATION     Social History   Socioeconomic History  . Marital status: Married    Spouse name: Not on file  . Number of children: Not on file  . Years of education: Not on file  . Highest education level: Not on file  Occupational History  . Not on file  Social Needs  . Financial resource strain: Not on file  . Food insecurity    Worry: Not on file    Inability: Not on file  . Transportation needs    Medical: Not on file    Non-medical: Not on file  Tobacco Use  . Smoking status: Never Smoker  . Smokeless tobacco: Never Used  Substance and Sexual Activity  . Alcohol use: No  . Drug use: No  . Sexual activity: Yes  Lifestyle  . Physical activity    Days per week: Not on file    Minutes per session: Not on file  . Stress: Not on file  Relationships  . Social Herbalist on phone: Not on file    Gets together: Not on file    Attends religious service: Not on file    Active member of club or organization: Not on file    Attends meetings of  clubs or organizations: Not on file    Relationship status: Not on file  Other Topics Concern  . Not on file  Social History Narrative  . Not on file   Family History  Problem Relation Age of Onset  . Hypertension Mother   . Heart disease Mother   . Heart attack Mother   . Anesthesia problems Neg Hx   . Hypotension Neg Hx   . Malignant hyperthermia Neg Hx   . Pseudochol deficiency Neg Hx    Allergies  Allergen Reactions  . Cefdinir Itching  . Hydrocodone-Homatropine Rash   Prior to Admission medications   Medication Sig Start Date End Date Taking? Authorizing Provider  albuterol (PROVENTIL HFA;VENTOLIN HFA) 108 (90 BASE) MCG/ACT inhaler Inhale 1-2 puffs into the lungs every 6 (six) hours as needed for wheezing or shortness of breath.    Yes [provider]  EPINEPHrine (EPIPEN 2-PAK) 0.3 mg/0.3 mL IJ SOAJ injection Inject 0.3 mg into the muscle as needed for anaphylaxis.   Yes [provider]  fluticasone furoate-vilanterol (BREO ELLIPTA) 200-25 MCG/INH AEPB Inhale 1 puff into the lungs daily.  01/05/18  Yes [provider]  furosemide (LASIX) 20 MG tablet Take 20 mg by mouth daily as needed (fluid retention.).   Yes [provider]  HYDROcodone-acetaminophen (NORCO/VICODIN) 5-325 MG tablet Take 1 tablet by mouth daily as needed (severe knee pain.).  03/16/19  Yes [provider]  levocetirizine (XYZAL) 5 MG tablet Take 5 mg by mouth every evening.     Yes [provider]  mometasone (NASONEX) 50 MCG/ACT nasal spray Place 2 sprays into the nose daily. 02/11/13  Yes Julianne Rice, MD  montelukast (SINGULAIR) 10 MG tablet Take 10 mg by mouth at bedtime.     Yes [provider]  PRESCRIPTION MEDICATION Inject 1 kit into the skin every 7 (seven) days. Allergy shot unknown to pt    Yes [provider]  albuterol (PROVENTIL) (2.5 MG/3ML) 0.083% nebulizer solution Inhale 2.5 mg into the lungs every 4 (four) hours as  needed for wheezing or shortness of breath. 04/20/19   [provider]  azelastine (OPTIVAR) 0.05 % ophthalmic solution Place 1 drop into both eyes 2 (two) times daily as needed (allergy eyes.).    [provider]  lidocaine (LIDODERM) 5 % Place 1 patch onto the skin daily. Remove & Discard patch within 12 hours or as directed by MD Patient not taking: Reported on 06/09/2019 03/07/19   Maudie Flakes, MD  SUMAtriptan (IMITREX) 100 MG tablet Take 100 mg by mouth every 2 (two) hours as needed for migraine.     [provider]  traMADol (ULTRAM) 50 MG tablet Take 1 tablet (50 mg total) by mouth every 6 (six) hours as needed. Patient not taking: Reported on 06/09/2019 03/07/19   Maudie Flakes, MD     Positive ROS: None  All other systems have been reviewed and were otherwise negative with the exception of those mentioned in the HPI and as above.  Physical Exam: Vitals:   06/15/19 1033  BP: (!) 141/88  Pulse: 94  Resp: 20  Temp: 97.8 F (36.6 C)  SpO2: 100%    General: Alert, no acute distress Cardiovascular: No pedal edema Respiratory: No cyanosis, no use of accessory musculature GI: No organomegaly, abdomen is soft and non-tender Skin: No lesions in the area of chief complaint Neurologic: Sensation intact distally Psychiatric: Patient is competent for consent with normal mood and affect Lymphatic: No axillary or cervical lymphadenopathy  MUSCULOSKELETAL: Right shoulder: Painful external rotation.  Market positive primary and secondary impingement findings.  No instability.  MRI: MRI shows high-grade partial rotator cuff tear.  Assessment/Plan: RIGHT SHOULDER ROTATOR CUFF TEAR Plan for Procedure(s): SHOULDER ARTHROSCOPY WITH ROTATOR CUFF REPAIR AND SUBACROMIAL DECOMPRESSION  The risks benefits and alternatives were discussed with the patient including but not limited to the risks of nonoperative treatment, versus surgical intervention including  infection, bleeding, nerve injury, malunion, nonunion, hardware prominence, hardware failure, need for hardware removal, blood clots, cardiopulmonary complications, morbidity, mortality, among others, and they were willing to proceed.  Predicted outcome is good, although there will be at least a six to nine month expected recovery.  Alta Corning, MD 06/15/2019 12:21 PM

## 2019-06-15 NOTE — Brief Op Note (Signed)
06/15/2019  3:01 PM  PATIENT:  Rebecca Rojas  63 y.o. female  PRE-OPERATIVE DIAGNOSIS:  RIGHT SHOULDER ROTATOR CUFF TEAR  POST-OPERATIVE DIAGNOSIS:  RIGHT SHOULDER ROTATOR CUFF TEAR  PROCEDURE:  Procedure(s): SHOULDER ARTHROSCOPY WITH ROTATOR CUFF REPAIR AND SUBACROMIAL DECOMPRESSION (Right)  SURGEON:  Surgeon(s) and Role:    Dorna Leitz, MD - Primary  PHYSICIAN ASSISTANT:   ASSISTANTS: none   ANESTHESIA:   general  EBL:  20 mL   BLOOD ADMINISTERED:none  DRAINS: none   LOCAL MEDICATIONS USED:      SPECIMEN:  No Specimen  DISPOSITION OF SPECIMEN:  N/A  COUNTS:  YES  TOURNIQUET:  * No tourniquets in log *  DICTATION: .Other Dictation: Dictation Number [id number]  PLAN OF CARE: Discharge to home after PACU  PATIENT DISPOSITION:  PACU - hemodynamically stable.   Delay start of Pharmacological VTE agent (>24hrs) due to surgical blood loss or risk of bleeding: no

## 2019-06-16 ENCOUNTER — Encounter (HOSPITAL_COMMUNITY): Payer: Self-pay | Admitting: Orthopedic Surgery

## 2019-06-18 DIAGNOSIS — J301 Allergic rhinitis due to pollen: Secondary | ICD-10-CM | POA: Diagnosis not present

## 2019-06-18 DIAGNOSIS — J3089 Other allergic rhinitis: Secondary | ICD-10-CM | POA: Diagnosis not present

## 2019-06-18 NOTE — Anesthesia Postprocedure Evaluation (Signed)
Anesthesia Post Note  Patient: Rebecca Rojas  Procedure(s) Performed: SHOULDER ARTHROSCOPY WITH ROTATOR CUFF REPAIR AND SUBACROMIAL DECOMPRESSION (Right )     Patient location during evaluation: PACU Anesthesia Type: General Level of consciousness: awake Pain management: pain level controlled Respiratory status: spontaneous breathing Cardiovascular status: stable Postop Assessment: no apparent nausea or vomiting Anesthetic complications: no    Last Vitals:  Vitals:   06/15/19 1622 06/15/19 1645  BP:  (!) 156/99  Pulse: 79 92  Resp: 13 18  Temp: 36.4 C 36.5 C  SpO2: 94% 93%    Last Pain:  Vitals:   06/15/19 1645  TempSrc:   PainSc: 0-No pain   Pain Goal:                   [name]

## 2019-06-19 DIAGNOSIS — M75101 Unspecified rotator cuff tear or rupture of right shoulder, not specified as traumatic: Secondary | ICD-10-CM | POA: Diagnosis not present

## 2019-06-19 DIAGNOSIS — M25611 Stiffness of right shoulder, not elsewhere classified: Secondary | ICD-10-CM | POA: Diagnosis not present

## 2019-06-19 DIAGNOSIS — J301 Allergic rhinitis due to pollen: Secondary | ICD-10-CM | POA: Diagnosis not present

## 2019-06-19 DIAGNOSIS — J3089 Other allergic rhinitis: Secondary | ICD-10-CM | POA: Diagnosis not present

## 2019-06-19 DIAGNOSIS — Z4789 Encounter for other orthopedic aftercare: Secondary | ICD-10-CM | POA: Diagnosis not present

## 2019-06-23 DIAGNOSIS — M75101 Unspecified rotator cuff tear or rupture of right shoulder, not specified as traumatic: Secondary | ICD-10-CM | POA: Diagnosis not present

## 2019-06-23 DIAGNOSIS — Z4789 Encounter for other orthopedic aftercare: Secondary | ICD-10-CM | POA: Diagnosis not present

## 2019-06-23 DIAGNOSIS — M25611 Stiffness of right shoulder, not elsewhere classified: Secondary | ICD-10-CM | POA: Diagnosis not present

## 2019-06-23 DIAGNOSIS — J3089 Other allergic rhinitis: Secondary | ICD-10-CM | POA: Diagnosis not present

## 2019-06-23 DIAGNOSIS — J301 Allergic rhinitis due to pollen: Secondary | ICD-10-CM | POA: Diagnosis not present

## 2019-06-29 ENCOUNTER — Encounter: Payer: Self-pay | Admitting: Pulmonary Disease

## 2019-06-29 ENCOUNTER — Other Ambulatory Visit: Payer: Self-pay

## 2019-06-29 ENCOUNTER — Ambulatory Visit (INDEPENDENT_AMBULATORY_CARE_PROVIDER_SITE_OTHER): Payer: BC Managed Care – PPO | Admitting: Pulmonary Disease

## 2019-06-29 VITALS — BP 130/76 | HR 67 | Temp 98.1°F | Ht 61.0 in | Wt 268.6 lb

## 2019-06-29 DIAGNOSIS — G4730 Sleep apnea, unspecified: Secondary | ICD-10-CM

## 2019-06-29 DIAGNOSIS — G4733 Obstructive sleep apnea (adult) (pediatric): Secondary | ICD-10-CM

## 2019-06-29 DIAGNOSIS — E669 Obesity, unspecified: Secondary | ICD-10-CM | POA: Diagnosis not present

## 2019-06-29 NOTE — Patient Instructions (Signed)
Follow up in 1 year.

## 2019-06-29 NOTE — Progress Notes (Signed)
[facility]  Chief Complaint  Patient presents with  . OSA (obstructive sleep apnea)    Constitutional:  BP 130/76 (BP Location: Left Arm, Patient Position: Sitting, Cuff Size: Large)   Pulse 67   Temp 98.1 F (36.7 C)   Ht 5' 1" (1.549 m)   Wt 268 lb 9.6 oz (121.8 kg)   SpO2 100% Comment: on room air  BMI 50.75 kg/m   Past Medical History:  Allergies, Asthma, OA, HA  Brief Summary:  Rebecca Rojas is a 63 y.o. female with obstructive sleep apnea.  She is doing well with CPAP.  No issues with mask fit.  Has occasional dry mouth.  Not having sinus congestion, sore throat, or aerophagia.  Had shoulder surgery few weeks ago.  Still in a sling.   Physical Exam:   Appearance - well kempt   ENMT - clear nasal mucosa, midline nasal  septum, no oral exudates, no LAN, trachea midline  Respiratory - normal chest wall, normal respiratory effort, no accessory muscle use, no wheeze/rales  CV - s1s2 regular rate and rhythm, no murmurs, no peripheral edema, radial pulses symmetric  GI - soft, non tender, no masses  Lymph - no adenopathy noted in neck and axillary areas  MSK - normal gait  Ext - no cyanosis, clubbing, or joint inflammation noted  Skin - no rashes, lesions, or ulcers  Neuro - normal strength, oriented x 3  Psych - normal mood and affect   Assessment/Plan:   Obstructive sleep apnea. - she is compliant with CPAP and reports benefit - continue CPAP 9 cm H2O  Obesity. - discussed importance of weight loss  Allergic asthma. - followed by Dr. Mosetta Anis   Patient Instructions  Follow up in 1 year    Chesley Mires, MD [place] Pager: [phone] 06/29/2019, 12:37 PM  Flow Sheet    Sleep tests:  CPAP 03/19/18 to 04/17/18 >> used on 28 of 30 nights with average 6 hrs 30 min.  Average AHI 0.3 with CPAP 9 cm H2O.   Medications:   Allergies as of 06/29/2019      Reactions   Cefdinir Itching   Hydrocodone-homatropine Rash      Medication List       Accurate as of June 29, 2019 12:37 PM. If you have any questions, ask your nurse or doctor.        albuterol 108 (90 Base) MCG/ACT inhaler Commonly known as: VENTOLIN HFA Inhale 1-2 puffs into the lungs every 6 (six) hours as needed for wheezing or shortness of breath.   albuterol (2.5 MG/3ML) 0.083% nebulizer solution Commonly known as: PROVENTIL Inhale 2.5 mg into the lungs every 4 (four) hours as needed for wheezing or shortness of breath.   azelastine 0.05 % ophthalmic solution Commonly known as: OPTIVAR Place 1 drop into both eyes 2 (two) times daily as needed (allergy eyes.).   Breo Ellipta 200-25 MCG/INH Aepb Generic drug: fluticasone furoate-vilanterol Inhale 1 puff into the lungs daily.   EpiPen 2-Pak 0.3 mg/0.3 mL Soaj injection Generic drug: EPINEPHrine Inject 0.3 mg into the muscle as needed for anaphylaxis.   furosemide 20 MG tablet Commonly known as: LASIX Take 20 mg by mouth daily as needed (fluid retention.).   levocetirizine 5 MG tablet Commonly known as: XYZAL Take 5 mg by mouth every evening.   mometasone 50 MCG/ACT nasal spray Commonly known as: Nasonex Place 2 sprays into the nose daily.   montelukast 10  MG tablet Commonly known as: SINGULAIR Take 10 mg by mouth at bedtime.   oxyCODONE-acetaminophen 5-325 MG tablet Commonly known as: PERCOCET/ROXICET Take 1-2 tablets by mouth every 6 (six) hours as needed for severe pain.   PRESCRIPTION MEDICATION Inject 1 kit into the skin every 7 (seven) days. Allergy shot unknown to pt   SUMAtriptan 100 MG tablet Commonly known as: IMITREX Take 100 mg by mouth every 2 (two) hours as needed for migraine.   tiZANidine 2 MG tablet Commonly known as: ZANAFLEX Take 1 tablet (2 mg total) by mouth every 8 (eight) hours as needed for muscle spasms.       Past Surgical History:  She  has a past surgical history that includes  Tonsillectomy; Fracture surgery; Abdominal hysterectomy; Tubal ligation; Total knee arthroplasty (08/03/2011); Joint replacement; and Shoulder arthroscopy with rotator cuff repair and subacromial decompression (Right, 06/15/2019).  Family History:  Her family history includes Heart attack in her mother; Heart disease in her mother; Hypertension in her mother.  Social History:  She  reports that she has never smoked. She has never used smokeless tobacco. She reports that she does not drink alcohol or use drugs.

## 2019-07-01 DIAGNOSIS — M75101 Unspecified rotator cuff tear or rupture of right shoulder, not specified as traumatic: Secondary | ICD-10-CM | POA: Diagnosis not present

## 2019-07-01 DIAGNOSIS — Z4789 Encounter for other orthopedic aftercare: Secondary | ICD-10-CM | POA: Diagnosis not present

## 2019-07-01 DIAGNOSIS — M25611 Stiffness of right shoulder, not elsewhere classified: Secondary | ICD-10-CM | POA: Diagnosis not present

## 2019-07-03 DIAGNOSIS — J3089 Other allergic rhinitis: Secondary | ICD-10-CM | POA: Diagnosis not present

## 2019-07-03 DIAGNOSIS — J301 Allergic rhinitis due to pollen: Secondary | ICD-10-CM | POA: Diagnosis not present

## 2019-07-08 DIAGNOSIS — J3089 Other allergic rhinitis: Secondary | ICD-10-CM | POA: Diagnosis not present

## 2019-07-08 DIAGNOSIS — Z4789 Encounter for other orthopedic aftercare: Secondary | ICD-10-CM | POA: Diagnosis not present

## 2019-07-08 DIAGNOSIS — M75101 Unspecified rotator cuff tear or rupture of right shoulder, not specified as traumatic: Secondary | ICD-10-CM | POA: Diagnosis not present

## 2019-07-08 DIAGNOSIS — M25611 Stiffness of right shoulder, not elsewhere classified: Secondary | ICD-10-CM | POA: Diagnosis not present

## 2019-07-14 DIAGNOSIS — Z9889 Other specified postprocedural states: Secondary | ICD-10-CM | POA: Diagnosis not present

## 2019-07-15 DIAGNOSIS — M25611 Stiffness of right shoulder, not elsewhere classified: Secondary | ICD-10-CM | POA: Diagnosis not present

## 2019-07-15 DIAGNOSIS — M75101 Unspecified rotator cuff tear or rupture of right shoulder, not specified as traumatic: Secondary | ICD-10-CM | POA: Diagnosis not present

## 2019-07-15 DIAGNOSIS — J301 Allergic rhinitis due to pollen: Secondary | ICD-10-CM | POA: Diagnosis not present

## 2019-07-15 DIAGNOSIS — Z4789 Encounter for other orthopedic aftercare: Secondary | ICD-10-CM | POA: Diagnosis not present

## 2019-07-15 DIAGNOSIS — J3089 Other allergic rhinitis: Secondary | ICD-10-CM | POA: Diagnosis not present

## 2019-07-22 DIAGNOSIS — M25611 Stiffness of right shoulder, not elsewhere classified: Secondary | ICD-10-CM | POA: Diagnosis not present

## 2019-07-22 DIAGNOSIS — J3089 Other allergic rhinitis: Secondary | ICD-10-CM | POA: Diagnosis not present

## 2019-07-22 DIAGNOSIS — M75101 Unspecified rotator cuff tear or rupture of right shoulder, not specified as traumatic: Secondary | ICD-10-CM | POA: Diagnosis not present

## 2019-07-22 DIAGNOSIS — Z4789 Encounter for other orthopedic aftercare: Secondary | ICD-10-CM | POA: Diagnosis not present

## 2019-07-27 DIAGNOSIS — M75101 Unspecified rotator cuff tear or rupture of right shoulder, not specified as traumatic: Secondary | ICD-10-CM | POA: Diagnosis not present

## 2019-07-27 DIAGNOSIS — Z4789 Encounter for other orthopedic aftercare: Secondary | ICD-10-CM | POA: Diagnosis not present

## 2019-07-27 DIAGNOSIS — M25611 Stiffness of right shoulder, not elsewhere classified: Secondary | ICD-10-CM | POA: Diagnosis not present

## 2019-07-29 DIAGNOSIS — Z4789 Encounter for other orthopedic aftercare: Secondary | ICD-10-CM | POA: Diagnosis not present

## 2019-07-29 DIAGNOSIS — M25611 Stiffness of right shoulder, not elsewhere classified: Secondary | ICD-10-CM | POA: Diagnosis not present

## 2019-07-29 DIAGNOSIS — M75101 Unspecified rotator cuff tear or rupture of right shoulder, not specified as traumatic: Secondary | ICD-10-CM | POA: Diagnosis not present

## 2019-07-29 DIAGNOSIS — J3089 Other allergic rhinitis: Secondary | ICD-10-CM | POA: Diagnosis not present

## 2019-08-03 DIAGNOSIS — M75101 Unspecified rotator cuff tear or rupture of right shoulder, not specified as traumatic: Secondary | ICD-10-CM | POA: Diagnosis not present

## 2019-08-03 DIAGNOSIS — M25611 Stiffness of right shoulder, not elsewhere classified: Secondary | ICD-10-CM | POA: Diagnosis not present

## 2019-08-03 DIAGNOSIS — Z4789 Encounter for other orthopedic aftercare: Secondary | ICD-10-CM | POA: Diagnosis not present

## 2019-08-03 DIAGNOSIS — J301 Allergic rhinitis due to pollen: Secondary | ICD-10-CM | POA: Diagnosis not present

## 2019-08-03 DIAGNOSIS — J3089 Other allergic rhinitis: Secondary | ICD-10-CM | POA: Diagnosis not present

## 2019-08-05 DIAGNOSIS — M75101 Unspecified rotator cuff tear or rupture of right shoulder, not specified as traumatic: Secondary | ICD-10-CM | POA: Diagnosis not present

## 2019-08-05 DIAGNOSIS — M25611 Stiffness of right shoulder, not elsewhere classified: Secondary | ICD-10-CM | POA: Diagnosis not present

## 2019-08-05 DIAGNOSIS — Z4789 Encounter for other orthopedic aftercare: Secondary | ICD-10-CM | POA: Diagnosis not present

## 2019-08-10 DIAGNOSIS — Z4789 Encounter for other orthopedic aftercare: Secondary | ICD-10-CM | POA: Diagnosis not present

## 2019-08-10 DIAGNOSIS — M75101 Unspecified rotator cuff tear or rupture of right shoulder, not specified as traumatic: Secondary | ICD-10-CM | POA: Diagnosis not present

## 2019-08-10 DIAGNOSIS — M25611 Stiffness of right shoulder, not elsewhere classified: Secondary | ICD-10-CM | POA: Diagnosis not present

## 2019-08-11 DIAGNOSIS — J3089 Other allergic rhinitis: Secondary | ICD-10-CM | POA: Diagnosis not present

## 2019-08-11 DIAGNOSIS — J301 Allergic rhinitis due to pollen: Secondary | ICD-10-CM | POA: Diagnosis not present

## 2019-08-12 DIAGNOSIS — M75101 Unspecified rotator cuff tear or rupture of right shoulder, not specified as traumatic: Secondary | ICD-10-CM | POA: Diagnosis not present

## 2019-08-12 DIAGNOSIS — Z4789 Encounter for other orthopedic aftercare: Secondary | ICD-10-CM | POA: Diagnosis not present

## 2019-08-12 DIAGNOSIS — M25611 Stiffness of right shoulder, not elsewhere classified: Secondary | ICD-10-CM | POA: Diagnosis not present

## 2019-08-17 DIAGNOSIS — Z4789 Encounter for other orthopedic aftercare: Secondary | ICD-10-CM | POA: Diagnosis not present

## 2019-08-17 DIAGNOSIS — M25611 Stiffness of right shoulder, not elsewhere classified: Secondary | ICD-10-CM | POA: Diagnosis not present

## 2019-08-17 DIAGNOSIS — J3089 Other allergic rhinitis: Secondary | ICD-10-CM | POA: Diagnosis not present

## 2019-08-17 DIAGNOSIS — J301 Allergic rhinitis due to pollen: Secondary | ICD-10-CM | POA: Diagnosis not present

## 2019-08-17 DIAGNOSIS — M75101 Unspecified rotator cuff tear or rupture of right shoulder, not specified as traumatic: Secondary | ICD-10-CM | POA: Diagnosis not present

## 2019-08-19 DIAGNOSIS — M25611 Stiffness of right shoulder, not elsewhere classified: Secondary | ICD-10-CM | POA: Diagnosis not present

## 2019-08-19 DIAGNOSIS — M75101 Unspecified rotator cuff tear or rupture of right shoulder, not specified as traumatic: Secondary | ICD-10-CM | POA: Diagnosis not present

## 2019-08-19 DIAGNOSIS — Z4789 Encounter for other orthopedic aftercare: Secondary | ICD-10-CM | POA: Diagnosis not present

## 2019-08-24 DIAGNOSIS — Z4789 Encounter for other orthopedic aftercare: Secondary | ICD-10-CM | POA: Diagnosis not present

## 2019-08-24 DIAGNOSIS — J301 Allergic rhinitis due to pollen: Secondary | ICD-10-CM | POA: Diagnosis not present

## 2019-08-24 DIAGNOSIS — M75101 Unspecified rotator cuff tear or rupture of right shoulder, not specified as traumatic: Secondary | ICD-10-CM | POA: Diagnosis not present

## 2019-08-24 DIAGNOSIS — J3089 Other allergic rhinitis: Secondary | ICD-10-CM | POA: Diagnosis not present

## 2019-08-24 DIAGNOSIS — M25611 Stiffness of right shoulder, not elsewhere classified: Secondary | ICD-10-CM | POA: Diagnosis not present

## 2019-08-26 DIAGNOSIS — Z4789 Encounter for other orthopedic aftercare: Secondary | ICD-10-CM | POA: Diagnosis not present

## 2019-08-26 DIAGNOSIS — M75101 Unspecified rotator cuff tear or rupture of right shoulder, not specified as traumatic: Secondary | ICD-10-CM | POA: Diagnosis not present

## 2019-08-26 DIAGNOSIS — M25611 Stiffness of right shoulder, not elsewhere classified: Secondary | ICD-10-CM | POA: Diagnosis not present

## 2019-08-31 DIAGNOSIS — M75101 Unspecified rotator cuff tear or rupture of right shoulder, not specified as traumatic: Secondary | ICD-10-CM | POA: Diagnosis not present

## 2019-08-31 DIAGNOSIS — Z4789 Encounter for other orthopedic aftercare: Secondary | ICD-10-CM | POA: Diagnosis not present

## 2019-08-31 DIAGNOSIS — M25611 Stiffness of right shoulder, not elsewhere classified: Secondary | ICD-10-CM | POA: Diagnosis not present

## 2019-09-01 ENCOUNTER — Encounter (INDEPENDENT_AMBULATORY_CARE_PROVIDER_SITE_OTHER): Payer: Self-pay | Admitting: Family Medicine

## 2019-09-01 ENCOUNTER — Ambulatory Visit (INDEPENDENT_AMBULATORY_CARE_PROVIDER_SITE_OTHER): Payer: BC Managed Care – PPO | Admitting: Family Medicine

## 2019-09-01 ENCOUNTER — Other Ambulatory Visit: Payer: Self-pay

## 2019-09-01 VITALS — BP 179/75 | HR 72 | Temp 98.0°F | Ht 61.0 in | Wt 265.0 lb

## 2019-09-01 DIAGNOSIS — E559 Vitamin D deficiency, unspecified: Secondary | ICD-10-CM | POA: Diagnosis not present

## 2019-09-01 DIAGNOSIS — Z1331 Encounter for screening for depression: Secondary | ICD-10-CM

## 2019-09-01 DIAGNOSIS — R5383 Other fatigue: Secondary | ICD-10-CM

## 2019-09-01 DIAGNOSIS — Z9189 Other specified personal risk factors, not elsewhere classified: Secondary | ICD-10-CM

## 2019-09-01 DIAGNOSIS — R0602 Shortness of breath: Secondary | ICD-10-CM

## 2019-09-01 DIAGNOSIS — J3089 Other allergic rhinitis: Secondary | ICD-10-CM | POA: Diagnosis not present

## 2019-09-01 DIAGNOSIS — R030 Elevated blood-pressure reading, without diagnosis of hypertension: Secondary | ICD-10-CM

## 2019-09-01 DIAGNOSIS — Z6841 Body Mass Index (BMI) 40.0 and over, adult: Secondary | ICD-10-CM

## 2019-09-01 DIAGNOSIS — R739 Hyperglycemia, unspecified: Secondary | ICD-10-CM | POA: Diagnosis not present

## 2019-09-01 DIAGNOSIS — Z0289 Encounter for other administrative examinations: Secondary | ICD-10-CM

## 2019-09-02 DIAGNOSIS — M75101 Unspecified rotator cuff tear or rupture of right shoulder, not specified as traumatic: Secondary | ICD-10-CM | POA: Diagnosis not present

## 2019-09-02 DIAGNOSIS — Z4789 Encounter for other orthopedic aftercare: Secondary | ICD-10-CM | POA: Diagnosis not present

## 2019-09-02 DIAGNOSIS — M25611 Stiffness of right shoulder, not elsewhere classified: Secondary | ICD-10-CM | POA: Diagnosis not present

## 2019-09-02 LAB — COMPREHENSIVE METABOLIC PANEL
ALT: 21 IU/L (ref 0–32)
AST: 17 IU/L (ref 0–40)
Albumin/Globulin Ratio: 1.6 (ref 1.2–2.2)
Albumin: 4.1 g/dL (ref 3.8–4.8)
Alkaline Phosphatase: 92 IU/L (ref 39–117)
BUN/Creatinine Ratio: 20 (ref 12–28)
BUN: 11 mg/dL (ref 8–27)
Bilirubin Total: 0.5 mg/dL (ref 0.0–1.2)
CO2: 24 mmol/L (ref 20–29)
Calcium: 8.6 mg/dL — ABNORMAL LOW (ref 8.7–10.3)
Chloride: 105 mmol/L (ref 96–106)
Creatinine, Ser: 0.56 mg/dL — ABNORMAL LOW (ref 0.57–1.00)
GFR calc Af Amer: 115 mL/min/{1.73_m2} (ref >59)
GFR calc non Af Amer: 100 mL/min/{1.73_m2} (ref >59)
Globulin, Total: 2.5 g/dL (ref 1.5–4.5)
Glucose: 92 mg/dL (ref 65–99)
Potassium: 4.2 mmol/L (ref 3.5–5.2)
Sodium: 142 mmol/L (ref 134–144)
Total Protein: 6.6 g/dL (ref 6.0–8.5)

## 2019-09-02 LAB — LIPID PANEL WITH LDL/HDL RATIO
Cholesterol, Total: 165 mg/dL (ref 100–199)
HDL: 47 mg/dL (ref >39)
LDL Chol Calc (NIH): 92 mg/dL (ref 0–99)
LDL/HDL Ratio: 2 ratio (ref 0.0–3.2)
Triglycerides: 151 mg/dL — ABNORMAL HIGH (ref 0–149)
VLDL Cholesterol Cal: 26 mg/dL (ref 5–40)

## 2019-09-02 LAB — T3: T3, Total: 132 ng/dL (ref 71–180)

## 2019-09-02 LAB — FOLATE: Folate: 10.8 ng/mL (ref >3.0)

## 2019-09-02 LAB — CBC WITH DIFFERENTIAL/PLATELET
Basophils Absolute: 0.1 10*3/uL (ref 0.0–0.2)
Basos: 1 %
EOS (ABSOLUTE): 0.2 10*3/uL (ref 0.0–0.4)
Eos: 3 %
Hematocrit: 41.7 % (ref 34.0–46.6)
Hemoglobin: 13.6 g/dL (ref 11.1–15.9)
Immature Grans (Abs): 0 10*3/uL (ref 0.0–0.1)
Immature Granulocytes: 0 %
Lymphocytes Absolute: 2.6 10*3/uL (ref 0.7–3.1)
Lymphs: 33 %
MCH: 29.2 pg (ref 26.6–33.0)
MCHC: 32.6 g/dL (ref 31.5–35.7)
MCV: 90 fL (ref 79–97)
Monocytes Absolute: 0.6 10*3/uL (ref 0.1–0.9)
Monocytes: 8 %
Neutrophils Absolute: 4.4 10*3/uL (ref 1.4–7.0)
Neutrophils: 55 %
Platelets: 309 10*3/uL (ref 150–450)
RBC: 4.65 x10E6/uL (ref 3.77–5.28)
RDW: 12.7 % (ref 11.7–15.4)
WBC: 7.9 10*3/uL (ref 3.4–10.8)

## 2019-09-02 LAB — VITAMIN D 25 HYDROXY (VIT D DEFICIENCY, FRACTURES): Vit D, 25-Hydroxy: 17.8 ng/mL — ABNORMAL LOW (ref 30.0–100.0)

## 2019-09-02 LAB — VITAMIN B12: Vitamin B-12: 474 pg/mL (ref 232–1245)

## 2019-09-02 LAB — HEMOGLOBIN A1C
Est. average glucose Bld gHb Est-mCnc: 111 mg/dL
Hgb A1c MFr Bld: 5.5 % (ref 4.8–5.6)

## 2019-09-02 LAB — INSULIN, RANDOM: INSULIN: 14 u[IU]/mL (ref 2.6–24.9)

## 2019-09-02 LAB — T4, FREE: Free T4: 1.33 ng/dL (ref 0.82–1.77)

## 2019-09-02 LAB — TSH: TSH: 2.77 u[IU]/mL (ref 0.450–4.500)
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
## 2019-09-07 DIAGNOSIS — M25611 Stiffness of right shoulder, not elsewhere classified: Secondary | ICD-10-CM | POA: Diagnosis not present

## 2019-09-07 DIAGNOSIS — Z4789 Encounter for other orthopedic aftercare: Secondary | ICD-10-CM | POA: Diagnosis not present

## 2019-09-07 DIAGNOSIS — M75101 Unspecified rotator cuff tear or rupture of right shoulder, not specified as traumatic: Secondary | ICD-10-CM | POA: Diagnosis not present

## 2019-09-09 DIAGNOSIS — M75101 Unspecified rotator cuff tear or rupture of right shoulder, not specified as traumatic: Secondary | ICD-10-CM | POA: Diagnosis not present

## 2019-09-09 DIAGNOSIS — Z4789 Encounter for other orthopedic aftercare: Secondary | ICD-10-CM | POA: Diagnosis not present

## 2019-09-09 DIAGNOSIS — M25611 Stiffness of right shoulder, not elsewhere classified: Secondary | ICD-10-CM | POA: Diagnosis not present

## 2019-09-10 DIAGNOSIS — J301 Allergic rhinitis due to pollen: Secondary | ICD-10-CM | POA: Diagnosis not present

## 2019-09-10 DIAGNOSIS — J3089 Other allergic rhinitis: Secondary | ICD-10-CM | POA: Diagnosis not present

## 2019-09-14 DIAGNOSIS — M25611 Stiffness of right shoulder, not elsewhere classified: Secondary | ICD-10-CM | POA: Diagnosis not present

## 2019-09-14 DIAGNOSIS — Z4789 Encounter for other orthopedic aftercare: Secondary | ICD-10-CM | POA: Diagnosis not present

## 2019-09-14 DIAGNOSIS — M75101 Unspecified rotator cuff tear or rupture of right shoulder, not specified as traumatic: Secondary | ICD-10-CM | POA: Diagnosis not present

## 2019-09-15 ENCOUNTER — Ambulatory Visit (INDEPENDENT_AMBULATORY_CARE_PROVIDER_SITE_OTHER): Payer: Self-pay | Admitting: Family Medicine

## 2019-09-16 ENCOUNTER — Ambulatory Visit (INDEPENDENT_AMBULATORY_CARE_PROVIDER_SITE_OTHER): Payer: BC Managed Care – PPO | Admitting: Family Medicine

## 2019-09-16 ENCOUNTER — Encounter (INDEPENDENT_AMBULATORY_CARE_PROVIDER_SITE_OTHER): Payer: Self-pay | Admitting: Family Medicine

## 2019-09-16 ENCOUNTER — Other Ambulatory Visit: Payer: Self-pay

## 2019-09-16 VITALS — BP 134/70 | HR 92 | Temp 97.9°F | Ht 61.0 in | Wt 258.0 lb

## 2019-09-16 DIAGNOSIS — Z4789 Encounter for other orthopedic aftercare: Secondary | ICD-10-CM | POA: Diagnosis not present

## 2019-09-16 DIAGNOSIS — Z6841 Body Mass Index (BMI) 40.0 and over, adult: Secondary | ICD-10-CM

## 2019-09-16 DIAGNOSIS — E8881 Metabolic syndrome: Secondary | ICD-10-CM | POA: Diagnosis not present

## 2019-09-16 DIAGNOSIS — E559 Vitamin D deficiency, unspecified: Secondary | ICD-10-CM

## 2019-09-16 DIAGNOSIS — E782 Mixed hyperlipidemia: Secondary | ICD-10-CM | POA: Diagnosis not present

## 2019-09-16 DIAGNOSIS — Z9189 Other specified personal risk factors, not elsewhere classified: Secondary | ICD-10-CM

## 2019-09-16 DIAGNOSIS — M25611 Stiffness of right shoulder, not elsewhere classified: Secondary | ICD-10-CM | POA: Diagnosis not present

## 2019-09-16 DIAGNOSIS — M75101 Unspecified rotator cuff tear or rupture of right shoulder, not specified as traumatic: Secondary | ICD-10-CM | POA: Diagnosis not present

## 2019-09-16 MED ORDER — VITAMIN D (ERGOCALCIFEROL) 1.25 MG (50000 UNIT) PO CAPS: 50000.0000 [IU] | ORAL_CAPSULE | ORAL | 0 refills | Status: DC

## 2019-09-17 NOTE — Progress Notes (Signed)
Chief Complaint:   OBESITY Rebecca Rojas is here to discuss her progress with her obesity treatment plan along with follow-up of her obesity related diagnoses. Rebecca Rojas is on the Category 2 Plan and states she is following her eating plan approximately 80% of the time. Rebecca Rojas states she is doing 0 minutes 0 times per week.  Today's visit was #: 2 Starting weight: 265 lbs Starting date: 09/01/2019 Today's weight: 258 lbs Today's date: 09/16/2019 Total lbs lost to date: 7 Total lbs lost since last in-office visit: 7  Interim History: Rebecca Rojas has done well with Category 2 plan. Her hunger was controlled and she did well with weighing and measuring. She was a bit surprised at how much food she had to eat and still lost weight.  Subjective:   1. Elevated triglycerides with high cholesterol Rebecca Rojas's triglycerides are mildly elevated. She would like to improve with diet. She denies chest pain. I discussed labs with the patient today.  2. Vitamin D deficiency Rebecca Rojas is not on Vit D, and she notes fatigue. Her Calcium is slightly low. I discussed labs with the patient today.  3. Insulin resistance Rebecca Rojas has a normal glucose and A1c, but an elevated fasting insulin. She notes polyphagia has improved on her diet prescription and she noted decreased hypoglycemia on Category 2 plan. I discussed labs with the patient today.  4. At risk for diabetes mellitus Rebecca Rojas is at higher than average risk for developing diabetes due to her obesity.   Assessment/Plan:   1. Elevated triglycerides with high cholesterol Rebecca Rojas will continue with diet and exercise. We will recheck labs in 3 months.   2. Vitamin D deficiency Low Vitamin D level contributes to fatigue and are associated with obesity, breast, and colon cancer. Rebecca Rojas agreed to start prescription Vitamin D 50,000 IU every week and will follow-up for routine testing of Vitamin D, at least 2-3 times per year to avoid over-replacement. We will recheck  labs in 3 months.  - Vitamin D, Ergocalciferol, (DRISDOL) 1.25 MG (50000 UNIT) CAPS capsule; Take 1 capsule (50,000 Units total) by mouth every 7 (seven) days.  Dispense: 4 capsule; Refill: 0  3. Insulin resistance Rebecca Rojas will continue to work on weight loss, diet, exercise, and decreasing simple carbohydrates to help decrease the risk of diabetes. We will recheck labs in 3 months. Rebecca Rojas agreed to follow-up with [place] as directed to closely monitor her progress.  4. At risk for diabetes mellitus Rebecca Rojas was given approximately 15 minutes of diabetes education and counseling today. We discussed intensive lifestyle modifications today with an emphasis on weight loss as well as increasing exercise and decreasing simple carbohydrates in her diet. We also reviewed medication options with an emphasis on risk versus benefit of those discussed.   Repetitive spaced learning was employed today to elicit superior memory formation and behavioral change.  5. Class 3 severe obesity with serious comorbidity and body mass index (BMI) of 45.0 to 49.9 in adult, unspecified obesity type Rebecca Rojas) Rebecca Rojas is currently in the action stage of change. As such, her goal is to continue with weight loss efforts. She has agreed to the Category 2 Plan.   Exercise goals: No exercise has been prescribed at this time.  Behavioral modification strategies: increasing lean protein intake and decreasing simple carbohydrates.  Rebecca Rojas has agreed to follow-up with our clinic in 2 weeks. She was informed of the importance of frequent follow-up visits to maximize her success with intensive lifestyle modifications for her multiple health conditions.  Objective:   Blood pressure 134/70, pulse 92, temperature 97.9 F (36.6 C), temperature source Oral, height 5\' 1"  (1.549 m), weight 258 lb (117 kg), SpO2 100 %. Body mass index is 48.75 kg/m.  General: Cooperative, alert, well developed, in no acute distress. HEENT: Conjunctivae and lids  unremarkable. Cardiovascular: Regular rhythm.  Lungs: Normal work of breathing. Neurologic: No focal deficits.   Lab Results  Component Value Date   CREATININE 0.56 (L) 09/01/2019   BUN 11 09/01/2019   NA 142 09/01/2019   K 4.2 09/01/2019   CL 105 09/01/2019   CO2 24 09/01/2019   Lab Results  Component Value Date   ALT 21 09/01/2019   AST 17 09/01/2019   ALKPHOS 92 09/01/2019   BILITOT 0.5 09/01/2019   Lab Results  Component Value Date   HGBA1C 5.5 09/01/2019   Lab Results  Component Value Date   INSULIN 14.0 09/01/2019   Lab Results  Component Value Date   TSH 2.770 09/01/2019   Lab Results  Component Value Date   CHOL 165 09/01/2019   HDL 47 09/01/2019   LDLCALC 92 09/01/2019   TRIG 151 (H) 09/01/2019   Lab Results  Component Value Date   WBC 7.9 09/01/2019   HGB 13.6 09/01/2019   HCT 41.7 09/01/2019   MCV 90 09/01/2019   PLT 309 09/01/2019   No results found for: IRON, TIBC, FERRITIN  Attestation Statements:   Reviewed by clinician on day of visit: allergies, medications, problem list, medical history, surgical history, family history, social history, and previous encounter notes.   I, 10/30/2019, am acting as transcriptionist for Burt Knack, MD.  I have reviewed the above documentation for accuracy and completeness, and I agree with the above. -  Quillian Quince, MD

## 2019-09-23 DIAGNOSIS — M75101 Unspecified rotator cuff tear or rupture of right shoulder, not specified as traumatic: Secondary | ICD-10-CM | POA: Diagnosis not present

## 2019-09-23 DIAGNOSIS — Z4789 Encounter for other orthopedic aftercare: Secondary | ICD-10-CM | POA: Diagnosis not present

## 2019-09-23 DIAGNOSIS — M25611 Stiffness of right shoulder, not elsewhere classified: Secondary | ICD-10-CM | POA: Diagnosis not present

## 2019-09-24 DIAGNOSIS — M25611 Stiffness of right shoulder, not elsewhere classified: Secondary | ICD-10-CM | POA: Diagnosis not present

## 2019-09-24 DIAGNOSIS — Z4789 Encounter for other orthopedic aftercare: Secondary | ICD-10-CM | POA: Diagnosis not present

## 2019-09-24 DIAGNOSIS — J301 Allergic rhinitis due to pollen: Secondary | ICD-10-CM | POA: Diagnosis not present

## 2019-09-24 DIAGNOSIS — J3089 Other allergic rhinitis: Secondary | ICD-10-CM | POA: Diagnosis not present

## 2019-09-24 DIAGNOSIS — M75101 Unspecified rotator cuff tear or rupture of right shoulder, not specified as traumatic: Secondary | ICD-10-CM | POA: Diagnosis not present

## 2019-09-29 DIAGNOSIS — Z4789 Encounter for other orthopedic aftercare: Secondary | ICD-10-CM | POA: Diagnosis not present

## 2019-09-29 DIAGNOSIS — M75101 Unspecified rotator cuff tear or rupture of right shoulder, not specified as traumatic: Secondary | ICD-10-CM | POA: Diagnosis not present

## 2019-09-29 DIAGNOSIS — M25611 Stiffness of right shoulder, not elsewhere classified: Secondary | ICD-10-CM | POA: Diagnosis not present

## 2019-09-30 ENCOUNTER — Other Ambulatory Visit: Payer: Self-pay

## 2019-09-30 ENCOUNTER — Encounter (INDEPENDENT_AMBULATORY_CARE_PROVIDER_SITE_OTHER): Payer: Self-pay | Admitting: Family Medicine

## 2019-09-30 ENCOUNTER — Ambulatory Visit (INDEPENDENT_AMBULATORY_CARE_PROVIDER_SITE_OTHER): Payer: BC Managed Care – PPO | Admitting: Family Medicine

## 2019-09-30 VITALS — BP 130/78 | HR 91 | Temp 97.9°F | Ht 61.0 in | Wt 254.0 lb

## 2019-09-30 DIAGNOSIS — E8881 Metabolic syndrome: Secondary | ICD-10-CM | POA: Diagnosis not present

## 2019-09-30 DIAGNOSIS — Z6841 Body Mass Index (BMI) 40.0 and over, adult: Secondary | ICD-10-CM | POA: Diagnosis not present

## 2019-09-30 NOTE — Progress Notes (Signed)
Chief Complaint:   OBESITY Rebecca Rojas is here to discuss her progress with her obesity treatment plan along with follow-up of her obesity related diagnoses. Rebecca Rojas is on the Category 2 Plan and states she is following her eating plan approximately 70% of the time. Rebecca Rojas states she is exercising 0 minutes 0 times per week.  Today's visit was #: 3 Starting weight: 265 lbs Starting date: 09/01/2019 Today's weight: 254 lbs Today's date: 09/30/2019 Total lbs lost to date: 11 Total lbs lost since last in-office visit: 4  Interim History: Rebecca Rojas travels often to [place] to see her mom and she struggles with what to eat when traveling. She would like options to eat out when traveling. Her hunger is satisfied on the plan.  Subjective:   Insulin resistance Rebecca Rojas has a diagnosis of insulin resistance based on her elevated fasting insulin level >5. Rebecca Rojas is not on metformin. Her fasting insulin was 14.0 (09/01/19). She continues to work on diet and exercise to decrease her risk of diabetes. Rebecca Rojas denies polyphagia.  Lab Results  Component Value Date   INSULIN 14.0 09/01/2019   Lab Results  Component Value Date   HGBA1C 5.5 09/01/2019   Assessment/Plan:   Insulin resistance Rebecca Rojas will continue to work on weight loss, exercise, and decreasing simple carbohydrates to help decrease the risk of diabetes. Rebecca Rojas agreed to follow-up with [place] as directed to closely monitor her progress.  Class 3 severe obesity with serious comorbidity and body mass index (BMI) of 45.0 to 49.9 in adult, unspecified obesity type [facility]) Rebecca Rojas is currently in the action stage of change. As such, her goal is to continue with weight loss efforts. She has agreed to the Category 2 Plan and keeping a food journal and adhering to recommended goals of 400 to 500 calories and 35 grams of protein at supper daily.   Exercise goals: No exercise has been prescribed at this time.  Behavioral modification strategies:  increasing lean protein intake and keeping a strict food journal. MyFitnessPal was demonstrated for patient today.  Handouts: Journaling, eating out, protein content of food, lunch options  Rebecca Rojas has agreed to follow-up with our clinic in 2 weeks. She was informed of the importance of frequent follow-up visits to maximize her success with intensive lifestyle modifications for her multiple health conditions.   Objective:   Blood pressure 130/78, pulse 91, temperature 97.9 F (36.6 C), temperature source Oral, height 5\' 1"  (1.549 m), weight 254 lb (115.2 kg), SpO2 94 %. Body mass index is 47.99 kg/m.  General: Cooperative, alert, well developed, in no acute distress. HEENT: Conjunctivae and lids unremarkable. Cardiovascular: Regular rhythm.  Lungs: Normal work of breathing. Neurologic: No focal deficits.   Lab Results  Component Value Date   CREATININE 0.56 (L) 09/01/2019   BUN 11 09/01/2019   NA 142 09/01/2019   K 4.2 09/01/2019   CL 105 09/01/2019   CO2 24 09/01/2019   Lab Results  Component Value Date   ALT 21 09/01/2019   AST 17 09/01/2019   ALKPHOS 92 09/01/2019   BILITOT 0.5 09/01/2019   Lab Results  Component Value Date   HGBA1C 5.5 09/01/2019   Lab Results  Component Value Date   INSULIN 14.0 09/01/2019   Lab Results  Component Value Date   TSH 2.770 09/01/2019   Lab Results  Component Value Date   CHOL 165 09/01/2019   HDL 47 09/01/2019   LDLCALC 92 09/01/2019   TRIG 151 (H) 09/01/2019  Lab Results  Component Value Date   WBC 7.9 09/01/2019   HGB 13.6 09/01/2019   HCT 41.7 09/01/2019   MCV 90 09/01/2019   PLT 309 09/01/2019   No results found for: IRON, TIBC, FERRITIN   Ref. Range 09/01/2019 12:26  Vitamin D, 25-Hydroxy Latest Ref Range: 30.0 - 100.0 ng/mL 17.8 (L)    Attestation Statements:   Reviewed by clinician on day of visit: allergies, medications, problem list, medical history, surgical history, family history, social history, and  previous encounter notes.  Corey Skains, am acting as Location manager for [employer], FNP-C.  I have reviewed the above documentation for accuracy and completeness, and I agree with the above. -  Neri Vieyra [employer], FNP-C

## 2019-10-01 ENCOUNTER — Encounter (INDEPENDENT_AMBULATORY_CARE_PROVIDER_SITE_OTHER): Payer: Self-pay | Admitting: Family Medicine

## 2019-10-01 DIAGNOSIS — Z4789 Encounter for other orthopedic aftercare: Secondary | ICD-10-CM | POA: Diagnosis not present

## 2019-10-01 DIAGNOSIS — E88819 Insulin resistance, unspecified: Secondary | ICD-10-CM | POA: Insufficient documentation

## 2019-10-01 DIAGNOSIS — E8881 Metabolic syndrome: Secondary | ICD-10-CM | POA: Insufficient documentation

## 2019-10-01 DIAGNOSIS — R7301 Impaired fasting glucose: Secondary | ICD-10-CM | POA: Insufficient documentation

## 2019-10-01 DIAGNOSIS — M75101 Unspecified rotator cuff tear or rupture of right shoulder, not specified as traumatic: Secondary | ICD-10-CM | POA: Diagnosis not present

## 2019-10-01 DIAGNOSIS — J3089 Other allergic rhinitis: Secondary | ICD-10-CM | POA: Diagnosis not present

## 2019-10-01 DIAGNOSIS — M25611 Stiffness of right shoulder, not elsewhere classified: Secondary | ICD-10-CM | POA: Diagnosis not present

## 2019-10-01 HISTORY — DX: Metabolic syndrome: E88.81

## 2019-10-01 HISTORY — DX: Insulin resistance, unspecified: E88.819

## 2019-10-06 DIAGNOSIS — G4733 Obstructive sleep apnea (adult) (pediatric): Secondary | ICD-10-CM | POA: Diagnosis not present

## 2019-10-06 DIAGNOSIS — M25611 Stiffness of right shoulder, not elsewhere classified: Secondary | ICD-10-CM | POA: Diagnosis not present

## 2019-10-06 DIAGNOSIS — Z4789 Encounter for other orthopedic aftercare: Secondary | ICD-10-CM | POA: Diagnosis not present

## 2019-10-06 DIAGNOSIS — M7501 Adhesive capsulitis of right shoulder: Secondary | ICD-10-CM | POA: Diagnosis not present

## 2019-10-08 ENCOUNTER — Other Ambulatory Visit (INDEPENDENT_AMBULATORY_CARE_PROVIDER_SITE_OTHER): Payer: Self-pay | Admitting: Family Medicine

## 2019-10-08 DIAGNOSIS — E559 Vitamin D deficiency, unspecified: Secondary | ICD-10-CM

## 2019-10-12 DIAGNOSIS — M67911 Unspecified disorder of synovium and tendon, right shoulder: Secondary | ICD-10-CM | POA: Diagnosis not present

## 2019-10-14 ENCOUNTER — Other Ambulatory Visit: Payer: Self-pay

## 2019-10-14 ENCOUNTER — Ambulatory Visit (INDEPENDENT_AMBULATORY_CARE_PROVIDER_SITE_OTHER): Payer: BC Managed Care – PPO | Admitting: Family Medicine

## 2019-10-14 ENCOUNTER — Encounter (INDEPENDENT_AMBULATORY_CARE_PROVIDER_SITE_OTHER): Payer: Self-pay | Admitting: Family Medicine

## 2019-10-14 VITALS — BP 130/77 | HR 92 | Temp 97.7°F | Ht 61.0 in | Wt 255.0 lb

## 2019-10-14 DIAGNOSIS — Z4789 Encounter for other orthopedic aftercare: Secondary | ICD-10-CM | POA: Diagnosis not present

## 2019-10-14 DIAGNOSIS — Z6841 Body Mass Index (BMI) 40.0 and over, adult: Secondary | ICD-10-CM | POA: Diagnosis not present

## 2019-10-14 DIAGNOSIS — E8881 Metabolic syndrome: Secondary | ICD-10-CM | POA: Diagnosis not present

## 2019-10-14 DIAGNOSIS — M75101 Unspecified rotator cuff tear or rupture of right shoulder, not specified as traumatic: Secondary | ICD-10-CM | POA: Diagnosis not present

## 2019-10-14 DIAGNOSIS — M25611 Stiffness of right shoulder, not elsewhere classified: Secondary | ICD-10-CM | POA: Diagnosis not present

## 2019-10-14 DIAGNOSIS — E559 Vitamin D deficiency, unspecified: Secondary | ICD-10-CM | POA: Diagnosis not present

## 2019-10-14 MED ORDER — VITAMIN D (ERGOCALCIFEROL) 1.25 MG (50000 UNIT) PO CAPS: 50000.0000 [IU] | ORAL_CAPSULE | ORAL | 0 refills | Status: DC

## 2019-10-15 ENCOUNTER — Encounter (INDEPENDENT_AMBULATORY_CARE_PROVIDER_SITE_OTHER): Payer: Self-pay | Admitting: Family Medicine

## 2019-10-15 DIAGNOSIS — E559 Vitamin D deficiency, unspecified: Secondary | ICD-10-CM | POA: Insufficient documentation

## 2019-10-15 NOTE — Progress Notes (Signed)
Chief Complaint:   OBESITY Rebecca Rojas is here to discuss her progress with her obesity treatment plan along with follow-up of her obesity related diagnoses. Rebecca Rojas is on the Category 2 Plan and states she is following her eating plan approximately 50% of the time. Rebecca Rojas states she is riding the stationary bike 30 minutes 3 times per week.  Today's visit was #: 4 Starting weight: 265 lbs Starting date: 09/01/2019 Today's weight: 255 lbs Today's date: 10/14/2019 Total lbs lost to date: 10 Total lbs lost since last in-office visit: 0  Interim History: Rebecca Rojas reports being off the plan over the last few weeks, due to traveling to see her mother. She reports she may not be able to continue to come to our clinic because she may be losing her insurance coverage.  Subjective:   Vitamin D deficiency Rebecca Rojas's last vitamin D level was 17.8 on 09/01/2019 and was not at goal. She is on weekly prescription vitamin D.   Insulin resistance Rebecca Rojas has a diagnosis of insulin resistance based on her elevated fasting insulin level >5. She continues to work on diet and exercise to decrease her risk of diabetes. Rebecca Rojas is not on metformin. She denies polyphagia.  Lab Results  Component Value Date   INSULIN 14.0 09/01/2019   Lab Results  Component Value Date   HGBA1C 5.5 09/01/2019   Assessment/Plan:   Vitamin D deficiency  Low Vitamin D level contributes to fatigue and are associated with obesity, breast, and colon cancer. Rebecca Rojas agrees to continue to take prescription Vitamin D @50 ,000 IU every week #12 with no refills. She will have her PCP take over management of vitamin D deficiency if she cannot return to our clinic.  Insulin resistance Rebecca Rojas will continue with the meal plan and she will continue to work on weight loss, exercise, and decreasing simple carbohydrates to help decrease the risk of diabetes. Rebecca Rojas agreed to follow-up with Rebecca Rojas as directed to closely monitor her  progress.  Class 3 severe obesity with serious comorbidity and body mass index (BMI) of 45.0 to 49.9 in adult, unspecified obesity type [facility]) Rebecca Rojas is currently in the action stage of change. As such, her goal is to continue with weight loss efforts. She has agreed to the Category 3 Plan or keeping a food journal and adhering to recommended goals of 1500 to 1600 calories and 90 grams of protein daily.   Exercise goals: Rebecca Rojas will continue her current exercise regimen.  Behavioral modification strategies: increasing lean protein intake and planning for success.  Handouts: Journaling  Rebecca Rojas has agreed to follow-up with our clinic if she is able. She was informed of the importance of frequent follow-up visits to maximize her success with intensive lifestyle modifications for her multiple health conditions.   Objective:   Blood pressure 130/77, pulse 92, temperature 97.7 F (36.5 C), temperature source Oral, height 5\' 1"  (1.549 m), weight 255 lb (115.7 kg), SpO2 99 %. Body mass index is 48.18 kg/m.  General: Cooperative, alert, well developed, in no acute distress. HEENT: Conjunctivae and lids unremarkable. Cardiovascular: Regular rhythm.  Lungs: Normal work of breathing. Neurologic: No focal deficits.   Lab Results  Component Value Date   CREATININE 0.56 (L) 09/01/2019   BUN 11 09/01/2019   NA 142 09/01/2019   K 4.2 09/01/2019   CL 105 09/01/2019   CO2 24 09/01/2019   Lab Results  Component Value Date   ALT 21 09/01/2019   AST 17 09/01/2019   ALKPHOS  92 09/01/2019   BILITOT 0.5 09/01/2019   Lab Results  Component Value Date   HGBA1C 5.5 09/01/2019   Lab Results  Component Value Date   INSULIN 14.0 09/01/2019   Lab Results  Component Value Date   TSH 2.770 09/01/2019   Lab Results  Component Value Date   CHOL 165 09/01/2019   HDL 47 09/01/2019   LDLCALC 92 09/01/2019   TRIG 151 (H) 09/01/2019   Lab Results  Component Value Date   WBC 7.9 09/01/2019   HGB  13.6 09/01/2019   HCT 41.7 09/01/2019   MCV 90 09/01/2019   PLT 309 09/01/2019   No results found for: IRON, TIBC, FERRITIN   Ref. Range 09/01/2019 12:26  Vitamin D, 25-Hydroxy Latest Ref Range: 30.0 - 100.0 ng/mL 17.8 (L)    Attestation Statements:   Reviewed by clinician on day of visit: allergies, medications, problem list, medical history, surgical history, family history, social history, and previous encounter notes.  Corey Skains, am acting as Location manager for [employer], FNP-C.  I have reviewed the above documentation for accuracy and completeness, and I agree with the above. -  Jamesina Gaugh [employer], FNP-C

## 2019-10-21 DIAGNOSIS — J3089 Other allergic rhinitis: Secondary | ICD-10-CM | POA: Diagnosis not present

## 2019-10-21 DIAGNOSIS — M75101 Unspecified rotator cuff tear or rupture of right shoulder, not specified as traumatic: Secondary | ICD-10-CM | POA: Diagnosis not present

## 2019-10-21 DIAGNOSIS — M25611 Stiffness of right shoulder, not elsewhere classified: Secondary | ICD-10-CM | POA: Diagnosis not present

## 2019-10-21 DIAGNOSIS — Z4789 Encounter for other orthopedic aftercare: Secondary | ICD-10-CM | POA: Diagnosis not present

## 2019-10-21 DIAGNOSIS — J301 Allergic rhinitis due to pollen: Secondary | ICD-10-CM | POA: Diagnosis not present

## 2019-11-02 DIAGNOSIS — M67911 Unspecified disorder of synovium and tendon, right shoulder: Secondary | ICD-10-CM | POA: Diagnosis not present

## 2019-11-02 DIAGNOSIS — J301 Allergic rhinitis due to pollen: Secondary | ICD-10-CM | POA: Diagnosis not present

## 2019-11-02 DIAGNOSIS — M25511 Pain in right shoulder: Secondary | ICD-10-CM | POA: Diagnosis not present

## 2019-11-02 DIAGNOSIS — J3089 Other allergic rhinitis: Secondary | ICD-10-CM | POA: Diagnosis not present

## 2019-11-13 DIAGNOSIS — J3081 Allergic rhinitis due to animal (cat) (dog) hair and dander: Secondary | ICD-10-CM | POA: Diagnosis not present

## 2019-11-13 DIAGNOSIS — J3089 Other allergic rhinitis: Secondary | ICD-10-CM | POA: Diagnosis not present

## 2019-11-13 DIAGNOSIS — J301 Allergic rhinitis due to pollen: Secondary | ICD-10-CM | POA: Diagnosis not present

## 2019-11-26 DIAGNOSIS — J3089 Other allergic rhinitis: Secondary | ICD-10-CM | POA: Diagnosis not present

## 2019-11-26 DIAGNOSIS — J301 Allergic rhinitis due to pollen: Secondary | ICD-10-CM | POA: Diagnosis not present

## 2019-12-02 DIAGNOSIS — J3089 Other allergic rhinitis: Secondary | ICD-10-CM | POA: Diagnosis not present

## 2019-12-07 DIAGNOSIS — J301 Allergic rhinitis due to pollen: Secondary | ICD-10-CM | POA: Diagnosis not present

## 2019-12-07 DIAGNOSIS — J3089 Other allergic rhinitis: Secondary | ICD-10-CM | POA: Diagnosis not present

## 2019-12-15 DIAGNOSIS — G4733 Obstructive sleep apnea (adult) (pediatric): Secondary | ICD-10-CM | POA: Diagnosis not present

## 2019-12-16 DIAGNOSIS — J3089 Other allergic rhinitis: Secondary | ICD-10-CM | POA: Diagnosis not present

## 2019-12-21 DIAGNOSIS — J301 Allergic rhinitis due to pollen: Secondary | ICD-10-CM | POA: Diagnosis not present

## 2019-12-21 DIAGNOSIS — J3089 Other allergic rhinitis: Secondary | ICD-10-CM | POA: Diagnosis not present

## 2019-12-28 DIAGNOSIS — J3089 Other allergic rhinitis: Secondary | ICD-10-CM | POA: Diagnosis not present

## 2019-12-28 DIAGNOSIS — J301 Allergic rhinitis due to pollen: Secondary | ICD-10-CM | POA: Diagnosis not present

## 2019-12-28 DIAGNOSIS — J4530 Mild persistent asthma, uncomplicated: Secondary | ICD-10-CM | POA: Diagnosis not present

## 2019-12-28 DIAGNOSIS — H1045 Other chronic allergic conjunctivitis: Secondary | ICD-10-CM | POA: Diagnosis not present

## 2019-12-30 DIAGNOSIS — J3089 Other allergic rhinitis: Secondary | ICD-10-CM | POA: Diagnosis not present

## 2019-12-31 DIAGNOSIS — Z09 Encounter for follow-up examination after completed treatment for conditions other than malignant neoplasm: Secondary | ICD-10-CM | POA: Diagnosis not present

## 2020-01-05 DIAGNOSIS — G4733 Obstructive sleep apnea (adult) (pediatric): Secondary | ICD-10-CM | POA: Diagnosis not present

## 2020-01-06 DIAGNOSIS — J3089 Other allergic rhinitis: Secondary | ICD-10-CM | POA: Diagnosis not present

## 2020-01-06 DIAGNOSIS — J301 Allergic rhinitis due to pollen: Secondary | ICD-10-CM | POA: Diagnosis not present

## 2020-01-13 ENCOUNTER — Encounter: Payer: Self-pay | Admitting: Physician Assistant

## 2020-01-13 ENCOUNTER — Ambulatory Visit (INDEPENDENT_AMBULATORY_CARE_PROVIDER_SITE_OTHER): Payer: BC Managed Care – PPO | Admitting: Physician Assistant

## 2020-01-13 ENCOUNTER — Other Ambulatory Visit: Payer: Self-pay

## 2020-01-13 VITALS — BP 128/72 | HR 72 | Temp 97.7°F | Ht 61.0 in | Wt 253.0 lb

## 2020-01-13 DIAGNOSIS — J060 Acute laryngopharyngitis: Secondary | ICD-10-CM | POA: Diagnosis not present

## 2020-01-13 HISTORY — DX: Acute laryngopharyngitis: J06.0

## 2020-01-13 LAB — POC COVID19 BINAXNOW: SARS Coronavirus 2 Ag: NEGATIVE

## 2020-01-13 MED ORDER — TRIAMCINOLONE ACETONIDE 40 MG/ML IJ SUSP: 60.0000 mg | Freq: Once | INTRAMUSCULAR | Status: DC

## 2020-01-13 MED ORDER — AZITHROMYCIN 250 MG PO TABS: ORAL_TABLET | ORAL | 0 refills | Status: DC

## 2020-01-13 NOTE — Assessment & Plan Note (Signed)
rx for zpack as directed Continue decongestants as directed Kenalog 60mg  IM given

## 2020-01-13 NOTE — Progress Notes (Signed)
Acute Office Visit  Subjective:    Patient ID: Rebecca Rojas, adult    DOB: 1955/10/13, 64 y.o.   MRN: [id number]  Chief Complaint  Patient presents with  . URI    HPI Patient is in today for URI/sinusitis  Pt states that over the past few days she has had sinus pressure/PND, congestion She has trouble with allergies all the time and currently on albuterol, breo, xyzal, nasonex and singulair She has also been taking mucinex D She states that she has not had a cough She does say however that she had a fever of 102 on Monday and 100 yesterday -- says she has had achiness all over as well  Past Medical History:  Diagnosis Date  . Allergy-induced asthma   . Anemia   . Arthritis    osteoarthritis  . Back pain   . Headache(784.0)    occasional migraine  . History of seasonal allergies   . Joint pain   . Lower extremity edema   . Morbid obesity ([place])   . Osteoarthritis   . Shortness of breath    maybe with exertion  . Sleep apnea   . Vertigo   . Vitamin D deficiency     Past Surgical History:  Procedure Laterality Date  . ABDOMINAL HYSTERECTOMY    . FRACTURE SURGERY     left 5th finger fx with bone graft repair  . HEEL SPUR EXCISION    . JOINT REPLACEMENT    . KNEE ARTHROSCOPY Left   . Left Knee lateral release    . LIPOMA EXCISION    . SHOULDER ARTHROSCOPY WITH ROTATOR CUFF REPAIR AND SUBACROMIAL DECOMPRESSION Right 06/15/2019   Procedure: SHOULDER ARTHROSCOPY WITH ROTATOR CUFF REPAIR AND SUBACROMIAL DECOMPRESSION;  Surgeon: Dorna Leitz, MD;  Location: [facility];  Service: Orthopedics;  Laterality: Right;  . TONSILLECTOMY    . TOTAL KNEE ARTHROPLASTY  08/03/2011   Procedure: TOTAL KNEE ARTHROPLASTY;  Surgeon: Alta Corning;  Location: [place];  Service: Orthopedics;  Laterality: Right;  COMPUTER ASSISTED TOTAL KNEE REPLACEMENTwith revision tibial component  . TUBAL LIGATION      Family History  Problem Relation Age of Onset  . Hypertension Mother   . Heart  disease Mother   . Heart attack Mother   . Hyperlipidemia Mother   . Stroke Mother   . Obesity Mother   . Cancer Father   . Liver disease Father   . Obesity Father   . Anesthesia problems Neg Hx   . Hypotension Neg Hx   . Malignant hyperthermia Neg Hx   . Pseudochol deficiency Neg Hx     Social History   Socioeconomic History  . Marital status: Married    Spouse name: Varshini Arrants  . Number of children: 2  . Years of education: Not on file  . Highest education level: Not on file  Occupational History  . 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 kits  Tobacco Use  . Smoking status: Never Smoker  . Smokeless tobacco: Never Used  Substance and Sexual Activity  . Alcohol use: No  . Drug use: No  . Sexual activity: Yes  Other Topics Concern  . Not on file  Social History Narrative  . Not on file   Social Determinants of Health   Financial Resource Strain:   . Difficulty of Paying Living Expenses:   Food Insecurity:   . Worried About Charity fundraiser in the Last Year:   . Arboriculturist in  the Last Year:   Transportation Needs:   . Film/video editor (Medical):   Marland Kitchen Lack of Transportation (Non-Medical):   Physical Activity:   . Days of Exercise per Week:   . Minutes of Exercise per Session:   Stress:   . Feeling of Stress :   Social Connections:   . Frequency of Communication with Friends and Family:   . Frequency of Social Gatherings with Friends and Family:   . Attends Religious Services:   . Active Member of Clubs or Organizations:   . Attends Archivist Meetings:   Marland Kitchen Marital Status:   Intimate Partner Violence:   . Fear of Current or Ex-Partner:   . Emotionally Abused:   Marland Kitchen Physically Abused:   . Sexually Abused:      Current Outpatient Medications:  .  albuterol (PROVENTIL HFA;VENTOLIN HFA) 108 (90 BASE) MCG/ACT inhaler, Inhale 1-2 puffs into the lungs every 6 (six) hours as needed for wheezing or shortness of breath. , Disp: , Rfl:  .   albuterol (PROVENTIL) (2.5 MG/3ML) 0.083% nebulizer solution, Inhale 2.5 mg into the lungs every 4 (four) hours as needed for wheezing or shortness of breath., Disp: , Rfl:  .  azelastine (OPTIVAR) 0.05 % ophthalmic solution, Place 1 drop into both eyes 2 (two) times daily as needed (allergy eyes.)., Disp: , Rfl:  .  cyclobenzaprine (FLEXERIL) 10 MG tablet, Take 10 mg by mouth 3 (three) times daily as needed for muscle spasms., Disp: , Rfl:  .  EPINEPHrine (EPIPEN 2-PAK) 0.3 mg/0.3 mL IJ SOAJ injection, Inject 0.3 mg into the muscle as needed for anaphylaxis., Disp: , Rfl:  .  fluticasone furoate-vilanterol (BREO ELLIPTA) 200-25 MCG/INH AEPB, Inhale 1 puff into the lungs daily. , Disp: , Rfl:  .  furosemide (LASIX) 20 MG tablet, Take 40 mg by mouth daily as needed (fluid retention.). , Disp: , Rfl:  .  levocetirizine (XYZAL) 5 MG tablet, Take 5 mg by mouth every evening.  , Disp: , Rfl:  .  mometasone (NASONEX) 50 MCG/ACT nasal spray, Place 2 sprays into the nose daily., Disp: 17 g, Rfl: 12 .  montelukast (SINGULAIR) 10 MG tablet, Take 10 mg by mouth at bedtime.  , Disp: , Rfl:  .  PRESCRIPTION MEDICATION, Inject 1 kit into the skin every 7 (seven) days. Allergy shot unknown to pt , Disp: , Rfl:    Allergies  Allergen Reactions  . Cefdinir Itching  . Hydrocodone-Homatropine Rash    CONSTITUTIONAL: see HPI  E/N/T: see HPI  CARDIOVASCULAR: Negative for chest pain, dizziness, palpitations and pedal edema.  RESPIRATORY: Negative for recent cough and dyspnea.  GASTROINTESTINAL: Negative for abdominal pain, acid reflux symptoms, constipation, diarrhea, nausea and vomiting.   NEUROLOGICAL: Negative for dizziness and headaches.  PSYCHIATRIC: Negative for sleep disturbance and to question depression screen.  Negative for depression, negative for anhedonia.         Objective:    PHYSICAL EXAM:   VS: BP 128/72 (BP Location: Left Arm, Patient Position: Sitting)   Pulse 72   Temp 97.7 F (36.5  C) (Temporal)   Ht 5' 1"  (1.549 m)   Wt 253 lb (114.8 kg)   SpO2 100%   BMI 47.80 kg/m   GEN: Well nourished, well developed, in no acute distress  HEENT: normal external ears and nose - normal external auditory canals and TMS -- Lips, Teeth and Gums - normal  Oropharynx - erythema and PND noted  Cardiac: RRR; no murmurs, rubs,  or gallops,no edema -  Respiratory:  normal respiratory rate and pattern with no distress - normal breath sounds with no rales, rhonchi, wheezes or rubs  Skin: warm and dry, no rash   Psych: euthymic mood, appropriate affect and demeanor   Wt Readings from Last 3 Encounters:  01/13/20 253 lb (114.8 kg)  10/14/19 255 lb (115.7 kg)  09/30/19 254 lb (115.2 kg)    Health Maintenance Due  Topic Date Due  . Hepatitis C Screening  Never done  . HIV Screening  Never done  . PAP SMEAR-Modifier  Never done  . MAMMOGRAM  Never done  . COLONOSCOPY  Never done    There are no preventive care reminders to display for this patient.   Office Visit on 01/13/2020  Component Date Value Ref Range Status  . SARS Coronavirus 2 Ag 01/13/2020 Negative  Negative Final        Assessment & Plan:   Problem List Items Addressed This Visit      Respiratory   Acute laryngopharyngitis - Primary    rx for zpack as directed Continue decongestants as directed Kenalog 77m IM given          No orders of the defined types were placed in this encounter.    SARA R Solana Coggin, PA-C

## 2020-01-14 DIAGNOSIS — J301 Allergic rhinitis due to pollen: Secondary | ICD-10-CM | POA: Diagnosis not present

## 2020-01-14 DIAGNOSIS — J3089 Other allergic rhinitis: Secondary | ICD-10-CM | POA: Diagnosis not present

## 2020-01-26 DIAGNOSIS — J301 Allergic rhinitis due to pollen: Secondary | ICD-10-CM | POA: Diagnosis not present

## 2020-02-02 DIAGNOSIS — J301 Allergic rhinitis due to pollen: Secondary | ICD-10-CM | POA: Diagnosis not present

## 2020-02-02 DIAGNOSIS — J3089 Other allergic rhinitis: Secondary | ICD-10-CM | POA: Diagnosis not present

## 2020-02-04 DIAGNOSIS — J301 Allergic rhinitis due to pollen: Secondary | ICD-10-CM | POA: Diagnosis not present

## 2020-02-04 DIAGNOSIS — J3089 Other allergic rhinitis: Secondary | ICD-10-CM | POA: Diagnosis not present

## 2020-02-08 DIAGNOSIS — J301 Allergic rhinitis due to pollen: Secondary | ICD-10-CM | POA: Diagnosis not present

## 2020-02-08 DIAGNOSIS — J3089 Other allergic rhinitis: Secondary | ICD-10-CM | POA: Diagnosis not present

## 2020-02-09 DIAGNOSIS — E559 Vitamin D deficiency, unspecified: Secondary | ICD-10-CM | POA: Diagnosis not present

## 2020-02-09 DIAGNOSIS — E782 Mixed hyperlipidemia: Secondary | ICD-10-CM | POA: Diagnosis not present

## 2020-02-09 DIAGNOSIS — Z1329 Encounter for screening for other suspected endocrine disorder: Secondary | ICD-10-CM | POA: Diagnosis not present

## 2020-02-09 DIAGNOSIS — I10 Essential (primary) hypertension: Secondary | ICD-10-CM | POA: Diagnosis not present

## 2020-02-09 DIAGNOSIS — Z13228 Encounter for screening for other metabolic disorders: Secondary | ICD-10-CM | POA: Diagnosis not present

## 2020-02-09 DIAGNOSIS — Z130 Encounter for screening for diseases of the blood and blood-forming organs and certain disorders involving the immune mechanism: Secondary | ICD-10-CM | POA: Diagnosis not present

## 2020-02-11 DIAGNOSIS — J301 Allergic rhinitis due to pollen: Secondary | ICD-10-CM | POA: Diagnosis not present

## 2020-02-11 DIAGNOSIS — Z1211 Encounter for screening for malignant neoplasm of colon: Secondary | ICD-10-CM | POA: Diagnosis not present

## 2020-02-11 DIAGNOSIS — J3089 Other allergic rhinitis: Secondary | ICD-10-CM | POA: Diagnosis not present

## 2020-02-11 DIAGNOSIS — Z0000 Encounter for general adult medical examination without abnormal findings: Secondary | ICD-10-CM | POA: Diagnosis not present

## 2020-02-15 ENCOUNTER — Encounter: Payer: Self-pay | Admitting: Gastroenterology

## 2020-02-16 DIAGNOSIS — J301 Allergic rhinitis due to pollen: Secondary | ICD-10-CM | POA: Diagnosis not present

## 2020-02-16 DIAGNOSIS — J3089 Other allergic rhinitis: Secondary | ICD-10-CM | POA: Diagnosis not present

## 2020-04-04 ENCOUNTER — Other Ambulatory Visit: Payer: Self-pay

## 2020-04-04 ENCOUNTER — Ambulatory Visit (AMBULATORY_SURGERY_CENTER): Payer: Self-pay

## 2020-04-04 VITALS — Ht 61.0 in | Wt 263.0 lb

## 2020-04-04 DIAGNOSIS — Z1211 Encounter for screening for malignant neoplasm of colon: Secondary | ICD-10-CM

## 2020-04-04 MED ORDER — PLENVU 140 G PO SOLR: 1.0000 | Freq: Once | ORAL | 0 refills | Status: AC

## 2020-04-04 NOTE — Progress Notes (Signed)
No egg or soy allergy known to patient  No issues with past sedation with any surgeries or procedures no intubation problems in the past  No FH of Malignant Hyperthermia No diet pills per patient No home 02 use per patient  No blood thinners per patient  Pt denies issues with constipation  No A fib or A flutter  EMMI video to pt or via MyChart  COVID 19 guidelines implemented in PV today with Pt and RN   Plenvu Coupon given to pt in PV today   Pt has been vaccinated for covid.  Pt instructed to bring albuterol inhaler with her the day of the procedure.   Due to the COVID-19 pandemic we are asking patients to follow these guidelines. Please only bring one care partner. Please be aware that your care partner may wait in the car in the parking lot or if they feel like they will be too hot to wait in the car, they may wait in the lobby on the 4th floor. All care partners are required to wear a mask the entire time (we do not have any that we can provide them), they need to practice social distancing, and we will do a Covid check for all patient's and care partners when you arrive. Also we will check their temperature and your temperature. If the care partner waits in their car they need to stay in the parking lot the entire time and we will call them on their cell phone when the patient is ready for discharge so they can bring the car to the front of the building. Also all patient's will need to wear a mask into building.

## 2020-04-13 ENCOUNTER — Encounter: Payer: Self-pay | Admitting: Certified Registered Nurse Anesthetist

## 2020-04-14 ENCOUNTER — Ambulatory Visit (AMBULATORY_SURGERY_CENTER): Payer: BC Managed Care – PPO | Admitting: Gastroenterology

## 2020-04-14 ENCOUNTER — Other Ambulatory Visit: Payer: Self-pay

## 2020-04-14 ENCOUNTER — Encounter: Payer: Self-pay | Admitting: Gastroenterology

## 2020-04-14 VITALS — BP 124/75 | HR 79 | Temp 97.5°F | Resp 22 | Ht 61.0 in | Wt 263.0 lb

## 2020-04-14 DIAGNOSIS — Z1211 Encounter for screening for malignant neoplasm of colon: Secondary | ICD-10-CM | POA: Diagnosis not present

## 2020-04-14 MED ORDER — SODIUM CHLORIDE 0.9 % IV SOLN: 500.0000 mL | Freq: Once | INTRAVENOUS | Status: DC

## 2020-04-14 NOTE — Patient Instructions (Signed)
Resume previous medications.  Await pathology for final recommendations.  Handouts on findings given to patient.    YOU HAD AN ENDOSCOPIC PROCEDURE TODAY AT THE [facility]:   Refer to the procedure report that was given to you for any specific questions about what was found during the examination.  If the procedure report does not answer your questions, please call your gastroenterologist to clarify.  If you requested that your care partner not be given the details of your procedure findings, then the procedure report has been included in a sealed envelope for you to review at your convenience later.  YOU SHOULD EXPECT: Some feelings of bloating in the abdomen. Passage of more gas than usual.  Walking can help get rid of the air that was put into your GI tract during the procedure and reduce the bloating. If you had a lower endoscopy (such as a colonoscopy or flexible sigmoidoscopy) you may notice spotting of blood in your stool or on the toilet paper. If you underwent a bowel prep for your procedure, you may not have a normal bowel movement for a few days.  Please Note:  You might notice some irritation and congestion in your nose or some drainage.  This is from the oxygen used during your procedure.  There is no need for concern and it should clear up in a day or so.  SYMPTOMS TO REPORT IMMEDIATELY:   Following lower endoscopy (colonoscopy or flexible sigmoidoscopy):  Excessive amounts of blood in the stool  Significant tenderness or worsening of abdominal pains  Swelling of the abdomen that is new, acute  Fever of 100F or higher   For urgent or emergent issues, a gastroenterologist can be reached at any hour by calling [phone]. Do not use MyChart messaging for urgent concerns.    DIET:  We do recommend a small meal at first, but then you may proceed to your regular diet.  Drink plenty of fluids but you should avoid alcoholic beverages for 24 hours.  ACTIVITY:  You  should plan to take it easy for the rest of today and you should NOT DRIVE or use heavy machinery until tomorrow (because of the sedation medicines used during the test).    FOLLOW UP: Our staff will call the number listed on your records 48-72 hours following your procedure to check on you and address any questions or concerns that you may have regarding the information given to you following your procedure. If we do not reach you, we will leave a message.  We will attempt to reach you two times.  During this call, we will ask if you have developed any symptoms of COVID 19. If you develop any symptoms (ie: fever, flu-like symptoms, shortness of breath, cough etc.) before then, please call [phone].  If you test positive for Covid 19 in the 2 weeks post procedure, please call and report this information to us.    If any biopsies were taken you will be contacted by phone or by letter within the next 1-3 weeks.  Please call us at [phone] if you have not heard about the biopsies in 3 weeks.    SIGNATURES/CONFIDENTIALITY: You and/or your care partner have signed paperwork which will be entered into your electronic medical record.  These signatures attest to the fact that that the information above on your After Visit Summary has been reviewed and is understood.  Full responsibility of the confidentiality of this discharge information lies with you and/or your   care-partner. 

## 2020-04-14 NOTE — Progress Notes (Signed)
Report given to PACU, vss 

## 2020-04-14 NOTE — Progress Notes (Signed)
VS-CW  Pt's states no medical or surgical changes since previsit or office visit.  

## 2020-04-14 NOTE — Op Note (Signed)
[facility] Patient Name: Rebecca Rojas Procedure Date: 04/14/2020 9:44 AM MRN: [id number] Endoscopist: Lynann Bologna , MD Age: 64 Referring MD:  Date of Birth: 05-23-1956 Gender: Female Account #: [id number] Procedure:                Colonoscopy Indications:              Screening for colorectal malignant neoplasm Medicines:                Monitored Anesthesia Care Procedure:                Pre-Anesthesia Assessment:                           - Prior to the procedure, a History and Physical                            was performed, and patient medications and                            allergies were reviewed. The patient's tolerance of                            previous anesthesia was also reviewed. The risks                            and benefits of the procedure and the sedation                            options and risks were discussed with the patient.                            All questions were answered, and informed consent                            was obtained. Prior Anticoagulants: The patient has                            taken no previous anticoagulant or antiplatelet                            agents. ASA Grade Assessment: II - A patient with                            mild systemic disease. After reviewing the risks                            and benefits, the patient was deemed in                            satisfactory condition to undergo the procedure.                           After obtaining informed consent, the colonoscope  was passed under direct vision. Throughout the                            procedure, the patient's blood pressure, pulse, and                            oxygen saturations were monitored continuously. The                            Colonoscope was introduced through the anus and                            advanced to the the cecum, identified by                            appendiceal orifice and  ileocecal valve. The                            colonoscopy was performed without difficulty. The                            patient tolerated the procedure well. The quality                            of the bowel preparation was good. The ileocecal                            valve, appendiceal orifice, and rectum were                            photographed. Scope In: 9:56:29 AM Scope Out: 10:08:09 AM Scope Withdrawal Time: 0 hours 9 minutes 24 seconds  Total Procedure Duration: 0 hours 11 minutes 40 seconds  Findings:                 A few small-mouthed diverticula were found in the                            sigmoid colon.                           Non-bleeding internal hemorrhoids were found during                            retroflexion. The hemorrhoids were small.                           The exam was otherwise without abnormality on                            direct and retroflexion views. Complications:            No immediate complications. Estimated Blood Loss:     Estimated blood loss: none. Impression:               -Mild sigmoid diverticulosis.                           -  Non-bleeding internal hemorrhoids.                           -The examination was otherwise normal on direct and                            retroflexion views.                           -No specimens collected. Recommendation:           - Patient has a contact number available for                            emergencies. The signs and symptoms of potential                            delayed complications were discussed with the                            patient. Return to normal activities tomorrow.                            Written discharge instructions were provided to the                            patient.                           - High fiber diet.                           - Continue present medications.                           - Repeat colonoscopy in 10 years for screening                             purposes. Earlier, if with any new problems or if                            there is any change in family history.                           - Return to GI office PRN.                           - D/W Theron Arista. Lynann Bologna, MD 04/14/2020 10:11:53 AM This report has been signed electronically.

## 2020-04-18 ENCOUNTER — Telehealth: Payer: Self-pay | Admitting: *Deleted

## 2020-04-18 NOTE — Telephone Encounter (Signed)
1. Have you developed a fever since your procedure? no  2.   Have you had an respiratory symptoms (SOB or cough) since your procedure? no  3.   Have you tested positive for COVID 19 since your procedure no  4.   Have you had any family members/close contacts diagnosed with the COVID 19 since your procedure?  no   If yes to any of these questions please route to Laverna Peace, RN and Karlton Lemon, RN Follow up Call-  Call back number 04/14/2020  Post procedure Call Back phone  # [phone]  Permission to leave phone message Yes  Some recent data might be hidden     Patient questions:  Do you have a fever, pain , or abdominal swelling? No. Pain Score  0 *  Have you tolerated food without any problems? Yes.    Have you been able to return to your normal activities? Yes.    Do you have any questions about your discharge instructions: Diet   No. Medications  No. Follow up visit  No.  Do you have questions or concerns about your Care? No.  Actions: * If pain score is 4 or above: No action needed, pain <4.

## 2020-04-27 ENCOUNTER — Encounter: Payer: Self-pay | Admitting: Legal Medicine

## 2020-04-27 ENCOUNTER — Telehealth (INDEPENDENT_AMBULATORY_CARE_PROVIDER_SITE_OTHER): Payer: BC Managed Care – PPO | Admitting: Legal Medicine

## 2020-04-27 VITALS — Temp 97.8°F | Ht 62.0 in | Wt 254.0 lb

## 2020-04-27 DIAGNOSIS — J45901 Unspecified asthma with (acute) exacerbation: Secondary | ICD-10-CM | POA: Diagnosis not present

## 2020-04-27 DIAGNOSIS — Z9189 Other specified personal risk factors, not elsewhere classified: Secondary | ICD-10-CM | POA: Diagnosis not present

## 2020-04-27 HISTORY — DX: Unspecified asthma with (acute) exacerbation: J45.901

## 2020-04-27 MED ORDER — AZITHROMYCIN 250 MG PO TABS: ORAL_TABLET | ORAL | 0 refills | Status: DC

## 2020-04-27 MED ORDER — PREDNISONE 10 MG (21) PO TBPK: ORAL_TABLET | ORAL | 0 refills | Status: DC

## 2020-04-27 NOTE — Progress Notes (Signed)
Virtual Visit via Telephone Note   This visit type was conducted due to national recommendations for restrictions regarding the COVID-19 Pandemic (e.g. social distancing) in an effort to limit this patient's exposure and mitigate transmission in our community.  Due to her co-morbid illnesses, this patient is at least at moderate risk for complications without adequate follow up.  This format is felt to be most appropriate for this patient at this time.  The patient did not have access to video technology/had technical difficulties with video requiring transitioning to audio format only (telephone).  All issues noted in this document were discussed and addressed.  No physical exam could be performed with this format.  Patient verbally consented to a telehealth visit.   Date:  04/27/2020   ID:  Rebecca Rojas, DOB 1955/08/22, MRN [id number]  Patient Location: Home Provider Location: Office/Clinic  PCP:  Marge Duncans, PA-C   Evaluation Performed:  Follow-Up Visit  Chief Complaint:  1 week ago, she had allergy shot and go more congested during week, she is tight in chest, on albuterol. No fever  History of Present Illness:    Rebecca Rojas is a 64 y.o. adult with 1 week ago, she had allergy shot and go more congested during week, she is tight in chest, on albuterol. No fever   The patient does not have symptoms concerning for COVID-19 infection (fever, chills, cough, or new shortness of breath).    Past Medical History:  Diagnosis Date  . Allergy    dust, environmental, feathers, has epipen, prn  . Allergy-induced asthma   . Anemia   . Arthritis    osteoarthritis  . Back pain   . Headache(784.0)    occasional migraine  . History of seasonal allergies   . Joint pain   . Lower extremity edema   . Morbid obesity ([place])   . Osteoarthritis   . Shortness of breath    maybe with exertion  . Sleep apnea    uses cpap  . Vertigo   . Vitamin D deficiency     Past Surgical History:    Procedure Laterality Date  . ABDOMINAL HYSTERECTOMY    . COLONOSCOPY  09/2010   normal  . FRACTURE SURGERY     left 5th finger fx with bone graft repair  . HEEL SPUR EXCISION    . JOINT REPLACEMENT    . KNEE ARTHROSCOPY Left   . Left Knee lateral release    . LIPOMA EXCISION    . SHOULDER ARTHROSCOPY WITH ROTATOR CUFF REPAIR AND SUBACROMIAL DECOMPRESSION Right 06/15/2019   Procedure: SHOULDER ARTHROSCOPY WITH ROTATOR CUFF REPAIR AND SUBACROMIAL DECOMPRESSION;  Surgeon: Dorna Leitz, MD;  Location: [facility];  Service: Orthopedics;  Laterality: Right;  . TONSILLECTOMY    . TOTAL KNEE ARTHROPLASTY  08/03/2011   Procedure: TOTAL KNEE ARTHROPLASTY;  Surgeon: Alta Corning;  Location: [facility];  Service: Orthopedics;  Laterality: Right;  COMPUTER ASSISTED TOTAL KNEE REPLACEMENTwith revision tibial component  . TUBAL LIGATION      Family History  Problem Relation Age of Onset  . Hypertension Mother   . Heart disease Mother   . Heart attack Mother   . Hyperlipidemia Mother   . Stroke Mother   . Obesity Mother   . Cancer Father   . Liver disease Father   . Obesity Father   . Anesthesia problems Neg Hx   . Hypotension Neg Hx   . Malignant hyperthermia Neg Hx   . Pseudochol deficiency  Neg Hx   . Colon cancer Neg Hx   . Colon polyps Neg Hx   . Esophageal cancer Neg Hx   . Rectal cancer Neg Hx   . Stomach cancer Neg Hx     Social History   Socioeconomic History  . Marital status: Married    Spouse name: Kynisha Memon  . Number of children: 2  . Years of education: Not on file  . Highest education level: Not on file  Occupational History  . 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 kits  Tobacco Use  . Smoking status: Never Smoker  . Smokeless tobacco: Never Used  Vaping Use  . Vaping Use: Never used  Substance and Sexual Activity  . Alcohol use: No  . Drug use: No  . Sexual activity: Yes  Other Topics Concern  . Not on file  Social History Narrative  . Not on file   Social  Determinants of Health   Financial Resource Strain:   . Difficulty of Paying Living Expenses: Not on file  Food Insecurity:   . Worried About Charity fundraiser in the Last Year: Not on file  . Ran Out of Food in the Last Year: Not on file  Transportation Needs:   . Lack of Transportation (Medical): Not on file  . Lack of Transportation (Non-Medical): Not on file  Physical Activity:   . Days of Exercise per Week: Not on file  . Minutes of Exercise per Session: Not on file  Stress:   . Feeling of Stress : Not on file  Social Connections:   . Frequency of Communication with Friends and Family: Not on file  . Frequency of Social Gatherings with Friends and Family: Not on file  . Attends Religious Services: Not on file  . Active Member of Clubs or Organizations: Not on file  . Attends Archivist Meetings: Not on file  . Marital Status: Not on file  Intimate Partner Violence:   . Fear of Current or Ex-Partner: Not on file  . Emotionally Abused: Not on file  . Physically Abused: Not on file  . Sexually Abused: Not on file    Outpatient Medications Prior to Visit  Medication Sig Dispense Refill  . albuterol (PROVENTIL HFA;VENTOLIN HFA) 108 (90 BASE) MCG/ACT inhaler Inhale 1-2 puffs into the lungs every 6 (six) hours as needed for wheezing or shortness of breath.     . cyclobenzaprine (FLEXERIL) 10 MG tablet Take 10 mg by mouth 3 (three) times daily as needed for muscle spasms.    Marland Kitchen EPINEPHrine (EPIPEN 2-PAK) 0.3 mg/0.3 mL IJ SOAJ injection Inject 0.3 mg into the muscle as needed for anaphylaxis.    . fluticasone furoate-vilanterol (BREO ELLIPTA) 200-25 MCG/INH AEPB Inhale 1 puff into the lungs daily.     . furosemide (LASIX) 20 MG tablet Take 40 mg by mouth daily as needed (fluid retention.).     Marland Kitchen levocetirizine (XYZAL) 5 MG tablet Take 5 mg by mouth every evening.      . mometasone (NASONEX) 50 MCG/ACT nasal spray Place 2 sprays into the nose daily. 17 g 12  . montelukast  (SINGULAIR) 10 MG tablet Take 10 mg by mouth at bedtime.      Marland Kitchen PRESCRIPTION MEDICATION Inject 1 kit into the skin every 7 (seven) days. Allergy shot unknown to pt     . albuterol (PROVENTIL) (2.5 MG/3ML) 0.083% nebulizer solution Inhale 2.5 mg into the lungs every 4 (four) hours as needed for wheezing or shortness of  breath. (Patient not taking: Reported on 04/27/2020)    . azelastine (OPTIVAR) 0.05 % ophthalmic solution Place 1 drop into both eyes 2 (two) times daily as needed (allergy eyes.). (Patient not taking: Reported on 04/27/2020)     Facility-Administered Medications Prior to Visit  Medication Dose Route Frequency Provider Last Rate Last Admin  . 0.9 %  sodium chloride infusion  500 mL Intravenous Once Jackquline Denmark, MD       .med Allergies:   Cefdinir and Hydrocodone-homatropine   Social History   Tobacco Use  . Smoking status: Never Smoker  . Smokeless tobacco: Never Used  Vaping Use  . Vaping Use: Never used  Substance Use Topics  . Alcohol use: No  . Drug use: No     Review of Systems  Constitutional: Negative.   HENT: Negative.   Eyes: Negative.   Respiratory: Positive for cough, sputum production, shortness of breath and wheezing.   Cardiovascular: Negative.   Gastrointestinal: Negative.   Genitourinary: Negative.   Musculoskeletal: Negative.   Skin: Negative.   Neurological: Negative.   Psychiatric/Behavioral: Negative.      Labs/Other Tests and Data Reviewed:    Recent Labs: 09/01/2019: ALT 21; BUN 11; Creatinine, Ser 0.56; Hemoglobin 13.6; Platelets 309; Potassium 4.2; Sodium 142; TSH 2.770   Recent Lipid Panel Lab Results  Component Value Date/Time   CHOL 165 09/01/2019 12:26 PM   TRIG 151 (H) 09/01/2019 12:26 PM   HDL 47 09/01/2019 12:26 PM   LDLCALC 92 09/01/2019 12:26 PM    Wt Readings from Last 3 Encounters:  04/27/20 254 lb (115.2 kg)  04/14/20 263 lb (119.3 kg)  04/04/20 263 lb (119.3 kg)     Objective:    Vital Signs:  Temp 97.8 F  (36.6 C)   Ht 5' 2"  (1.575 m)   Wt 254 lb (115.2 kg)   BMI 46.46 kg/m    Physical Exam VS stable  ASSESSMENT & PLAN:   Diagnoses and all orders for this visit: At increased risk of exposure to COVID-19 virus -     Novel Coronavirus, NAA ([facility]) -     Rapid Strep A Moderate asthma with acute exacerbation, unspecified whether persistent -     predniSONE (STERAPRED UNI-PAK 21 TAB) 10 MG (21) TBPK tablet; Take 6ills first day , then 5 pills day 2 and then cut down one pill day until gone -     azithromycin (ZITHROMAX) 250 MG tablet; 2 tablets on day 1, then 1 tablet daily on days 2-6. Patient is having an asthma exacerbation and needs proof that it is on Covid.      No orders of the defined types were placed in this encounter.   COVID-19 Education: The signs and symptoms of COVID-19 were discussed with the patient and how to seek care for testing (follow up with PCP or arrange E-visit). The importance of social distancing was discussed today.  Time:   Today, I have spent 20 minutes with the patient with telehealth technology discussing the above problems.    Follow Up:  In Person prn  Signed, Reinaldo Meeker, MD  04/27/2020 10:59 AM    [facility]

## 2020-04-29 LAB — NOVEL CORONAVIRUS, NAA: SARS-CoV-2, NAA: NOT DETECTED

## 2020-04-29 LAB — SARS-COV-2, NAA 2 DAY TAT

## 2020-04-29 NOTE — Progress Notes (Signed)
Negative covid

## 2020-05-16 ENCOUNTER — Other Ambulatory Visit: Payer: Self-pay

## 2020-05-16 MED ORDER — AZITHROMYCIN 500 MG PO TABS: 500.0000 mg | ORAL_TABLET | Freq: Every day | ORAL | 0 refills | Status: DC

## 2020-05-17 ENCOUNTER — Telehealth: Payer: Self-pay | Admitting: Pulmonary Disease

## 2020-05-18 NOTE — Telephone Encounter (Signed)
Called and spoke with pt who states she has received a letter that her CPAP is being recalled. Stated to pt to contact insurance company to see if they will approve a replacement machine. Pt verbalized understanding. Nothing further needed.

## 2020-05-20 DIAGNOSIS — I4891 Unspecified atrial fibrillation: Secondary | ICD-10-CM

## 2020-05-20 DIAGNOSIS — G4730 Sleep apnea, unspecified: Secondary | ICD-10-CM | POA: Insufficient documentation

## 2020-05-20 HISTORY — DX: Unspecified atrial fibrillation: I48.91

## 2020-05-25 ENCOUNTER — Encounter: Payer: Self-pay | Admitting: Cardiology

## 2020-05-31 DIAGNOSIS — J45909 Unspecified asthma, uncomplicated: Secondary | ICD-10-CM | POA: Insufficient documentation

## 2020-05-31 DIAGNOSIS — M549 Dorsalgia, unspecified: Secondary | ICD-10-CM | POA: Insufficient documentation

## 2020-05-31 DIAGNOSIS — M2550 Pain in unspecified joint: Secondary | ICD-10-CM | POA: Insufficient documentation

## 2020-05-31 DIAGNOSIS — J4530 Mild persistent asthma, uncomplicated: Secondary | ICD-10-CM | POA: Insufficient documentation

## 2020-05-31 DIAGNOSIS — R0602 Shortness of breath: Secondary | ICD-10-CM | POA: Insufficient documentation

## 2020-05-31 DIAGNOSIS — D649 Anemia, unspecified: Secondary | ICD-10-CM | POA: Insufficient documentation

## 2020-05-31 DIAGNOSIS — R600 Localized edema: Secondary | ICD-10-CM | POA: Insufficient documentation

## 2020-05-31 DIAGNOSIS — M1990 Unspecified osteoarthritis, unspecified site: Secondary | ICD-10-CM | POA: Insufficient documentation

## 2020-05-31 DIAGNOSIS — Z889 Allergy status to unspecified drugs, medicaments and biological substances status: Secondary | ICD-10-CM | POA: Insufficient documentation

## 2020-05-31 DIAGNOSIS — T7840XA Allergy, unspecified, initial encounter: Secondary | ICD-10-CM | POA: Insufficient documentation

## 2020-06-01 NOTE — Progress Notes (Signed)
Cardiology Office Note:    Date:  06/02/2020   ID:  [name], DOB Jul 01, 1956, MRN [id number]  PCP:  Marge Duncans, PA-C  Cardiologist:  Shirlee More, MD   Referring MD: Threasa Heads, NP  ASSESSMENT:    1. Paroxysmal atrial fibrillation (HCC)   2. Elevated troponin   3. Hypertensive heart disease without heart failure   4. Obstructive sleep apnea syndrome    PLAN:    In order of problems listed above:  1. Maintaining sinus rhythm continue calcium channel blocker I think the precipitant here was proarrhythmia from erythromycin azithromycin and a break in her sleep apnea treatment and she is back on CPAP.  At this time not anticoagulate her antiarrhythmic drug and she will continue to monitor at home with her iPhone adapter. 2. Elevated troponin felt to be demand ischemia however she adamantly have underlying CAD we discussed modalities for an ischemia evaluation we will do cardiac CTA expedited.  If severe CAD flow-limiting may benefit from revascularization.  In the interim I asked her to take aspirin 81 mg daily 3. Stable BP at target continue her calcium channel blocker 4. Continue CPAP restarted at home  Next appointment 6 weeks   Medication Adjustments/Labs and Tests Ordered: Current medicines are reviewed at length with the patient today.  Concerns regarding medicines are outlined above.  Orders Placed This Encounter  Procedures  . CT CORONARY MORPH W/CTA COR W/SCORE W/CA W/CM &/OR WO/CM  . CT CORONARY FRACTIONAL FLOW RESERVE DATA PREP  . CT CORONARY FRACTIONAL FLOW RESERVE FLUID ANALYSIS  . EKG 12-Lead   Meds ordered this encounter  Medications  . aspirin EC 81 MG tablet    Sig: Take 1 tablet (81 mg total) by mouth daily. Swallow whole.    Dispense:  90 tablet    Refill:  3  . metoprolol tartrate (LOPRESSOR) 100 MG tablet    Sig: Take 1 tablet (100 mg total) by mouth once for 1 dose. Take two hours prior to cardiac CT    Dispense:  1 tablet    Refill:  0       Chief Complaint  Patient presents with  . Follow-up    Recently admitted [facility] SVT, PAF elevated troponin advise cardiology follow-up and ischemia evaluation    History of Present Illness:    Rebecca Rojas is a 64 y.o. adult who is being seen today for the evaluation of atrial fibrillation at the request of Threasa Heads, NP.  Chart review shows that she was admitted to [facility] hospital 05/20/2020 discharge 05/23/2020 with atrial fibrillation.  She was recommended to follow-up as an outpatient with cardiology and to have a ischemia evaluation.  She has a past medical history of obesity and sleep apnea presented to the hospital with chest pain found to be in SVT at a rate greater than 200 was given adenosine and subsequently found that she was in atrial fibrillation.  This was preceded by an upper respiratory infection treated as an outpatient with Zithromax and prednisone.  Soon after admission she converted spontaneously to sinus rhythm echocardiogram was performed prior to discharge described as showing normal left ventricular function.  Laboratory test showed a potassium 4.3 creatinine normal 0.93 normal liver function test cholesterol 142 LDL 76 triglycerides 131 HDL 40.  Chest x-ray showed central venous vascular congestion echocardiogram findings included mild left atrial enlargement.  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  Prior to admission she had been on a course of both erythromycin and azithromycin which are potentially proarrhythmic and we will count this as allergy in the future.  Echocardiography Findings Left Ventricle Normal left ventricular size. Wall thickness is normal. Systolic function is normal. EF: 55-60%. Wall motion is normal. Doppler parameters consistent with mild diastolic dysfunction and low to normal LA  pressure.  Right Ventricle Right ventricle appears normal. Systolic function is normal.  Left Atrium Left atrium was not well visualized. Left atrium is mildly dilated.  Right Atrium Normal sized right atrium.  Mitral Valve Mitral valve structure is normal. There is trace regurgitation.  Tricuspid Valve The tricuspid valve was not well visualized. There is no regurgitation. Unable to assess RVSP due to incomplete Doppler signal.  Aortic Valve The aortic valve is tricuspid. The leaflets are not thickened and exhibit normal excursion. There is no regurgitation or stenosis.  Pulmonic Valve The pulmonic valve was not well visualized. No regurgitation present on the pulmonic valve  Ascending Aorta The aortic root is normal in size.  Pericardium There is no pericardial effusion.   Her EKG showed incomplete right bundle branch block. Her high-sensitivity troponin was elevated with a significant delta and a normal N-terminal proBNP 98 Serial high-sensitivity troponins 8 21 76 Recent TSH 03/29/2020 3.425 with a normal T4 total elevated free with free thyroxine index 11.4  Since discharge she is done better she has had some mild wheezing no edema shortness of breath chest pain and brief palpitation not severe sustained.  She purchased the iPhone adapter reviews a single strip with me where she is in sinus rhythm and she will screen her self every day.  She was not anticoagulated brief atrial fibrillation I would not initiate it now.  She has had no further chest pain.  She tells me her atrial fibrillation lasted for about 6 hours before converted and her chest pain happened about 2 hours later and is relieved with morphine. Past Medical History:  Diagnosis Date  . Acute laryngopharyngitis 01/13/2020  . Acute non-recurrent frontal sinusitis 09/22/2017  . Allergy    dust, environmental, feathers, has epipen, prn  . Allergy-induced asthma   . Anemia   . Arthritis    osteoarthritis  . Arthritis of right  acromioclavicular joint 06/15/2019  . Asthma 12/28/2015  . Asthma with exacerbation 04/27/2020  . Atrial fibrillation with RVR ([place]) 05/20/2020  . Back pain   . Benign essential hypertension 12/28/2015  . Bilateral lower extremity edema 08/04/2018  . Encounter for screening colonoscopy 01/08/2017  . Glenoid labral tear, right, initial encounter 06/15/2019  . Headache(784.0)    occasional migraine  . Healthcare maintenance 01/08/2017  . High risk medication use 12/28/2015  . History of seasonal allergies   . Hordeolum internum of right lower eyelid 02/14/2017  . Insulin resistance 10/01/2019  . Joint pain   . Lower extremity edema   . Lower respiratory infection (e.g., bronchitis, pneumonia, pneumonitis, pulmonitis) 05/10/2009   Qualifier: Diagnosis of  By: Gwenette Greet MD, Armando Reichert   . Migraine headache 12/28/2015  . Mixed hyperlipidemia 12/28/2015  . Morbid obesity ([place])   . Myalgia 12/28/2015  . Osteoarthritis   . Pain of right eye 02/14/2017  . Pain of right foot 08/30/2016  . Persistent cough for 3 weeks or longer 10/11/2017  . Pharyngitis 05/23/2018  . Rotator cuff tear, right 06/15/2019  . Shortness of breath    maybe with  exertion  . Skin lesion of foot 08/30/2016  . Sleep apnea    uses cpap  . Sore throat 05/11/2018  . Unspecified open wound, left lower leg, initial encounter 08/04/2018  . Vertigo   . Vitamin D deficiency     Past Surgical History:  Procedure Laterality Date  . ABDOMINAL HYSTERECTOMY    . COLONOSCOPY  09/2010   normal  . FRACTURE SURGERY     left 5th finger fx with bone graft repair  . HEEL SPUR EXCISION    . JOINT REPLACEMENT    . KNEE ARTHROSCOPY Left   . Left Knee lateral release    . LIPOMA EXCISION    . SHOULDER ARTHROSCOPY WITH ROTATOR CUFF REPAIR AND SUBACROMIAL DECOMPRESSION Right 06/15/2019   Procedure: SHOULDER ARTHROSCOPY WITH ROTATOR CUFF REPAIR AND SUBACROMIAL DECOMPRESSION;  Surgeon: Dorna Leitz, MD;  Location: [facility];  Service: Orthopedics;   Laterality: Right;  . TONSILLECTOMY    . TOTAL KNEE ARTHROPLASTY  08/03/2011   Procedure: TOTAL KNEE ARTHROPLASTY;  Surgeon: Alta Corning;  Location: [place];  Service: Orthopedics;  Laterality: Right;  COMPUTER ASSISTED TOTAL KNEE REPLACEMENTwith revision tibial component  . TUBAL LIGATION      Current Medications: Current Meds  Medication Sig  . albuterol (PROVENTIL HFA;VENTOLIN HFA) 108 (90 BASE) MCG/ACT inhaler Inhale 1-2 puffs into the lungs every 6 (six) hours as needed for wheezing or shortness of breath.   Marland Kitchen azelastine (OPTIVAR) 0.05 % ophthalmic solution Place 1 drop into both eyes 2 (two) times daily.  . cyclobenzaprine (FLEXERIL) 10 MG tablet Take 10 mg by mouth 3 (three) times daily as needed for muscle spasms.  Marland Kitchen diltiazem (TIAZAC) 180 MG 24 hr capsule Take by mouth.  . EPINEPHrine (EPIPEN 2-PAK) 0.3 mg/0.3 mL IJ SOAJ injection Inject 0.3 mg into the muscle as needed for anaphylaxis.  . fluticasone furoate-vilanterol (BREO ELLIPTA) 100-25 MCG/INH AEPB Inhale into the lungs.  . furosemide (LASIX) 20 MG tablet Take 40 mg by mouth daily as needed (fluid retention.).   Marland Kitchen Homeopathic Products (LEG CRAMP RELIEF) TABS Take 1 tablet by mouth daily as needed.  Marland Kitchen ipratropium (ATROVENT) 0.02 % nebulizer solution Take 3 mLs by nebulization every 6 (six) hours as needed for wheezing or shortness of breath.  . levocetirizine (XYZAL) 5 MG tablet Take 5 mg by mouth every evening.    . mometasone (NASONEX) 50 MCG/ACT nasal spray Place 2 sprays into the nose daily.  . montelukast (SINGULAIR) 10 MG tablet Take 10 mg by mouth at bedtime.    Marland Kitchen PRESCRIPTION MEDICATION Inject 1 kit into the skin every 7 (seven) days. Allergy shot unknown to pt    Current Facility-Administered Medications for the 06/02/20 encounter (Office Visit) with Richardo Priest, MD  Medication  . 0.9 %  sodium chloride infusion     Allergies:   Sulfa antibiotics, Cefdinir, Erythromycin, and Hydrocodone-homatropine   Social  History   Socioeconomic History  . Marital status: Married    Spouse name: Aliesha Dolata  . Number of children: 2  . Years of education: Not on file  . Highest education level: Not on file  Occupational History  . 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 kits  Tobacco Use  . Smoking status: Never Smoker  . Smokeless tobacco: Never Used  Vaping Use  . Vaping Use: Never used  Substance and Sexual Activity  . Alcohol use: Not Currently  . Drug use: Never  . Sexual activity: Yes  Other Topics Concern  . Not  on file  Social History Narrative  . Not on file   Social Determinants of Health   Financial Resource Strain:   . Difficulty of Paying Living Expenses: Not on file  Food Insecurity:   . Worried About Charity fundraiser in the Last Year: Not on file  . Ran Out of Food in the Last Year: Not on file  Transportation Needs:   . Lack of Transportation (Medical): Not on file  . Lack of Transportation (Non-Medical): Not on file  Physical Activity:   . Days of Exercise per Week: Not on file  . Minutes of Exercise per Session: Not on file  Stress:   . Feeling of Stress : Not on file  Social Connections:   . Frequency of Communication with Friends and Family: Not on file  . Frequency of Social Gatherings with Friends and Family: Not on file  . Attends Religious Services: Not on file  . Active Member of Clubs or Organizations: Not on file  . Attends Archivist Meetings: Not on file  . Marital Status: Not on file     Family History: The patient's family history includes Cancer in her father; Heart attack in her mother; Heart disease in her mother; Hyperlipidemia in her mother; Hypertension in her mother; Liver disease in her father; Obesity in her father and mother; Stroke in her mother. There is no history of Anesthesia problems, Hypotension, Malignant hyperthermia, Pseudochol deficiency, Colon cancer, Colon polyps, Esophageal cancer, Rectal cancer, or Stomach cancer.  ROS:    ROS Please see the history of present illness.     All other systems reviewed and are negative.  EKGs/Labs/Other Studies Reviewed:    The following studies were reviewed today:   EKG:  EKG is  ordered today.  The ekg ordered today is personally reviewed and demonstrates sinus rhythm right bundle branch block incomplete otherwise normal  Recent Labs: 09/01/2019: ALT 21; BUN 11; Creatinine, Ser 0.56; Hemoglobin 13.6; Platelets 309; Potassium 4.2; Sodium 142; TSH 2.770  Recent Lipid Panel    Component Value Date/Time   CHOL 165 09/01/2019 1226   TRIG 151 (H) 09/01/2019 1226   HDL 47 09/01/2019 1226   LDLCALC 92 09/01/2019 1226    Physical Exam:    VS:  BP 122/81   Pulse 94   Ht 5' 1"  (1.549 m)   Wt 262 lb 3.2 oz (118.9 kg)   SpO2 96%   BMI 49.54 kg/m     Wt Readings from Last 3 Encounters:  06/02/20 262 lb 3.2 oz (118.9 kg)  05/23/20 267 lb 6.7 oz (121.3 kg)  04/27/20 254 lb (115.2 kg)     GEN: Obese BMI approaches 50 well nourished, well developed in no acute distress HEENT: Normal NECK: No JVD; No carotid bruits LYMPHATICS: No lymphadenopathy CARDIAC: RRR, no murmurs, rubs, gallops RESPIRATORY:  Clear to auscultation without rales, wheezing or rhonchi  ABDOMEN: Soft, non-tender, non-distended MUSCULOSKELETAL:  No edema; No deformity  SKIN: Warm and dry NEUROLOGIC:  Alert and oriented x 3 PSYCHIATRIC:  Normal affect     Signed, Shirlee More, MD  06/02/2020 4:47 PM    [facility]

## 2020-06-02 ENCOUNTER — Other Ambulatory Visit: Payer: Self-pay

## 2020-06-02 ENCOUNTER — Encounter: Payer: Self-pay | Admitting: Cardiology

## 2020-06-02 ENCOUNTER — Ambulatory Visit (INDEPENDENT_AMBULATORY_CARE_PROVIDER_SITE_OTHER): Payer: BC Managed Care – PPO | Admitting: Cardiology

## 2020-06-02 VITALS — BP 122/81 | HR 94 | Ht 61.0 in | Wt 262.2 lb

## 2020-06-02 DIAGNOSIS — I119 Hypertensive heart disease without heart failure: Secondary | ICD-10-CM | POA: Insufficient documentation

## 2020-06-02 DIAGNOSIS — I480 Paroxysmal atrial fibrillation: Secondary | ICD-10-CM

## 2020-06-02 DIAGNOSIS — G4733 Obstructive sleep apnea (adult) (pediatric): Secondary | ICD-10-CM

## 2020-06-02 DIAGNOSIS — R778 Other specified abnormalities of plasma proteins: Secondary | ICD-10-CM | POA: Diagnosis not present

## 2020-06-02 HISTORY — DX: Hypertensive heart disease without heart failure: I11.9

## 2020-06-02 MED ORDER — ASPIRIN EC 81 MG PO TBEC: 81.0000 mg | DELAYED_RELEASE_TABLET | Freq: Every day | ORAL | 3 refills | Status: DC

## 2020-06-02 MED ORDER — METOPROLOL TARTRATE 100 MG PO TABS: 100.0000 mg | ORAL_TABLET | Freq: Once | ORAL | 0 refills | Status: DC

## 2020-06-02 NOTE — Patient Instructions (Addendum)
Medication Instructions:  Your physician has recommended you make the following change in your medication:  START: Aspirin 81 mg take one tablet by mouth daily *If you need a refill on your cardiac medications before your next appointment, please call your pharmacy*   Lab Work: Your physician recommends that you return for lab work in: Within one week of your cardiac CT  BMP If you have labs (blood work) drawn today and your tests are completely normal, you will receive your results only by: Marland Kitchen MyChart Message (if you have MyChart) OR . A paper copy in the mail If you have any lab test that is abnormal or we need to change your treatment, we will call you to review the results.   Testing/Procedures: Your cardiac CT will be scheduled at the below location:   [facility] [address] [place],  [address] [phone]  If scheduled at [facility], please arrive at the [facility] main entrance of [facility] 30 minutes prior to test start time. Proceed to the [facility] Radiology Department (first floor) to check-in and test prep.   Please follow these instructions carefully (unless otherwise directed):   On the Night Before the Test: . Be sure to Drink plenty of water. . Do not consume any caffeinated/decaffeinated beverages or chocolate 12 hours prior to your test. . Do not take any antihistamines 12 hours prior to your test.  On the Day of the Test: . Drink plenty of water. Do not drink any water within one hour of the test. . Do not eat any food 4 hours prior to the test. . You may take your regular medications prior to the test.  . Take metoprolol (Lopressor) two hours prior to test. . HOLD Furosemide morning of the test. . FEMALES- please wear underwire-free bra if available        After the Test: . Drink plenty of water. . After receiving IV contrast, you may experience a mild flushed feeling. This is normal. . On occasion, you  may experience a mild rash up to 24 hours after the test. This is not dangerous. If this occurs, you can take Benadryl 25 mg and increase your fluid intake. . If you experience trouble breathing, this can be serious. If it is severe call 911 IMMEDIATELY. If it is mild, please call our office. . If you take any of these medications: Glipizide/Metformin, Avandament, Glucavance, please do not take 48 hours after completing test unless otherwise instructed.   Once we have confirmed authorization from your insurance company, we will call you to set up a date and time for your test. Based on how quickly your insurance processes prior authorizations requests, please allow up to 4 weeks to be contacted for scheduling your Cardiac CT appointment. Be advised that routine Cardiac CT appointments could be scheduled as many as 8 weeks after your provider has ordered it.  For non-scheduling related questions, please contact the cardiac imaging nurse navigator should you have any questions/concerns: Marchia Bond, Cardiac Imaging Nurse Navigator Burley Saver, Interim Cardiac Imaging Nurse [place] [facility] Direct Office Dial: [phone]   For scheduling needs, including cancellations and rescheduling, please call Vivien Rota at [phone], option [phone].      Follow-Up: At [facility], you and your health needs are our priority.  As part of our continuing mission to provide you with exceptional heart care, we have created designated Provider Care Teams.  These Care Teams include  your primary Cardiologist (physician) and Advanced Practice Providers (APPs -  Physician Assistants and Nurse Practitioners) who all work together to provide you with the care you need, when you need it.  We recommend signing up for the patient portal called "MyChart".  Sign up information is provided on this After Visit Summary.  MyChart is used to connect with patients for Virtual Visits (Telemedicine).   Patients are able to view lab/test results, encounter notes, upcoming appointments, etc.  Non-urgent messages can be sent to your provider as well.   To learn more about what you can do with MyChart, go to [email or web address].    Your next appointment:   6 week(s)  The format for your next appointment:   In Person  Provider:   Shirlee More, MD   Other Instructions

## 2020-06-03 NOTE — Addendum Note (Signed)
Addended by: Norman Herrlich on: 06/03/2020 08:33 AM   Modules accepted: Level of Service

## 2020-06-13 LAB — BASIC METABOLIC PANEL
BUN/Creatinine Ratio: 17 (ref 12–28)
BUN: 12 mg/dL (ref 8–27)
CO2: 23 mmol/L (ref 20–29)
Calcium: 8.8 mg/dL (ref 8.7–10.3)
Chloride: 105 mmol/L (ref 96–106)
Creatinine, Ser: 0.7 mg/dL (ref 0.57–1.00)
GFR calc Af Amer: 107 mL/min/{1.73_m2} (ref >59)
GFR calc non Af Amer: 93 mL/min/{1.73_m2} (ref >59)
Glucose: 120 mg/dL — ABNORMAL HIGH (ref 65–99)
Potassium: 4 mmol/L (ref 3.5–5.2)
Sodium: 141 mmol/L (ref 134–144)

## 2020-06-13 NOTE — Addendum Note (Signed)
Addended by: Eleonore Chiquito on: 06/13/2020 09:34 AM   Modules accepted: Orders

## 2020-06-14 ENCOUNTER — Telehealth: Payer: Self-pay

## 2020-06-14 NOTE — Telephone Encounter (Signed)
Spoke with patient regarding results and recommendation.  Patient verbalizes understanding and is agreeable to plan of care. Advised patient to call back with any issues or concerns.  

## 2020-06-14 NOTE — Telephone Encounter (Signed)
-----   Message from Thomasene Ripple, DO sent at 06/13/2020  4:39 PM EDT ----- Blood glucose slightly elevated but otherwise normal labs

## 2020-06-16 ENCOUNTER — Telehealth (HOSPITAL_COMMUNITY): Payer: Self-pay | Admitting: Emergency Medicine

## 2020-06-16 NOTE — Telephone Encounter (Signed)
Reaching out to patient to offer assistance regarding upcoming cardiac imaging study; pt verbalizes understanding of appt date/time, parking situation and where to check in, pre-test NPO status and medications ordered, and verified current allergies; name and call back number provided for further questions should they arise Rockwell Alexandria RN Navigator Cardiac Imaging [facility] [phone] office [phone] cell  Pt holding diuretic, inhalers, antihitamines,, taking PO metoprolol 2 hr prior to scan

## 2020-06-17 ENCOUNTER — Ambulatory Visit (HOSPITAL_COMMUNITY)
Admission: RE | Admit: 2020-06-17 | Discharge: 2020-06-17 | Disposition: A | Discharge status: Home / Self Care | Payer: BC Managed Care – PPO | Source: Ambulatory Visit | Attending: Cardiology | Admitting: Cardiology

## 2020-06-17 DIAGNOSIS — G4733 Obstructive sleep apnea (adult) (pediatric): Secondary | ICD-10-CM | POA: Diagnosis present

## 2020-06-17 DIAGNOSIS — I480 Paroxysmal atrial fibrillation: Secondary | ICD-10-CM | POA: Insufficient documentation

## 2020-06-17 DIAGNOSIS — R778 Other specified abnormalities of plasma proteins: Secondary | ICD-10-CM | POA: Insufficient documentation

## 2020-06-17 DIAGNOSIS — I119 Hypertensive heart disease without heart failure: Secondary | ICD-10-CM | POA: Diagnosis present

## 2020-06-17 MED ORDER — METOPROLOL TARTRATE 5 MG/5ML IV SOLN: 5.0000 mg | INTRAVENOUS | Status: DC | PRN

## 2020-06-17 MED ORDER — NITROGLYCERIN 0.4 MG SL SUBL
SUBLINGUAL_TABLET | SUBLINGUAL | Status: AC
  Filled 2020-06-17: qty 2

## 2020-06-17 MED ORDER — IOHEXOL 350 MG/ML SOLN
90.0000 mL | Freq: Once | INTRAVENOUS | Status: AC | PRN
  Administered 2020-06-17: 90 mL via INTRAVENOUS

## 2020-06-17 MED ORDER — METOPROLOL TARTRATE 5 MG/5ML IV SOLN
INTRAVENOUS | Status: AC
  Filled 2020-06-17: qty 5

## 2020-06-17 MED ORDER — NITROGLYCERIN 0.4 MG SL SUBL
0.8000 mg | SUBLINGUAL_TABLET | Freq: Once | SUBLINGUAL | Status: AC
  Administered 2020-06-17: 0.8 mg via SUBLINGUAL

## 2020-07-20 ENCOUNTER — Ambulatory Visit: Payer: Self-pay | Admitting: Cardiology

## 2020-07-28 NOTE — Progress Notes (Signed)
Cardiology Office Note:    Date:  07/29/2020   ID:  Rebecca Rojas, DOB 1956-04-22, MRN [id number]  PCP:  Marge Duncans, PA-C  Cardiologist:  Shirlee More, MD    Referring MD: Marge Duncans, PA-C    ASSESSMENT:    1. Paroxysmal atrial fibrillation (HCC)   2. Hypertensive heart disease without heart failure   3. Obstructive sleep apnea syndrome    PLAN:    In order of problems listed above:  1. Stable no clinical recurrence at this time would not anticoagulate or start antiarrhythmic drug without documented recurrent atrial fibrillation. 2. Stable she will resume her diltiazem I expect blood pressure to come into line she has been out for 1 week 3. Stable continue current treatment   Next appointment: 6 months and she will send me through my chart if she has an episode of atrial fibrillation on her iPhone heart rhythm adapter   Medication Adjustments/Labs and Tests Ordered: Current medicines are reviewed at length with the patient today.  Concerns regarding medicines are outlined above.  No orders of the defined types were placed in this encounter.  No orders of the defined types were placed in this encounter.   No chief complaint on file.   History of Present Illness:    Rebecca Rojas is a 64 y.o. adult with a hx of paroxysmal atrial fibrillation with elevated troponin felt to be due to rapid rate and demand ischemia hypertensive heart disease and obstructive sleep apnea syndrome last seen 06/02/2020.  Compliance with diet, lifestyle and medications: Yes  She is very relieved by the results of her cardiac CTA I reviewed with her and described it is near normal. She is aware of her heart she checked approximately 20 monitor strips at home reviewed with her all sinus rhythm or sinus tachycardia. No chest pain exercise intolerance edema shortness of breath or syncope.  She underwent cardiac CTA 06/17/2020.  Her calcium score was very low at 1 she had very mild minimal  plaque LAD less than 25% and no evidence of obstructive CAD.  The remainder of the thoracic over read was normal there was no valvular calcification in the aorta was normal in caliber.  Echocardiogram performed at [facility] showed EF of 55 to 83% normal diastolic filling pressure the right heart was normal left atrium is mildly dilated and there is no valvular abnormality.  Her EKG pattern is right bundle branch block.  She was not initiated on anticoagulation with a total duration of her single episode 6 hours. Past Medical History:  Diagnosis Date  . Acute laryngopharyngitis 01/13/2020  . Acute non-recurrent frontal sinusitis 09/22/2017  . Allergy    dust, environmental, feathers, has epipen, prn  . Allergy-induced asthma   . Anemia   . Arthritis    osteoarthritis  . Arthritis of right acromioclavicular joint 06/15/2019  . Asthma 12/28/2015  . Asthma with exacerbation 04/27/2020  . Atrial fibrillation with RVR ([place]) 05/20/2020  . Back pain   . Benign essential hypertension 12/28/2015  . Bilateral lower extremity edema 08/04/2018  . Encounter for screening colonoscopy 01/08/2017  . Glenoid labral tear, right, initial encounter 06/15/2019  . Headache(784.0)    occasional migraine  . Healthcare maintenance 01/08/2017  . High risk medication use 12/28/2015  . History of seasonal allergies   . Hordeolum internum of right lower eyelid 02/14/2017  . Insulin resistance 10/01/2019  . Joint pain   . Lower extremity edema   . Lower respiratory infection (e.g.,  bronchitis, pneumonia, pneumonitis, pulmonitis) 05/10/2009   Qualifier: Diagnosis of  By: Gwenette Greet MD, Armando Reichert   . Migraine headache 12/28/2015  . Mixed hyperlipidemia 12/28/2015  . Morbid obesity ([place])   . Myalgia 12/28/2015  . Osteoarthritis   . Pain of right eye 02/14/2017  . Pain of right foot 08/30/2016  . Persistent cough for 3 weeks or longer 10/11/2017  . Pharyngitis 05/23/2018  . Rotator cuff tear, right 06/15/2019  . Shortness of  breath    maybe with exertion  . Skin lesion of foot 08/30/2016  . Sleep apnea    uses cpap  . Sore throat 05/11/2018  . Unspecified open wound, left lower leg, initial encounter 08/04/2018  . Vertigo   . Vitamin D deficiency     Past Surgical History:  Procedure Laterality Date  . ABDOMINAL HYSTERECTOMY    . COLONOSCOPY  09/2010   normal  . FRACTURE SURGERY     left 5th finger fx with bone graft repair  . HEEL SPUR EXCISION    . JOINT REPLACEMENT    . KNEE ARTHROSCOPY Left   . Left Knee lateral release    . LIPOMA EXCISION    . SHOULDER ARTHROSCOPY WITH ROTATOR CUFF REPAIR AND SUBACROMIAL DECOMPRESSION Right 06/15/2019   Procedure: SHOULDER ARTHROSCOPY WITH ROTATOR CUFF REPAIR AND SUBACROMIAL DECOMPRESSION;  Surgeon: Dorna Leitz, MD;  Location: [facility];  Service: Orthopedics;  Laterality: Right;  . TONSILLECTOMY    . TOTAL KNEE ARTHROPLASTY  08/03/2011   Procedure: TOTAL KNEE ARTHROPLASTY;  Surgeon: Alta Corning;  Location: [place];  Service: Orthopedics;  Laterality: Right;  COMPUTER ASSISTED TOTAL KNEE REPLACEMENTwith revision tibial component  . TUBAL LIGATION      Current Medications: Current Meds  Medication Sig  . albuterol (PROVENTIL HFA;VENTOLIN HFA) 108 (90 BASE) MCG/ACT inhaler Inhale 1-2 puffs into the lungs every 6 (six) hours as needed for wheezing or shortness of breath.   Marland Kitchen aspirin EC 81 MG tablet Take 1 tablet (81 mg total) by mouth daily. Swallow whole.  Marland Kitchen azelastine (OPTIVAR) 0.05 % ophthalmic solution Place 1 drop into both eyes 2 (two) times daily.  . cyclobenzaprine (FLEXERIL) 10 MG tablet Take 10 mg by mouth 3 (three) times daily as needed for muscle spasms.  Marland Kitchen diltiazem (TIAZAC) 180 MG 24 hr capsule Take by mouth.  . EPINEPHrine 0.3 mg/0.3 mL IJ SOAJ injection Inject 0.3 mg into the muscle as needed for anaphylaxis.  . fluticasone furoate-vilanterol (BREO ELLIPTA) 100-25 MCG/INH AEPB Inhale into the lungs.  . furosemide (LASIX) 20 MG tablet Take 40 mg by  mouth daily as needed (fluid retention.).   Marland Kitchen Homeopathic Products (LEG CRAMP RELIEF) TABS Take 1 tablet by mouth daily as needed.  Marland Kitchen ipratropium (ATROVENT) 0.02 % nebulizer solution Take 3 mLs by nebulization every 6 (six) hours as needed for wheezing or shortness of breath.  . levocetirizine (XYZAL) 5 MG tablet Take 5 mg by mouth every evening.  . mometasone (NASONEX) 50 MCG/ACT nasal spray Place 2 sprays into the nose daily.  . montelukast (SINGULAIR) 10 MG tablet Take 10 mg by mouth at bedtime.  Marland Kitchen PRESCRIPTION MEDICATION Inject 1 kit into the skin every 7 (seven) days. Allergy shot unknown to pt   Current Facility-Administered Medications for the 07/29/20 encounter (Office Visit) with Richardo Priest, MD  Medication  . 0.9 %  sodium chloride infusion     Allergies:   Sulfa antibiotics, Cefdinir, Erythromycin, and Hydrocodone-homatropine   Social History   Socioeconomic  History  . Marital status: Married    Spouse name: Lurlie Wigen  . Number of children: 2  . Years of education: Not on file  . Highest education level: Not on file  Occupational History  . 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 kits  Tobacco Use  . Smoking status: Never Smoker  . Smokeless tobacco: Never Used  Vaping Use  . Vaping Use: Never used  Substance and Sexual Activity  . Alcohol use: Not Currently  . Drug use: Never  . Sexual activity: Yes  Other Topics Concern  . Not on file  Social History Narrative  . Not on file   Social Determinants of Health   Financial Resource Strain: Not on file  Food Insecurity: Not on file  Transportation Needs: Not on file  Physical Activity: Not on file  Stress: Not on file  Social Connections: Not on file     Family History: The patient's family history includes Cancer in her father; Heart attack in her mother; Heart disease in her mother; Hyperlipidemia in her mother; Hypertension in her mother; Liver disease in her father; Obesity in her father and mother;  Stroke in her mother. There is no history of Anesthesia problems, Hypotension, Malignant hyperthermia, Pseudochol deficiency, Colon cancer, Colon polyps, Esophageal cancer, Rectal cancer, or Stomach cancer. ROS:   Please see the history of present illness.    All other systems reviewed and are negative.  EKGs/Labs/Other Studies Reviewed:    The following studies were reviewed today:    Recent Labs: 09/01/2019: ALT 21; Hemoglobin 13.6; Platelets 309; TSH 2.770 06/13/2020: BUN 12; Creatinine, Ser 0.70; Potassium 4.0; Sodium 141  Recent Lipid Panel    Component Value Date/Time   CHOL 165 09/01/2019 1226   TRIG 151 (H) 09/01/2019 1226   HDL 47 09/01/2019 1226   LDLCALC 92 09/01/2019 1226    Physical Exam:    VS:  BP (!) 154/81   Pulse 80   Ht 5' 1" (1.549 m)   Wt 263 lb 9.6 oz (119.6 kg)   SpO2 97%   BMI 49.81 kg/m     Wt Readings from Last 3 Encounters:  07/29/20 263 lb 9.6 oz (119.6 kg)  06/02/20 262 lb 3.2 oz (118.9 kg)  05/23/20 267 lb 6.7 oz (121.3 kg)     GEN:  Well nourished, well developed in no acute distress HEENT: Normal NECK: No JVD; No carotid bruits LYMPHATICS: No lymphadenopathy CARDIAC: RRR, no murmurs, rubs, gallops RESPIRATORY:  Clear to auscultation without rales, wheezing or rhonchi  ABDOMEN: Soft, non-tender, non-distended MUSCULOSKELETAL:  No edema; No deformity  SKIN: Warm and dry NEUROLOGIC:  Alert and oriented x 3 PSYCHIATRIC:  Normal affect    Signed, Shirlee More, MD  07/29/2020 8:52 AM    [place]

## 2020-07-29 ENCOUNTER — Encounter: Payer: Self-pay | Admitting: Cardiology

## 2020-07-29 ENCOUNTER — Ambulatory Visit (INDEPENDENT_AMBULATORY_CARE_PROVIDER_SITE_OTHER): Payer: Self-pay | Admitting: Cardiology

## 2020-07-29 ENCOUNTER — Other Ambulatory Visit: Payer: Self-pay

## 2020-07-29 VITALS — BP 154/81 | HR 80 | Ht 61.0 in | Wt 263.6 lb

## 2020-07-29 DIAGNOSIS — G4733 Obstructive sleep apnea (adult) (pediatric): Secondary | ICD-10-CM

## 2020-07-29 DIAGNOSIS — I119 Hypertensive heart disease without heart failure: Secondary | ICD-10-CM

## 2020-07-29 DIAGNOSIS — I480 Paroxysmal atrial fibrillation: Secondary | ICD-10-CM

## 2020-07-29 NOTE — Patient Instructions (Signed)

## 2020-08-02 MED ORDER — DILTIAZEM HCL ER BEADS 180 MG PO CP24
180.0000 mg | ORAL_CAPSULE | Freq: Every day | ORAL | 3 refills | Status: DC
Start: 2020-08-02 — End: 2021-06-26

## 2020-10-18 NOTE — Progress Notes (Signed)
Cardiology Office Note:    Date:  10/19/2020   ID:  Rebecca Rojas, DOB [birth year], MRN [id number]  PCP:  Marge Duncans, PA-C  Cardiologist:  Shirlee More, MD    Referring MD: Marge Duncans, PA-C    ASSESSMENT:    1. Paroxysmal atrial fibrillation (HCC)   2. Hypertensive heart disease without heart failure   3. Obstructive sleep apnea syndrome    PLAN:    In order of problems listed above:  1. She had a fibrillation background of previous episode and her initiation [place] appeared to be a narrow complex tachycardia SVT or atypical atrial flutter. Would add beta-blocker to calcium channel blocker anticoagulant refer to EP for consideration of catheter ablation. 2. Stable BP at target continue current treatment 3. Stable and treated   Next appointment: I will see her after EP care has been complete [facility] admission [facility] admission   Medication Adjustments/Labs and Tests Ordered: Current medicines are reviewed at length with the patient today.  Concerns regarding medicines are outlined above.  Orders Placed This Encounter  Procedures  . Ambulatory referral to Cardiac Electrophysiology   Meds ordered this encounter  Medications  . apixaban (ELIQUIS) 5 MG TABS tablet    Sig: Take 1 tablet (5 mg total) by mouth 2 (two) times daily.    Dispense:  180 tablet    Refill:  3  . metoprolol succinate (TOPROL XL) 25 MG 24 hr tablet    Sig: Take 1 tablet (25 mg total) by mouth daily.    Dispense:  90 tablet    Refill:  3    No chief complaint on file.   History of Present Illness:    Rebecca Rojas is a 64 y.o. adult with a hx of paroxysmal atrial fibrillation obstructive sleep apnea hypertensive heart disease and demand ischemia with rapid rate last seen 07/29/2020 she underwent cardiac CTA October 2021 showed a very low calcium score of 1 and minimal plaque less than 25% left anterior descending coronary artery.  Echocardiogram showed normal ejection  fraction mild left atrial enlargement no valvular abnormality.  She had a single episode of atrial fibrillation and was not placed on anticoagulation.  Compliance with diet, lifestyle and medications: Yes  She was admitted to [facility] with an episode of SVT and subsequently after conversion atrial fibrillation 10/14/2020. She was told she be seen by our group we were not consulted. She had a rapid rate from her iPhone adapter the rate was over 200 this time and contrast her troponin was not elevated. With recurrent SVT and atrial fibrillation 2 hospitalizations that initiate anticoagulation add beta-blockers or calcium channel blocker refer to EP for catheter ablation. Past Medical History:  Diagnosis Date  . Acute laryngopharyngitis 01/13/2020  . Acute non-recurrent frontal sinusitis 09/22/2017  . Allergy    dust, environmental, feathers, has epipen, prn  . Allergy-induced asthma   . Anemia   . Arthritis    osteoarthritis  . Arthritis of right acromioclavicular joint 06/15/2019  . Asthma 12/28/2015  . Asthma with exacerbation 04/27/2020  . Atrial fibrillation with RVR ([place]) 05/20/2020  . Back pain   . Benign essential hypertension 12/28/2015  . Bilateral lower extremity edema 08/04/2018  . Encounter for screening colonoscopy 01/08/2017  . Glenoid labral tear, right, initial encounter 06/15/2019  . Headache(784.0)    occasional migraine  . Healthcare maintenance 01/08/2017  . High risk medication use 12/28/2015  . History of seasonal allergies   . Hordeolum internum  of right lower eyelid 02/14/2017  . Insulin resistance 10/01/2019  . Joint pain   . Lower extremity edema   . Lower respiratory infection (e.g., bronchitis, pneumonia, pneumonitis, pulmonitis) 05/10/2009   Qualifier: Diagnosis of  By: Gwenette Greet MD, Armando Reichert   . Migraine headache 12/28/2015  . Mixed hyperlipidemia 12/28/2015  . Morbid obesity ([place])   . Myalgia 12/28/2015  . Osteoarthritis   . Pain of right eye 02/14/2017  .  Pain of right foot 08/30/2016  . Persistent cough for 3 weeks or longer 10/11/2017  . Pharyngitis 05/23/2018  . Rotator cuff tear, right 06/15/2019  . Shortness of breath    maybe with exertion  . Skin lesion of foot 08/30/2016  . Sleep apnea    uses cpap  . Sore throat 05/11/2018  . Unspecified open wound, left lower leg, initial encounter 08/04/2018  . Vertigo   . Vitamin D deficiency     Past Surgical History:  Procedure Laterality Date  . ABDOMINAL HYSTERECTOMY    . COLONOSCOPY  09/2010   normal  . FRACTURE SURGERY     left 5th finger fx with bone graft repair  . HEEL SPUR EXCISION    . JOINT REPLACEMENT    . KNEE ARTHROSCOPY Left   . Left Knee lateral release    . LIPOMA EXCISION    . SHOULDER ARTHROSCOPY WITH ROTATOR CUFF REPAIR AND SUBACROMIAL DECOMPRESSION Right 06/15/2019   Procedure: SHOULDER ARTHROSCOPY WITH ROTATOR CUFF REPAIR AND SUBACROMIAL DECOMPRESSION;  Surgeon: Dorna Leitz, MD;  Location: [facility];  Service: Orthopedics;  Laterality: Right;  . TONSILLECTOMY    . TOTAL KNEE ARTHROPLASTY  08/03/2011   Procedure: TOTAL KNEE ARTHROPLASTY;  Surgeon: Alta Corning;  Location: [place];  Service: Orthopedics;  Laterality: Right;  COMPUTER ASSISTED TOTAL KNEE REPLACEMENTwith revision tibial component  . TUBAL LIGATION      Current Medications: Current Meds  Medication Sig  . albuterol (PROVENTIL HFA;VENTOLIN HFA) 108 (90 BASE) MCG/ACT inhaler Inhale 1-2 puffs into the lungs every 6 (six) hours as needed for wheezing or shortness of breath.   Marland Kitchen apixaban (ELIQUIS) 5 MG TABS tablet Take 1 tablet (5 mg total) by mouth 2 (two) times daily.  Marland Kitchen atorvastatin (LIPITOR) 20 MG tablet Take 20 mg by mouth daily.  Marland Kitchen azelastine (OPTIVAR) 0.05 % ophthalmic solution Place 1 drop into both eyes 2 (two) times daily.  . cyclobenzaprine (FLEXERIL) 10 MG tablet Take 10 mg by mouth 3 (three) times daily as needed for muscle spasms.  Marland Kitchen diltiazem (TIAZAC) 180 MG 24 hr capsule Take 1 capsule (180  mg total) by mouth daily.  Marland Kitchen EPINEPHrine 0.3 mg/0.3 mL IJ SOAJ injection Inject 0.3 mg into the muscle as needed for anaphylaxis.  . fluticasone furoate-vilanterol (BREO ELLIPTA) 100-25 MCG/INH AEPB Inhale into the lungs.  . furosemide (LASIX) 20 MG tablet Take 40 mg by mouth daily as needed (fluid retention.).   Marland Kitchen ipratropium (ATROVENT) 0.02 % nebulizer solution Take 3 mLs by nebulization every 6 (six) hours as needed for wheezing or shortness of breath.  . levocetirizine (XYZAL) 5 MG tablet Take 5 mg by mouth every evening.  . metoprolol succinate (TOPROL XL) 25 MG 24 hr tablet Take 1 tablet (25 mg total) by mouth daily.  . mometasone (NASONEX) 50 MCG/ACT nasal spray Place 2 sprays into the nose daily.  . mometasone (NASONEX) 50 MCG/ACT nasal spray daily as needed.  . montelukast (SINGULAIR) 10 MG tablet Take 10 mg by mouth at bedtime.  Marland Kitchen  PRESCRIPTION MEDICATION Inject 1 kit into the skin every 7 (seven) days. Allergy shot unknown to pt  . [DISCONTINUED] aspirin EC 81 MG tablet Take 1 tablet (81 mg total) by mouth daily. Swallow whole.   Current Facility-Administered Medications for the 10/19/20 encounter (Office Visit) with Richardo Priest, MD  Medication  . 0.9 %  sodium chloride infusion     Allergies:   Sulfa antibiotics, Cefdinir, Erythromycin, and Hydrocodone-homatropine   Social History   Socioeconomic History  . Marital status: Married    Spouse name: Liesl Simons  . Number of children: 2  . Years of education: Not on file  . Highest education level: Not on file  Occupational History  . 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 kits  Tobacco Use  . Smoking status: Never Smoker  . Smokeless tobacco: Never Used  Vaping Use  . Vaping Use: Never used  Substance and Sexual Activity  . Alcohol use: Not Currently  . Drug use: Never  . Sexual activity: Yes  Other Topics Concern  . Not on file  Social History Narrative  . Not on file   Social Determinants of Health   Financial  Resource Strain: Not on file  Food Insecurity: Not on file  Transportation Needs: Not on file  Physical Activity: Not on file  Stress: Not on file  Social Connections: Not on file     Family History: The patient's family history includes Cancer in her father; Heart attack in her mother; Heart disease in her mother; Hyperlipidemia in her mother; Hypertension in her mother; Liver disease in her father; Obesity in her father and mother; Stroke in her mother. There is no history of Anesthesia problems, Hypotension, Malignant hyperthermia, Pseudochol deficiency, Colon cancer, Colon polyps, Esophageal cancer, Rectal cancer, or Stomach cancer. ROS:   Please see the history of present illness.    All other systems reviewed and are negative.  EKGs/Labs/Other Studies Reviewed:    The following studies were reviewed today:  EKG: EKG [facility] ER showed SVT with rate approximately 208 bpm narrow QRS. I do not have rhythm strips from the hospital.  Recent Labs: 06/13/2020: BUN 12; Creatinine, Ser 0.70; Potassium 4.0; Sodium 141  Recent Lipid Panel    Component Value Date/Time   CHOL 165 09/01/2019 1226   TRIG 151 (H) 09/01/2019 1226   HDL 47 09/01/2019 1226   LDLCALC 92 09/01/2019 1226    Physical Exam:    VS:  BP 130/84   Pulse 100   Ht 5' 1"  (1.549 m)   Wt 264 lb (119.7 kg)   SpO2 98%   BMI 49.88 kg/m     Wt Readings from Last 3 Encounters:  10/19/20 264 lb (119.7 kg)  07/29/20 263 lb 9.6 oz (119.6 kg)  06/02/20 262 lb 3.2 oz (118.9 kg)     GEN:  Well nourished, well developed in no acute distress HEENT: Normal NECK: No JVD; No carotid bruits LYMPHATICS: No lymphadenopathy CARDIAC: RRR, no murmurs, rubs, gallops RESPIRATORY:  Clear to auscultation without rales, wheezing or rhonchi  ABDOMEN: Soft, non-tender, non-distended MUSCULOSKELETAL:  No edema; No deformity  SKIN: Warm and dry NEUROLOGIC:  Alert and oriented x 3 PSYCHIATRIC:  Normal affect     Signed, Shirlee More, MD  10/19/2020 2:17 PM    [facility]

## 2020-10-19 ENCOUNTER — Other Ambulatory Visit: Payer: Self-pay

## 2020-10-19 ENCOUNTER — Encounter: Payer: Self-pay | Admitting: Cardiology

## 2020-10-19 ENCOUNTER — Ambulatory Visit (INDEPENDENT_AMBULATORY_CARE_PROVIDER_SITE_OTHER): Payer: 59 | Admitting: Cardiology

## 2020-10-19 VITALS — BP 130/84 | HR 100 | Ht 61.0 in | Wt 264.0 lb

## 2020-10-19 DIAGNOSIS — I4891 Unspecified atrial fibrillation: Secondary | ICD-10-CM

## 2020-10-19 DIAGNOSIS — G4733 Obstructive sleep apnea (adult) (pediatric): Secondary | ICD-10-CM

## 2020-10-19 DIAGNOSIS — I480 Paroxysmal atrial fibrillation: Secondary | ICD-10-CM

## 2020-10-19 DIAGNOSIS — I119 Hypertensive heart disease without heart failure: Secondary | ICD-10-CM

## 2020-10-19 MED ORDER — METOPROLOL SUCCINATE ER 25 MG PO TB24: 25.0000 mg | ORAL_TABLET | Freq: Every day | ORAL | 3 refills | Status: DC

## 2020-10-19 MED ORDER — APIXABAN 5 MG PO TABS: 5.0000 mg | ORAL_TABLET | Freq: Two times a day (BID) | ORAL | 3 refills | Status: DC

## 2020-10-19 NOTE — Patient Instructions (Signed)
Medication Instructions:  Your physician has recommended you make the following change in your medication:  STOP: Aspirin START: Toprol-Xl 25 mg daily START: Eliquis 5 mg twice daily *If you need a refill on your cardiac medications before your next appointment, please call your pharmacy*   Lab Work: None If you have labs (blood work) drawn today and your tests are completely normal, you will receive your results only by: Marland Kitchen MyChart Message (if you have MyChart) OR . A paper copy in the mail If you have any lab test that is abnormal or we need to change your treatment, we will call you to review the results.   Testing/Procedures: None   Follow-Up: At [facility], you and your health needs are our priority.  As part of our continuing mission to provide you with exceptional heart care, we have created designated Provider Care Teams.  These Care Teams include your primary Cardiologist (physician) and Advanced Practice Providers (APPs -  Physician Assistants and Nurse Practitioners) who all work together to provide you with the care you need, when you need it.  We recommend signing up for the patient portal called "MyChart".  Sign up information is provided on this After Visit Summary.  MyChart is used to connect with patients for Virtual Visits (Telemedicine).  Patients are able to view lab/test results, encounter notes, upcoming appointments, etc.  Non-urgent messages can be sent to your provider as well.   To learn more about what you can do with MyChart, go to [email or web address].    Your next appointment:  After seen by Dr. Elberta Fortis   The format for your next appointment:   In Person  Provider:   Norman Herrlich, MD   Other Instructions

## 2020-10-20 ENCOUNTER — Telehealth: Payer: Self-pay | Admitting: Pharmacist

## 2020-10-20 ENCOUNTER — Encounter: Payer: Self-pay | Admitting: Cardiology

## 2020-10-20 ENCOUNTER — Ambulatory Visit (INDEPENDENT_AMBULATORY_CARE_PROVIDER_SITE_OTHER): Payer: 59 | Admitting: Cardiology

## 2020-10-20 VITALS — BP 138/74 | HR 69 | Ht 61.0 in | Wt 264.0 lb

## 2020-10-20 DIAGNOSIS — Z01812 Encounter for preprocedural laboratory examination: Secondary | ICD-10-CM

## 2020-10-20 DIAGNOSIS — I471 Supraventricular tachycardia: Secondary | ICD-10-CM | POA: Diagnosis not present

## 2020-10-20 NOTE — Progress Notes (Deleted)
Electrophysiology Office Note   Date:  10/20/2020   ID:  Rebecca Rojas, DOB 1955/09/01, MRN [id number]  PCP:  Marge Duncans, PA-C  Cardiologist:  Bettina Gavia Primary Electrophysiologist:  Timber Marshman Meredith Leeds, MD    Chief Complaint: AF, SVT   History of Present Illness: Rebecca Rojas is a 65 y.o. adult who is being seen today for the evaluation of AF, SVT at the request of Marge Duncans, [place]. Presenting today for electrophysiology evaluation.  She has a history significant for paroxysmal atrial fibrillation, obstructive sleep apnea, hypertension.  She was [facility] to [facility] with an episode of SVT.  SVT degenerated into atrial fibrillation.  She is status post cardioversion 10/14/2020.  Today, she denies*** symptoms of palpitations, chest pain, shortness of breath, orthopnea, PND, lower extremity edema, claudication, dizziness, presyncope, syncope, bleeding, or neurologic sequela. The patient is tolerating medications without difficulties.    Past Medical History:  Diagnosis Date  . Acute laryngopharyngitis 01/13/2020  . Acute non-recurrent frontal sinusitis 09/22/2017  . Allergy    dust, environmental, feathers, has epipen, prn  . Allergy-induced asthma   . Anemia   . Arthritis    osteoarthritis  . Arthritis of right acromioclavicular joint 06/15/2019  . Asthma 12/28/2015  . Asthma with exacerbation 04/27/2020  . Atrial fibrillation with RVR ([place]) 05/20/2020  . Back pain   . Benign essential hypertension 12/28/2015  . Bilateral lower extremity edema 08/04/2018  . Encounter for screening colonoscopy 01/08/2017  . Glenoid labral tear, right, initial encounter 06/15/2019  . Headache(784.0)    occasional migraine  . Healthcare maintenance 01/08/2017  . High risk medication use 12/28/2015  . History of seasonal allergies   . Hordeolum internum of right lower eyelid 02/14/2017  . Insulin resistance 10/01/2019  . Joint pain   . Lower extremity edema   . Lower respiratory infection  (e.g., bronchitis, pneumonia, pneumonitis, pulmonitis) 05/10/2009   Qualifier: Diagnosis of  By: Gwenette Greet MD, Armando Reichert   . Migraine headache 12/28/2015  . Mixed hyperlipidemia 12/28/2015  . Morbid obesity ([place])   . Myalgia 12/28/2015  . Osteoarthritis   . Pain of right eye 02/14/2017  . Pain of right foot 08/30/2016  . Persistent cough for 3 weeks or longer 10/11/2017  . Pharyngitis 05/23/2018  . Rotator cuff tear, right 06/15/2019  . Shortness of breath    maybe with exertion  . Skin lesion of foot 08/30/2016  . Sleep apnea    uses cpap  . Sore throat 05/11/2018  . Unspecified open wound, left lower leg, initial encounter 08/04/2018  . Vertigo   . Vitamin D deficiency    Past Surgical History:  Procedure Laterality Date  . ABDOMINAL HYSTERECTOMY    . COLONOSCOPY  09/2010   normal  . FRACTURE SURGERY     left 5th finger fx with bone graft repair  . HEEL SPUR EXCISION    . JOINT REPLACEMENT    . KNEE ARTHROSCOPY Left   . Left Knee lateral release    . LIPOMA EXCISION    . SHOULDER ARTHROSCOPY WITH ROTATOR CUFF REPAIR AND SUBACROMIAL DECOMPRESSION Right 06/15/2019   Procedure: SHOULDER ARTHROSCOPY WITH ROTATOR CUFF REPAIR AND SUBACROMIAL DECOMPRESSION;  Surgeon: Dorna Leitz, MD;  Location: [facility];  Service: Orthopedics;  Laterality: Right;  . TONSILLECTOMY    . TOTAL KNEE ARTHROPLASTY  08/03/2011   Procedure: TOTAL KNEE ARTHROPLASTY;  Surgeon: Alta Corning;  Location: [place];  Service: Orthopedics;  Laterality: Right;  COMPUTER ASSISTED TOTAL KNEE REPLACEMENTwith  revision tibial component  . TUBAL LIGATION       Current Outpatient Medications  Medication Sig Dispense Refill  . albuterol (PROVENTIL HFA;VENTOLIN HFA) 108 (90 BASE) MCG/ACT inhaler Inhale 1-2 puffs into the lungs every 6 (six) hours as needed for wheezing or shortness of breath.     Marland Kitchen apixaban (ELIQUIS) 5 MG TABS tablet Take 1 tablet (5 mg total) by mouth 2 (two) times daily. 180 tablet 3  . atorvastatin (LIPITOR) 20 MG  tablet Take 20 mg by mouth daily.    Marland Kitchen azelastine (OPTIVAR) 0.05 % ophthalmic solution Place 1 drop into both eyes 2 (two) times daily.    . cyclobenzaprine (FLEXERIL) 10 MG tablet Take 10 mg by mouth 3 (three) times daily as needed for muscle spasms.    Marland Kitchen diltiazem (TIAZAC) 180 MG 24 hr capsule Take 1 capsule (180 mg total) by mouth daily. 90 capsule 3  . EPINEPHrine 0.3 mg/0.3 mL IJ SOAJ injection Inject 0.3 mg into the muscle as needed for anaphylaxis.    . fluticasone furoate-vilanterol (BREO ELLIPTA) 100-25 MCG/INH AEPB Inhale into the lungs.    . furosemide (LASIX) 20 MG tablet Take 40 mg by mouth daily as needed (fluid retention.).     Marland Kitchen ipratropium (ATROVENT) 0.02 % nebulizer solution Take 3 mLs by nebulization every 6 (six) hours as needed for wheezing or shortness of breath.    . levocetirizine (XYZAL) 5 MG tablet Take 5 mg by mouth every evening.    . metoprolol succinate (TOPROL XL) 25 MG 24 hr tablet Take 1 tablet (25 mg total) by mouth daily. 90 tablet 3  . mometasone (NASONEX) 50 MCG/ACT nasal spray Place 2 sprays into the nose daily. 17 g 12  . mometasone (NASONEX) 50 MCG/ACT nasal spray daily as needed.    . montelukast (SINGULAIR) 10 MG tablet Take 10 mg by mouth at bedtime.    Marland Kitchen PRESCRIPTION MEDICATION Inject 1 kit into the skin every 7 (seven) days. Allergy shot unknown to pt     Current Facility-Administered Medications  Medication Dose Route Frequency Provider Last Rate Last Admin  . 0.9 %  sodium chloride infusion  500 mL Intravenous Once Jackquline Denmark, MD        Allergies:   Sulfa antibiotics, Cefdinir, Erythromycin, and Hydrocodone-homatropine   Social History:  The patient  reports that she has never smoked. She has never used smokeless tobacco. She reports previous alcohol use. She reports that she does not use drugs.   Family History:  The patient's family history includes Cancer in her father; Heart attack in her mother; Heart disease in her mother; Hyperlipidemia  in her mother; Hypertension in her mother; Liver disease in her father; Obesity in her father and mother; Stroke in her mother.    ROS:  Please see the history of present illness.   Otherwise, review of systems is positive for none.   All other systems are reviewed and negative.    PHYSICAL EXAM: VS:  There were no vitals taken for this visit. , BMI There is no height or weight on file to calculate BMI. GEN: Well nourished, well developed, in no acute distress  HEENT: normal  Neck: no JVD, carotid bruits, or masses Cardiac: ***RRR; no murmurs, rubs, or gallops,no edema  Respiratory:  clear to auscultation bilaterally, normal work of breathing GI: soft, nontender, nondistended, + BS MS: no deformity or atrophy  Skin: warm and dry Neuro:  Strength and sensation are intact Psych: euthymic mood, full affect  EKG:  EKG {ACTION; IS/IS [id number]} ordered today. Personal review of the ekg ordered *** shows ***  Recent Labs: 06/13/2020: BUN 12; Creatinine, Ser 0.70; Potassium 4.0; Sodium 141    Lipid Panel     Component Value Date/Time   CHOL 165 09/01/2019 1226   TRIG 151 (H) 09/01/2019 1226   HDL 47 09/01/2019 1226   LDLCALC 92 09/01/2019 1226     Wt Readings from Last 3 Encounters:  10/19/20 264 lb (119.7 kg)  07/29/20 263 lb 9.6 oz (119.6 kg)  06/02/20 262 lb 3.2 oz (118.9 kg)      Other studies Reviewed: Additional studies/ records that were reviewed today include: TTE 05/23/2020 Review of the above records today demonstrates:  . LeftVentricle: Wall thickness is normal.  . LeftVentricle: Wall motion is normal.  . LeftVentricle: Doppler parameters consistent with mild diastolic  dysfunction and low to normal LA pressure.  . LeftVentricle: Systolic function is normal. EF: 55-60%.  . LeftAtrium: Left atrium is mildly dilated.  . No significant valvular abnormality.    ASSESSMENT AND PLAN:  1.  SVT: [facility]urred at [facility].  Heart rate of 208 bpm.   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  She would likely benefit from ablation.  Risk and benefits were discussed which include bleeding, tamponade, heart block, stroke, damage to chest organs.  She understands these risks and has agreed to the procedure.  2.  Paroxysmal atrial fibrillation: CHA2DS2-VASc of 2.  Currently on Eliquis, diltiazem, metoprolol.***  3.  Obstructive sleep apnea: CPAP compliance encouraged    Current medicines are reviewed at length with the patient today.   The patient {ACTIONS; HAS/DOES NOT HAVE:19233} concerns regarding her medicines.  The following changes were made today:  {NONE DEFAULTED:18576::"none"}  Labs/ tests ordered today include: *** No orders of the defined types were placed in this encounter.    Disposition:   FU with Will Camnitz {gen number [id number]} {Days to years:10300}  Signed, Will Meredith Leeds, MD  10/20/2020 10:05 AM     [facility] [address] [place] [address] [phone] (office) [phone] (fax)

## 2020-10-20 NOTE — Patient Instructions (Signed)
Medication Instructions:  Your physician recommends that you continue on your current medications as directed. Please refer to the Current Medication list given to you today.  *If you need a refill on your cardiac medications before your next appointment, please call your pharmacy*   Lab Work: None ordered If you have labs (blood work) drawn today and your tests are completely normal, you will receive your results only by: Marland Kitchen MyChart Message (if you have MyChart) OR . A paper copy in the mail If you have any lab test that is abnormal or we need to change your treatment, we will call you to review the results.   Testing/Procedures: Your physician has recommended that you have an ablation. Catheter ablation is a medical procedure used to treat some cardiac arrhythmias (irregular heartbeats). During catheter ablation, a long, thin, flexible tube is put into a blood vessel in your groin (upper thigh), or neck. This tube is called an ablation catheter. It is then guided to your heart through the blood vessel. Radio frequency waves destroy small areas of heart tissue where abnormal heartbeats may cause an arrhythmia to start. Please see the instructions below located under "other instructions".   Follow-Up: At [facility], you and your health needs are our priority.  As part of our continuing mission to provide you with exceptional heart care, we have created designated Provider Care Teams.  These Care Teams include your primary Cardiologist (physician) and Advanced Practice Providers (APPs -  Physician Assistants and Nurse Practitioners) who all work together to provide you with the care you need, when you need it.  Your next appointment:   4 week(s) after your ablation on 4/19  The format for your next appointment:   In Person  Provider:   Loman Brooklyn, MD    Thank you for choosing [facility]!!   Dory Horn, RN [phone]   Other Instructions     Electrophysiology/Ablation Procedure Instructions   You are scheduled for a(n)  ablation on 12/06/2020 with Dr. Loman Brooklyn.   1.   Pre procedure testing-             A.  LAB WORK --- between 11/07/20 - 12/02/20 for your pre procedure blood work.  You do NOT need to be fasting. You can stop by the [place] office for this lab work (avoid lunch time hours)               B. COVID TEST-- On 12/05/20 @ 8:30 am - This is a Drive Up Visit at [address] [place], [place] [address].  Someone will direct you to the appropriate testing line. Stay in your car and someone will be with you shortly.   After you are tested please go home and self quarantine until the day of your procedure.     PROCEDURE DAY: 2. On the day of your procedure 12/06/2020 you will go to [facility] [phone]) at 8:30 am.  Bonita Quin will go to the main entrance [employer]) and enter where the [employer] are.  Your driver will drop you off and you will head down the hallway to ADMITTING.  You may have one support person come in to the hospital with you.  They will be asked to wait in the waiting room.  It is OK to have someone drop you off and come back when you are ready to be discharged.   3.   Do not eat or drink after  midnight prior to your procedure.   4.   Hold your Metoprolol & Diltiazem 3 days prior to this procedure.  Take your last dose of these medications on 12/02/2020       Do NOT take any medications the morning of your procedure.   5.  Plan for an overnight stay, but you may be discharged home after your procedure. If you use your phone frequently bring your phone charger, in case you have to stay.  If you are discharged after your procedure you will need someone to drive you home and be with your for 24 hours after your procedure.   6. You will follow up with Dr. Elberta Fortis  1 month after your procedure.  This appointment will be made for you.   * If you have ANY questions please call the office  [phone] and ask for Lela Gell RN or send me a MyChart message   * Occasionally, EP Studies and ablations can become lengthy.  Please make your family aware of this before your procedure starts.  Average time ranges from 2-8 hours for EP studies/ablations.  Your physician will call your family after the procedure with the results.                                     Cardiac Ablation Cardiac ablation is a procedure to destroy (ablate) some heart tissue that is sending bad signals. These bad signals cause problems in heart rhythm. The heart has many areas that make these signals. If there are problems in these areas, they can make the heart beat in a way that is not normal. Destroying some tissues can help make the heart rhythm normal. Tell your doctor about:  Any allergies you have.  All medicines you are taking. These include vitamins, herbs, eye drops, creams, and over-the-counter medicines.  Any problems you or family members have had with medicines that make you fall asleep (anesthetics).  Any blood disorders you have.  Any surgeries you have had.  Any medical conditions you have, such as kidney failure.  Whether you are pregnant or may be pregnant. What are the risks? This is a safe procedure. But problems may occur, including:  Infection.  Bruising and bleeding.  Bleeding into the chest.  Stroke or blood clots.  Damage to nearby areas of your body.  Allergies to medicines or dyes.  The need for a pacemaker if the normal system is damaged.  Failure of the procedure to treat the problem. What happens before the procedure? Medicines Ask your doctor about:  Changing or stopping your normal medicines. This is important.  Taking aspirin and ibuprofen. Do not take these medicines unless your doctor tells you to take them.  Taking other medicines, vitamins, herbs, and supplements. General instructions  Follow instructions from your doctor about what you cannot  eat or drink.  Plan to have someone take you home from the hospital or clinic.  If you will be going home right after the procedure, plan to have someone with you for 24 hours.  Ask your doctor what steps will be taken to prevent infection. What happens during the procedure?  An IV tube will be put into one of your veins.  You will be given a medicine to help you relax.  The skin on your neck or groin will be numbed.  A cut (incision) will be made in your neck or groin.  A needle will be put through your cut and into a large vein.  A tube (catheter) will be put into the needle. The tube will be moved to your heart.  Dye may be put through the tube. This helps your doctor see your heart.  Small devices (electrodes) on the tube will send out signals.  A type of energy will be used to destroy some heart tissue.  The tube will be taken out.  Pressure will be held on your cut. This helps stop bleeding.  A bandage will be put over your cut. The exact procedure may vary among doctors and hospitals.   What happens after the procedure?  You will be watched until you leave the hospital or clinic. This includes checking your heart rate, breathing rate, oxygen, and blood pressure.  Your cut will be watched for bleeding. You will need to lie still for a few hours.  Do not drive for 24 hours or as long as your doctor tells you. Summary  Cardiac ablation is a procedure to destroy some heart tissue. This is done to treat heart rhythm problems.  Tell your doctor about any medical conditions you may have. Tell him or her about all medicines you are taking to treat them.  This is a safe procedure. But problems may occur. These include infection, bruising, bleeding, and damage to nearby areas of your body.  Follow what your doctor tells you about food and drink. You may also be told to change or stop some of your medicines.  After the procedure, do not drive for 24 hours or as long as your  doctor tells you. This information is not intended to replace advice given to you by your health care provider. Make sure you discuss any questions you have with your health care provider. Document Revised: 07/09/2019 Document Reviewed: 07/09/2019 Elsevier Patient Education  2021 [employer].

## 2020-10-20 NOTE — Progress Notes (Signed)
Electrophysiology Office Note   Date:  10/20/2020   ID:  ONA ROEHRS, DOB 01/11/1956, MRN [id number]  PCP:  Marge Duncans, PA-C  Cardiologist:  Bettina Gavia Primary Electrophysiologist:  Will Meredith Leeds, MD    Chief Complaint: AF, SVT   History of Present Illness: Rebecca Rojas is a 65 y.o. adult who is being seen today for the evaluation of AF, SVT at the request of Marge Duncans, [place]. Presenting today for electrophysiology evaluation.  She has a history significant for paroxysmal atrial fibrillation, obstructive sleep apnea, hypertension.  She was admitted to [facility] with an episode of SVT.  SVT degenerated into atrial fibrillation.  She states that she converted back to sinus rhythm on her own after being on diltiazem.  She had a episode prior to this in October with similar symptoms.  She states that her heart rate got quite a bit faster during that episode.  She has weakness, fatigue, shortness of breath and some mild chest pain when her episodes occur.  Today, she denies symptoms of palpitations, chest pain, shortness of breath, orthopnea, PND, lower extremity edema, claudication, dizziness, presyncope, syncope, bleeding, or neurologic sequela. The patient is tolerating medications without difficulties.    Past Medical History:  Diagnosis Date  . Acute laryngopharyngitis 01/13/2020  . Acute non-recurrent frontal sinusitis 09/22/2017  . Allergy    dust, environmental, feathers, has epipen, prn  . Allergy-induced asthma   . Anemia   . Arthritis    osteoarthritis  . Arthritis of right acromioclavicular joint 06/15/2019  . Asthma 12/28/2015  . Asthma with exacerbation 04/27/2020  . Atrial fibrillation with RVR ([place]) 05/20/2020  . Back pain   . Benign essential hypertension 12/28/2015  . Bilateral lower extremity edema 08/04/2018  . Encounter for screening colonoscopy 01/08/2017  . Glenoid labral tear, right, initial encounter 06/15/2019  . Headache(784.0)    occasional  migraine  . Healthcare maintenance 01/08/2017  . High risk medication use 12/28/2015  . History of seasonal allergies   . Hordeolum internum of right lower eyelid 02/14/2017  . Insulin resistance 10/01/2019  . Joint pain   . Lower extremity edema   . Lower respiratory infection (e.g., bronchitis, pneumonia, pneumonitis, pulmonitis) 05/10/2009   Qualifier: Diagnosis of  By: Gwenette Greet MD, Armando Reichert   . Migraine headache 12/28/2015  . Mixed hyperlipidemia 12/28/2015  . Morbid obesity ([place])   . Myalgia 12/28/2015  . Osteoarthritis   . Pain of right eye 02/14/2017  . Pain of right foot 08/30/2016  . Persistent cough for 3 weeks or longer 10/11/2017  . Pharyngitis 05/23/2018  . Rotator cuff tear, right 06/15/2019  . Shortness of breath    maybe with exertion  . Skin lesion of foot 08/30/2016  . Sleep apnea    uses cpap  . Sore throat 05/11/2018  . Unspecified open wound, left lower leg, initial encounter 08/04/2018  . Vertigo   . Vitamin D deficiency    Past Surgical History:  Procedure Laterality Date  . ABDOMINAL HYSTERECTOMY    . COLONOSCOPY  09/2010   normal  . FRACTURE SURGERY     left 5th finger fx with bone graft repair  . HEEL SPUR EXCISION    . JOINT REPLACEMENT    . KNEE ARTHROSCOPY Left   . Left Knee lateral release    . LIPOMA EXCISION    . SHOULDER ARTHROSCOPY WITH ROTATOR CUFF REPAIR AND SUBACROMIAL DECOMPRESSION Right 06/15/2019   Procedure: SHOULDER ARTHROSCOPY WITH ROTATOR CUFF REPAIR AND SUBACROMIAL  DECOMPRESSION;  Surgeon: Dorna Leitz, MD;  Location: [facility];  Service: Orthopedics;  Laterality: Right;  . TONSILLECTOMY    . TOTAL KNEE ARTHROPLASTY  08/03/2011   Procedure: TOTAL KNEE ARTHROPLASTY;  Surgeon: Alta Corning;  Location: [facility];  Service: Orthopedics;  Laterality: Right;  COMPUTER ASSISTED TOTAL KNEE REPLACEMENTwith revision tibial component  . TUBAL LIGATION       Current Outpatient Medications  Medication Sig Dispense Refill  . albuterol (PROVENTIL  HFA;VENTOLIN HFA) 108 (90 BASE) MCG/ACT inhaler Inhale 1-2 puffs into the lungs every 6 (six) hours as needed for wheezing or shortness of breath.     Marland Kitchen apixaban (ELIQUIS) 5 MG TABS tablet Take 1 tablet (5 mg total) by mouth 2 (two) times daily. 180 tablet 3  . azelastine (OPTIVAR) 0.05 % ophthalmic solution Place 1 drop into both eyes 2 (two) times daily.    . cyclobenzaprine (FLEXERIL) 10 MG tablet Take 10 mg by mouth 3 (three) times daily as needed for muscle spasms.    Marland Kitchen diltiazem (TIAZAC) 180 MG 24 hr capsule Take 1 capsule (180 mg total) by mouth daily. 90 capsule 3  . EPINEPHrine 0.3 mg/0.3 mL IJ SOAJ injection Inject 0.3 mg into the muscle as needed for anaphylaxis.    . fluticasone furoate-vilanterol (BREO ELLIPTA) 100-25 MCG/INH AEPB Inhale into the lungs.    . furosemide (LASIX) 20 MG tablet Take 40 mg by mouth daily as needed (fluid retention.).     Marland Kitchen ipratropium (ATROVENT) 0.02 % nebulizer solution Take 3 mLs by nebulization every 6 (six) hours as needed for wheezing or shortness of breath.    . levocetirizine (XYZAL) 5 MG tablet Take 5 mg by mouth every evening.    . metoprolol succinate (TOPROL XL) 25 MG 24 hr tablet Take 1 tablet (25 mg total) by mouth daily. 90 tablet 3  . mometasone (NASONEX) 50 MCG/ACT nasal spray Place 2 sprays into the nose daily. 17 g 12  . mometasone (NASONEX) 50 MCG/ACT nasal spray daily as needed.    . montelukast (SINGULAIR) 10 MG tablet Take 10 mg by mouth at bedtime.    Marland Kitchen PRESCRIPTION MEDICATION Inject 1 kit into the skin every 7 (seven) days. Allergy shot unknown to pt     Current Facility-Administered Medications  Medication Dose Route Frequency Provider Last Rate Last Admin  . 0.9 %  sodium chloride infusion  500 mL Intravenous Once Jackquline Denmark, MD        Allergies:   Sulfa antibiotics, Cefdinir, Erythromycin, and Hydrocodone-homatropine   Social History:  The patient  reports that she has never smoked. She has never used smokeless tobacco. She  reports previous alcohol use. She reports that she does not use drugs.   Family History:  The patient's family history includes Cancer in her father; Heart attack in her mother; Heart disease in her mother; Hyperlipidemia in her mother; Hypertension in her mother; Liver disease in her father; Obesity in her father and mother; Stroke in her mother.    ROS:  Please see the history of present illness.   Otherwise, review of systems is positive for none.   All other systems are reviewed and negative.    PHYSICAL EXAM: VS:  BP 138/74   Pulse 69   Ht 5' 1"  (1.549 m)   Wt 264 lb (119.7 kg)   SpO2 99%   BMI 49.88 kg/m  , BMI Body mass index is 49.88 kg/m. GEN: Well nourished, well developed, in no acute distress  HEENT: normal  Neck: no JVD, carotid bruits, or masses Cardiac: RRR; no murmurs, rubs, or gallops,no edema  Respiratory:  clear to auscultation bilaterally, normal work of breathing GI: soft, nontender, nondistended, + BS MS: no deformity or atrophy  Skin: warm and dry Neuro:  Strength and sensation are intact Psych: euthymic mood, full affect  EKG:  EKG is ordered today. Personal review of the ekg ordered shows sinus rhythm, rate 69  Recent Labs: 06/13/2020: BUN 12; Creatinine, Ser 0.70; Potassium 4.0; Sodium 141    Lipid Panel     Component Value Date/Time   CHOL 165 09/01/2019 1226   TRIG 151 (H) 09/01/2019 1226   HDL 47 09/01/2019 1226   LDLCALC 92 09/01/2019 1226     Wt Readings from Last 3 Encounters:  10/20/20 264 lb (119.7 kg)  10/19/20 264 lb (119.7 kg)  07/29/20 263 lb 9.6 oz (119.6 kg)      Other studies Reviewed: Additional studies/ records that were reviewed today include: TTE 05/23/2020 Review of the above records today demonstrates:  . LeftVentricle: Wall thickness is normal.  . LeftVentricle: Wall motion is normal.  . LeftVentricle: Doppler parameters consistent with mild diastolic  dysfunction and low to normal LA pressure.  .  LeftVentricle: Systolic function is normal. EF: 55-60%.  . LeftAtrium: Left atrium is mildly dilated.  . No significant valvular abnormality.    ASSESSMENT AND PLAN:  1.  SVT: Occurred at [facility].  Heart rate of 208 bpm.  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  She would likely benefit from ablation.  Risk and benefits were discussed which include bleeding, tamponade, heart block, stroke, damage to chest organs.  She understands these risks and has agreed to the procedure.  We will have her hold her metoprolol and diltiazem for 3 days prior to the procedure.  2.  Paroxysmal atrial fibrillation: CHA2DS2-VASc of 2.  Currently on Eliquis, diltiazem, metoprolol.  We will plan for ablation of SVT prior to atrial fibrillation as it appears that her SVT is a trigger.  3.  Obstructive sleep apnea: CPAP compliance encouraged  Case discussed with primary cardiology  Current medicines are reviewed at length with the patient today.   The patient does not have concerns regarding her medicines.  The following changes were made today:  none  Labs/ tests ordered today include:  Orders Placed This Encounter  Procedures  . Basic metabolic panel  . CBC  . EKG 12-Lead     Disposition:   FU with Will Camnitz 3 months  Signed, Will Meredith Leeds, MD  10/20/2020 11:54 AM     [facility] [address] [place] [address] [phone] (office) [phone] (fax)

## 2020-10-20 NOTE — Telephone Encounter (Signed)
lmom pt that the new coumadin visit scheduled at [facility] is no longer needed and instructed the pt to call us if they have any issues

## 2020-10-20 NOTE — Telephone Encounter (Signed)
Received message about new Coumadin start as pt was seen yesterday in [place] office and today in [place]. Pt was under the impression she did not have active prescription coverage and that Eliquis would be cost prohibitive.   Pt does have active commercial insurance scanned into Epic. I was able to provide this info as well as activate a $10/month copay card for pt which brought her Eliquis copay from $1500 down to $30 for a 3 month supply at her pharmacy. Pt was appreciative for the assistance and is aware to take Eliquis as directed with no need to transition to Coumadin.

## 2020-11-15 ENCOUNTER — Encounter: Payer: Self-pay | Admitting: Physician Assistant

## 2020-11-15 ENCOUNTER — Ambulatory Visit (INDEPENDENT_AMBULATORY_CARE_PROVIDER_SITE_OTHER): Payer: 59 | Admitting: Physician Assistant

## 2020-11-15 ENCOUNTER — Other Ambulatory Visit: Payer: Self-pay

## 2020-11-15 VITALS — BP 130/70 | HR 98 | Temp 97.2°F | Ht 61.0 in | Wt 264.4 lb

## 2020-11-15 DIAGNOSIS — M5441 Lumbago with sciatica, right side: Secondary | ICD-10-CM

## 2020-11-15 MED ORDER — CYCLOBENZAPRINE HCL 10 MG PO TABS: 10.0000 mg | ORAL_TABLET | Freq: Three times a day (TID) | ORAL | 1 refills | Status: DC | PRN

## 2020-11-15 MED ORDER — PREDNISONE 20 MG PO TABS: ORAL_TABLET | ORAL | 0 refills | Status: AC

## 2020-11-15 NOTE — Progress Notes (Signed)
Acute Office Visit  Subjective:    Patient ID: Rebecca Rojas, adult    DOB: Oct 22, 1955, 65 y.o.   MRN: [id number]  Chief Complaint  Patient presents with  . Muscle Pain    HPI Patient is in today for complaints of low back pain - she states 2 months ago she had similar symptoms Cannot recall specific injury or trauma States that the pain is in her right lower back and radiates to right buttock and down the back of her right leg This episode started 3 days ago She is using tylenol and applying heat/ice  Past Medical History:  Diagnosis Date  . Acute laryngopharyngitis 01/13/2020  . Acute non-recurrent frontal sinusitis 09/22/2017  . Allergy    dust, environmental, feathers, has epipen, prn  . Allergy-induced asthma   . Anemia   . Arthritis    osteoarthritis  . Arthritis of right acromioclavicular joint 06/15/2019  . Asthma 12/28/2015  . Asthma with exacerbation 04/27/2020  . Atrial fibrillation with RVR ([place]) 05/20/2020  . Back pain   . Benign essential hypertension 12/28/2015  . Bilateral lower extremity edema 08/04/2018  . Encounter for screening colonoscopy 01/08/2017  . Glenoid labral tear, right, initial encounter 06/15/2019  . Headache(784.0)    occasional migraine  . Healthcare maintenance 01/08/2017  . High risk medication use 12/28/2015  . History of seasonal allergies   . Hordeolum internum of right lower eyelid 02/14/2017  . Insulin resistance 10/01/2019  . Joint pain   . Lower extremity edema   . Lower respiratory infection (e.g., bronchitis, pneumonia, pneumonitis, pulmonitis) 05/10/2009   Qualifier: Diagnosis of  By: Gwenette Greet MD, Armando Reichert   . Migraine headache 12/28/2015  . Mixed hyperlipidemia 12/28/2015  . Morbid obesity ([employer])   . Myalgia 12/28/2015  . Osteoarthritis   . Pain of right eye 02/14/2017  . Pain of right foot 08/30/2016  . Persistent cough for 3 weeks or longer 10/11/2017  . Pharyngitis 05/23/2018  . Rotator cuff tear, right 06/15/2019  . Shortness of  breath    maybe with exertion  . Skin lesion of foot 08/30/2016  . Sleep apnea    uses cpap  . Sore throat 05/11/2018  . Unspecified open wound, left lower leg, initial encounter 08/04/2018  . Vertigo   . Vitamin D deficiency     Past Surgical History:  Procedure Laterality Date  . ABDOMINAL HYSTERECTOMY    . COLONOSCOPY  09/2010   normal  . FRACTURE SURGERY     left 5th finger fx with bone graft repair  . HEEL SPUR EXCISION    . JOINT REPLACEMENT    . KNEE ARTHROSCOPY Left   . Left Knee lateral release    . LIPOMA EXCISION    . SHOULDER ARTHROSCOPY WITH ROTATOR CUFF REPAIR AND SUBACROMIAL DECOMPRESSION Right 06/15/2019   Procedure: SHOULDER ARTHROSCOPY WITH ROTATOR CUFF REPAIR AND SUBACROMIAL DECOMPRESSION;  Surgeon: Dorna Leitz, MD;  Location: [facility];  Service: Orthopedics;  Laterality: Right;  . TONSILLECTOMY    . TOTAL KNEE ARTHROPLASTY  08/03/2011   Procedure: TOTAL KNEE ARTHROPLASTY;  Surgeon: Alta Corning;  Location: [facility];  Service: Orthopedics;  Laterality: Right;  COMPUTER ASSISTED TOTAL KNEE REPLACEMENTwith revision tibial component  . TUBAL LIGATION      Family History  Problem Relation Age of Onset  . Hypertension Mother   . Heart disease Mother   . Heart attack Mother   . Hyperlipidemia Mother   . Stroke Mother   . Obesity  Mother   . Cancer Father   . Liver disease Father   . Obesity Father   . Anesthesia problems Neg Hx   . Hypotension Neg Hx   . Malignant hyperthermia Neg Hx   . Pseudochol deficiency Neg Hx   . Colon cancer Neg Hx   . Colon polyps Neg Hx   . Esophageal cancer Neg Hx   . Rectal cancer Neg Hx   . Stomach cancer Neg Hx     Social History   Socioeconomic History  . Marital status: Married    Spouse name: Valorie Mcgrory  . Number of children: 2  . Years of education: Not on file  . Highest education level: Not on file  Occupational History  . 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  Tobacco Use  . Smoking status: Never Smoker   . Smokeless tobacco: Never Used  Vaping Use  . Vaping Use: Never used  Substance and Sexual Activity  . Alcohol use: Not Currently  . Drug use: Never  . Sexual activity: Yes  Other Topics Concern  . Not on file  Social History Narrative  . Not on file   Social Determinants of Health   Financial Resource Strain: Not on file  Food Insecurity: Not on file  Transportation Needs: Not on file  Physical Activity: Not on file  Stress: Not on file  Social Connections: Not on file  Intimate Partner Violence: Not on file    Outpatient Medications Prior to Visit  Medication Sig Dispense Refill  . albuterol (PROVENTIL HFA;VENTOLIN HFA) 108 (90 BASE) MCG/ACT inhaler Inhale 1-2 puffs into the lungs every 6 (six) hours as needed for wheezing or shortness of breath.     Marland Kitchen apixaban (ELIQUIS) 5 MG TABS tablet Take 1 tablet (5 mg total) by mouth 2 (two) times daily. 180 tablet 3  . azelastine (OPTIVAR) 0.05 % ophthalmic solution Place 1 drop into both eyes 2 (two) times daily.    Marland Kitchen diltiazem (TIAZAC) 180 MG 24 hr capsule Take 1 capsule (180 mg total) by mouth daily. 90 capsule 3  . EPINEPHrine 0.3 mg/0.3 mL IJ SOAJ injection Inject 0.3 mg into the muscle as needed for anaphylaxis.    . fluticasone furoate-vilanterol (BREO ELLIPTA) 100-25 MCG/INH AEPB Inhale into the lungs.    . furosemide (LASIX) 20 MG tablet Take 40 mg by mouth daily as needed (fluid retention.).     Marland Kitchen ipratropium (ATROVENT) 0.02 % nebulizer solution Take 3 mLs by nebulization every 6 (six) hours as needed for wheezing or shortness of breath.    . levocetirizine (XYZAL) 5 MG tablet Take 5 mg by mouth every evening.    . metoprolol succinate (TOPROL XL) 25 MG 24 hr tablet Take 1 tablet (25 mg total) by mouth daily. 90 tablet 3  . mometasone (NASONEX) 50 MCG/ACT nasal spray Place 2 sprays into the nose daily. 17 g 12  . montelukast (SINGULAIR) 10 MG tablet Take 10 mg by mouth at bedtime.    Marland Kitchen PRESCRIPTION MEDICATION Inject 1 kit  into the skin every 7 (seven) days. Allergy shot unknown to pt    . cyclobenzaprine (FLEXERIL) 10 MG tablet Take 10 mg by mouth 3 (three) times daily as needed for muscle spasms.    . mometasone (NASONEX) 50 MCG/ACT nasal spray daily as needed.     Facility-Administered Medications Prior to Visit  Medication Dose Route Frequency Provider Last Rate Last Admin  . 0.9 %  sodium chloride infusion  500 mL Intravenous Once  Jackquline Denmark, MD        Allergies  Allergen Reactions  . Sulfa Antibiotics Itching  . Cefdinir Itching  . Erythromycin   . Hydrocodone-Homatropine Rash    Review of Systems CONSTITUTIONAL: Negative for chills, fatigue, fever, unintentional weight gain and unintentional weight loss.  CARDIOVASCULAR: Negative for chest pain, dizziness, palpitations and pedal edema.  RESPIRATORY: Negative for recent cough and dyspnea.  GASTROINTESTINAL: Negative for abdominal pain, acid reflux symptoms, constipation, diarrhea, nausea and vomiting.  MSK: see HPI INTEGUMENTARY: Negative for rash.         Objective:    Physical Exam PHYSICAL EXAM:   VS: BP 130/70 (BP Location: Left Arm, Patient Position: Sitting, Cuff Size: Large)   Pulse 98   Temp (!) 97.2 F (36.2 C) (Temporal)   Ht 5' 1"  (1.549 m)   Wt 264 lb 6.4 oz (119.9 kg)   SpO2 97%   BMI 49.96 kg/m   GEN: Well nourished, well developed, in no acute distress  Cardiac: RRR; no murmurs, rubs, or gallops,no edema -  Respiratory:  normal respiratory rate and pattern with no distress - normal breath sounds with no rales, rhonchi, wheezes or rubs MS: palpable tenderness to right lower back - SLR negative but causes discomfort to lower back with movement Skin: warm and dry, no rash   BP 130/70 (BP Location: Left Arm, Patient Position: Sitting, Cuff Size: Large)   Pulse 98   Temp (!) 97.2 F (36.2 C) (Temporal)   Ht 5' 1"  (1.549 m)   Wt 264 lb 6.4 oz (119.9 kg)   SpO2 97%   BMI 49.96 kg/m  Wt Readings from Last 3  Encounters:  11/15/20 264 lb 6.4 oz (119.9 kg)  10/20/20 264 lb (119.7 kg)  10/19/20 264 lb (119.7 kg)    Health Maintenance Due  Topic Date Due  . Hepatitis C Screening  Never done  . HIV Screening  Never done  . PAP SMEAR-Modifier  Never done  . MAMMOGRAM  Never done  . COVID-19 Vaccine (3 - Booster for Pfizer series) 06/29/2020    There are no preventive care reminders to display for this patient.   Lab Results  Component Value Date   TSH 2.770 09/01/2019   Lab Results  Component Value Date   WBC 7.9 09/01/2019   HGB 13.6 09/01/2019   HCT 41.7 09/01/2019   MCV 90 09/01/2019   PLT 309 09/01/2019   Lab Results  Component Value Date   NA 141 06/13/2020   K 4.0 06/13/2020   CO2 23 06/13/2020   GLUCOSE 120 (H) 06/13/2020   BUN 12 06/13/2020   CREATININE 0.70 06/13/2020   BILITOT 0.5 09/01/2019   ALKPHOS 92 09/01/2019   AST 17 09/01/2019   ALT 21 09/01/2019   PROT 6.6 09/01/2019   ALBUMIN 4.1 09/01/2019   CALCIUM 8.8 06/13/2020   Lab Results  Component Value Date   CHOL 165 09/01/2019   Lab Results  Component Value Date   HDL 47 09/01/2019   Lab Results  Component Value Date   LDLCALC 92 09/01/2019   Lab Results  Component Value Date   TRIG 151 (H) 09/01/2019   No results found for: [facility] Lab Results  Component Value Date   HGBA1C 5.5 09/01/2019       Assessment & Plan:  1. Acute right-sided low back pain with right-sided sciatica - DG Lumbar Spine Complete - cyclobenzaprine (FLEXERIL) 10 MG tablet; Take 1 tablet (10 mg total) by mouth 3 (  three) times daily as needed for muscle spasms.  Dispense: 30 tablet; Refill: 1 - predniSONE (DELTASONE) 20 MG tablet; Take 3 tablets (60 mg total) by mouth daily with breakfast for 3 days, THEN 2 tablets (40 mg total) daily with breakfast for 3 days, THEN 1 tablet (20 mg total) daily with breakfast for 3 days.  Dispense: 18 tablet; Refill: 0    Meds ordered this encounter  Medications  . cyclobenzaprine  (FLEXERIL) 10 MG tablet    Sig: Take 1 tablet (10 mg total) by mouth 3 (three) times daily as needed for muscle spasms.    Dispense:  30 tablet    Refill:  1    Order Specific Question:   Supervising Provider    AnswerRochel Brome [id number]  . predniSONE (DELTASONE) 20 MG tablet    Sig: Take 3 tablets (60 mg total) by mouth daily with breakfast for 3 days, THEN 2 tablets (40 mg total) daily with breakfast for 3 days, THEN 1 tablet (20 mg total) daily with breakfast for 3 days.    Dispense:  18 tablet    Refill:  0    Order Specific Question:   Supervising Provider    AnswerShelton Silvas    Orders Placed This Encounter  Procedures  . DG Lumbar Spine Complete    Follow up if symptoms persist/worsen - consider ortho referral  Follow-up: Return for recommend to make appt for physical.  An After Visit Summary was printed and given to the patient.  Yetta Flock [facility] [phone]

## 2020-11-25 LAB — BASIC METABOLIC PANEL
BUN/Creatinine Ratio: 20 (ref 12–28)
BUN: 19 mg/dL (ref 8–27)
CO2: 24 mmol/L (ref 20–29)
Calcium: 8.7 mg/dL (ref 8.7–10.3)
Chloride: 101 mmol/L (ref 96–106)
Creatinine, Ser: 0.97 mg/dL (ref 0.57–1.00)
Glucose: 83 mg/dL (ref 65–99)
Potassium: 4.4 mmol/L (ref 3.5–5.2)
Sodium: 142 mmol/L (ref 134–144)
eGFR: 65 mL/min/{1.73_m2} (ref >59)

## 2020-11-25 LAB — CBC
Hematocrit: 45.6 % (ref 34.0–46.6)
Hemoglobin: 14.5 g/dL (ref 11.1–15.9)
MCH: 28.9 pg (ref 26.6–33.0)
MCHC: 31.8 g/dL (ref 31.5–35.7)
MCV: 91 fL (ref 79–97)
Platelets: 318 10*3/uL (ref 150–450)
RBC: 5.02 x10E6/uL (ref 3.77–5.28)
RDW: 13 % (ref 11.7–15.4)
WBC: 14.9 10*3/uL — ABNORMAL HIGH (ref 3.4–10.8)

## 2020-11-28 ENCOUNTER — Other Ambulatory Visit: Payer: Self-pay | Admitting: Physician Assistant

## 2020-11-28 DIAGNOSIS — R899 Unspecified abnormal finding in specimens from other organs, systems and tissues: Secondary | ICD-10-CM

## 2020-12-05 ENCOUNTER — Institutional Professional Consult (permissible substitution): Payer: 59 | Admitting: Cardiology

## 2020-12-05 ENCOUNTER — Other Ambulatory Visit (HOSPITAL_COMMUNITY)
Admission: RE | Admit: 2020-12-05 | Discharge: 2020-12-05 | Disposition: A | Discharge status: Home / Self Care | Payer: 59 | Source: Ambulatory Visit | Attending: Cardiology | Admitting: Cardiology

## 2020-12-05 DIAGNOSIS — Z01812 Encounter for preprocedural laboratory examination: Secondary | ICD-10-CM | POA: Insufficient documentation

## 2020-12-05 DIAGNOSIS — Z20822 Contact with and (suspected) exposure to covid-19: Secondary | ICD-10-CM | POA: Insufficient documentation

## 2020-12-05 LAB — SARS CORONAVIRUS 2 (TAT 6-24 HRS): SARS Coronavirus 2: NEGATIVE

## 2020-12-05 NOTE — Pre-Procedure Instructions (Signed)
Instructed patient on the following items: °Arrival time 0730 °Nothing to eat or drink after midnight °No meds AM of procedure °Responsible person to drive you home and stay with you for 24 hrs ° °Have you missed any doses of anti-coagulant Eliquis- hasn't missed any doses °   °

## 2020-12-06 ENCOUNTER — Encounter (HOSPITAL_COMMUNITY)
Admission: RE | Disposition: A | Discharge status: Home / Self Care | Payer: Self-pay | Source: Home / Self Care | Attending: Cardiology

## 2020-12-06 ENCOUNTER — Other Ambulatory Visit: Payer: Self-pay

## 2020-12-06 ENCOUNTER — Ambulatory Visit (HOSPITAL_COMMUNITY)
Admission: RE | Admit: 2020-12-06 | Discharge: 2020-12-06 | Disposition: A | Discharge status: Home / Self Care | Payer: 59 | Attending: Cardiology | Admitting: Cardiology

## 2020-12-06 ENCOUNTER — Ambulatory Visit (HOSPITAL_COMMUNITY): Payer: 59 | Admitting: Anesthesiology

## 2020-12-06 DIAGNOSIS — I10 Essential (primary) hypertension: Secondary | ICD-10-CM | POA: Insufficient documentation

## 2020-12-06 DIAGNOSIS — I471 Supraventricular tachycardia: Secondary | ICD-10-CM | POA: Diagnosis not present

## 2020-12-06 DIAGNOSIS — Z885 Allergy status to narcotic agent status: Secondary | ICD-10-CM | POA: Insufficient documentation

## 2020-12-06 DIAGNOSIS — G4733 Obstructive sleep apnea (adult) (pediatric): Secondary | ICD-10-CM | POA: Insufficient documentation

## 2020-12-06 DIAGNOSIS — Z882 Allergy status to sulfonamides status: Secondary | ICD-10-CM | POA: Diagnosis not present

## 2020-12-06 DIAGNOSIS — I480 Paroxysmal atrial fibrillation: Secondary | ICD-10-CM | POA: Insufficient documentation

## 2020-12-06 DIAGNOSIS — Z881 Allergy status to other antibiotic agents status: Secondary | ICD-10-CM | POA: Diagnosis not present

## 2020-12-06 HISTORY — PX: SVT ABLATION: EP1225

## 2020-12-06 SURGERY — SVT ABLATION
Anesthesia: Monitor Anesthesia Care

## 2020-12-06 MED ORDER — ISOPROTERENOL HCL 0.2 MG/ML IJ SOLN
INTRAMUSCULAR | Status: AC
  Filled 2020-12-06: qty 5

## 2020-12-06 MED ORDER — SODIUM CHLORIDE 0.9% FLUSH: 3.0000 mL | Freq: Two times a day (BID) | INTRAVENOUS | Status: DC

## 2020-12-06 MED ORDER — METOPROLOL SUCCINATE ER 25 MG PO TB24
25.0000 mg | ORAL_TABLET | Freq: Once | ORAL | Status: AC
  Administered 2020-12-06: 25 mg via ORAL
  Filled 2020-12-06: qty 1

## 2020-12-06 MED ORDER — SODIUM CHLORIDE 0.9 % IV SOLN: INTRAVENOUS | Status: DC

## 2020-12-06 MED ORDER — SODIUM CHLORIDE 0.9 % IV SOLN: INTRAVENOUS | Status: DC | PRN

## 2020-12-06 MED ORDER — ONDANSETRON HCL 4 MG/2ML IJ SOLN: 4.0000 mg | Freq: Four times a day (QID) | INTRAMUSCULAR | Status: DC | PRN

## 2020-12-06 MED ORDER — SODIUM CHLORIDE 0.9% FLUSH: 3.0000 mL | INTRAVENOUS | Status: DC | PRN

## 2020-12-06 MED ORDER — PROPOFOL 500 MG/50ML IV EMUL
INTRAVENOUS | Status: DC | PRN
  Administered 2020-12-06: 75 ug/kg/min via INTRAVENOUS

## 2020-12-06 MED ORDER — LIDOCAINE HCL (PF) 1 % IJ SOLN: INTRAMUSCULAR | Status: DC | PRN

## 2020-12-06 MED ORDER — BUPIVACAINE HCL (PF) 0.25 % IJ SOLN
INTRAMUSCULAR | Status: AC
  Filled 2020-12-06: qty 60

## 2020-12-06 MED ORDER — HEPARIN (PORCINE) IN NACL 1000-0.9 UT/500ML-% IV SOLN
INTRAVENOUS | Status: AC
  Filled 2020-12-06: qty 500

## 2020-12-06 MED ORDER — SODIUM CHLORIDE 0.9 % IV SOLN: 250.0000 mL | INTRAVENOUS | Status: DC | PRN

## 2020-12-06 MED ORDER — FENTANYL CITRATE (PF) 100 MCG/2ML IJ SOLN
INTRAMUSCULAR | Status: DC | PRN
  Administered 2020-12-06: 25 ug via INTRAVENOUS
  Administered 2020-12-06: 50 ug via INTRAVENOUS
  Administered 2020-12-06: 25 ug via INTRAVENOUS

## 2020-12-06 MED ORDER — HEPARIN (PORCINE) IN NACL 1000-0.9 UT/500ML-% IV SOLN
INTRAVENOUS | Status: DC | PRN
  Administered 2020-12-06 (×2): 500 mL

## 2020-12-06 MED ORDER — BUPIVACAINE HCL (PF) 0.25 % IJ SOLN
INTRAMUSCULAR | Status: DC | PRN
  Administered 2020-12-06: 45 mL

## 2020-12-06 MED ORDER — ISOPROTERENOL HCL 0.2 MG/ML IJ SOLN
INTRAVENOUS | Status: DC | PRN
  Administered 2020-12-06: 2 ug/min via INTRAVENOUS

## 2020-12-06 MED ORDER — PROPOFOL 10 MG/ML IV BOLUS
INTRAVENOUS | Status: DC | PRN
  Administered 2020-12-06 (×3): 20 mg via INTRAVENOUS

## 2020-12-06 MED ORDER — ACETAMINOPHEN 325 MG PO TABS
650.0000 mg | ORAL_TABLET | ORAL | Status: DC | PRN
  Filled 2020-12-06: qty 2

## 2020-12-06 MED ORDER — MIDAZOLAM HCL 5 MG/5ML IJ SOLN
INTRAMUSCULAR | Status: DC | PRN
  Administered 2020-12-06 (×2): 1 mg via INTRAVENOUS

## 2020-12-06 SURGICAL SUPPLY — 13 items
BAG SNAP BAND KOVER 36X36 (MISCELLANEOUS) ×2 IMPLANT
CATH EZ STEER NAV 4MM D-F CUR (ABLATOR) ×2 IMPLANT
CATH JOSEPH QUAD ALLRED 6F REP (CATHETERS) ×4 IMPLANT
CATH WEBSTER BI DIR CS D-F CRV (CATHETERS) ×2 IMPLANT
CLOSURE PERCLOSE PROSTYLE (VASCULAR PRODUCTS) ×8 IMPLANT
PACK EP LATEX FREE (CUSTOM PROCEDURE TRAY) ×2
PACK EP LF (CUSTOM PROCEDURE TRAY) ×1 IMPLANT
PAD PRO RADIOLUCENT 2001M-C (PAD) ×2 IMPLANT
PATCH CARTO3 (PAD) ×2 IMPLANT
SHEATH PINNACLE 6F 10CM (SHEATH) ×4 IMPLANT
SHEATH PINNACLE 7F 10CM (SHEATH) ×4 IMPLANT
SHEATH PINNACLE 8F 10CM (SHEATH) ×2 IMPLANT
SHEATH PROBE COVER 6X72 (BAG) ×2 IMPLANT

## 2020-12-06 NOTE — H&P (Signed)
Electrophysiology Office Note   Date:  12/06/2020   ID:  Rebecca Rojas, DOB 1956/07/12, MRN [id number]  PCP:  Marianne Sofia, PA-C  Cardiologist:  Dulce Sellar Primary Electrophysiologist:  Marisel Tostenson Jorja Loa, MD    Chief Complaint: AF, SVT   History of Present Illness: Rebecca Rojas is a 65 y.o. adult who is being seen today for the evaluation of AF, SVT at the request of No ref. provider found. Presenting today for electrophysiology evaluation.  She has a history significant for paroxysmal atrial fibrillation, obstructive sleep apnea, hypertension.  She was admitted to [facility] with an episode of SVT.  SVT degenerated into atrial fibrillation.  She states that she converted back to sinus rhythm on her own after being on diltiazem.  She had a episode prior to this in October with similar symptoms.  She states that her heart rate got quite a bit faster during that episode.  She has weakness, fatigue, shortness of breath and some mild chest pain when her episodes occur.  Today, denies symptoms of palpitations, chest pain, shortness of breath, orthopnea, PND, lower extremity edema, claudication, dizziness, presyncope, syncope, bleeding, or neurologic sequela. The patient is tolerating medications without difficulties. Plan for SVT ablation today.    Past Medical History:  Diagnosis Date  . Acute laryngopharyngitis 01/13/2020  . Acute non-recurrent frontal sinusitis 09/22/2017  . Allergy    dust, environmental, feathers, has epipen, prn  . Allergy-induced asthma   . Anemia   . Arthritis    osteoarthritis  . Arthritis of right acromioclavicular joint 06/15/2019  . Asthma 12/28/2015  . Asthma with exacerbation 04/27/2020  . Atrial fibrillation with RVR ([facility]) 05/20/2020  . Back pain   . Benign essential hypertension 12/28/2015  . Bilateral lower extremity edema 08/04/2018  . Encounter for screening colonoscopy 01/08/2017  . Glenoid labral tear, right, initial encounter 06/15/2019  .  Headache(784.0)    occasional migraine  . Healthcare maintenance 01/08/2017  . High risk medication use 12/28/2015  . History of seasonal allergies   . Hordeolum internum of right lower eyelid 02/14/2017  . Insulin resistance 10/01/2019  . Joint pain   . Lower extremity edema   . Lower respiratory infection (e.g., bronchitis, pneumonia, pneumonitis, pulmonitis) 05/10/2009   Qualifier: Diagnosis of  By: Shelle Iron MD, Maree Krabbe   . Migraine headache 12/28/2015  . Mixed hyperlipidemia 12/28/2015  . Morbid obesity ([facility])   . Myalgia 12/28/2015  . Osteoarthritis   . Pain of right eye 02/14/2017  . Pain of right foot 08/30/2016  . Persistent cough for 3 weeks or longer 10/11/2017  . Pharyngitis 05/23/2018  . Rotator cuff tear, right 06/15/2019  . Shortness of breath    maybe with exertion  . Skin lesion of foot 08/30/2016  . Sleep apnea    uses cpap  . Sore throat 05/11/2018  . Unspecified open wound, left lower leg, initial encounter 08/04/2018  . Vertigo   . Vitamin D deficiency    Past Surgical History:  Procedure Laterality Date  . ABDOMINAL HYSTERECTOMY    . COLONOSCOPY  09/2010   normal  . FRACTURE SURGERY     left 5th finger fx with bone graft repair  . HEEL SPUR EXCISION    . JOINT REPLACEMENT    . KNEE ARTHROSCOPY Left   . Left Knee lateral release    . LIPOMA EXCISION    . SHOULDER ARTHROSCOPY WITH ROTATOR CUFF REPAIR AND SUBACROMIAL DECOMPRESSION Right 06/15/2019   Procedure: SHOULDER ARTHROSCOPY WITH  ROTATOR CUFF REPAIR AND SUBACROMIAL DECOMPRESSION;  Surgeon: Jodi Geralds, MD;  Location: [facility];  Service: Orthopedics;  Laterality: Right;  . TONSILLECTOMY    . TOTAL KNEE ARTHROPLASTY  08/03/2011   Procedure: TOTAL KNEE ARTHROPLASTY;  Surgeon: Harvie Junior;  Location: [facility];  Service: Orthopedics;  Laterality: Right;  COMPUTER ASSISTED TOTAL KNEE REPLACEMENTwith revision tibial component  . TUBAL LIGATION       No current facility-administered medications for this encounter.     Allergies:   Sulfa antibiotics, Cefdinir, Erythromycin, and Hydrocodone-homatropine   Social History:  The patient  reports that she has never smoked. She has never used smokeless tobacco. She reports previous alcohol use. She reports that she does not use drugs.   Family History:  The patient's family history includes Cancer in her father; Heart attack in her mother; Heart disease in her mother; Hyperlipidemia in her mother; Hypertension in her mother; Liver disease in her father; Obesity in her father and mother; Stroke in her mother.   ROS:  Please see the history of present illness.   Otherwise, review of systems is positive for none.   All other systems are reviewed and negative.   PHYSICAL EXAM: VS:  There were no vitals taken for this visit. , BMI There is no height or weight on file to calculate BMI. GEN: Well nourished, well developed, in no acute distress  HEENT: normal  Neck: no JVD, carotid bruits, or masses Cardiac: RRR; no murmurs, rubs, or gallops,no edema  Respiratory:  clear to auscultation bilaterally, normal work of breathing GI: soft, nontender, nondistended, + BS MS: no deformity or atrophy  Skin: warm and dry Neuro:  Strength and sensation are intact Psych: euthymic mood, full affect   Recent Labs: 11/24/2020: BUN 19; Creatinine, Ser 0.97; Hemoglobin 14.5; Platelets 318; Potassium 4.4; Sodium 142    Lipid Panel     Component Value Date/Time   CHOL 165 09/01/2019 1226   TRIG 151 (H) 09/01/2019 1226   HDL 47 09/01/2019 1226   LDLCALC 92 09/01/2019 1226     Wt Readings from Last 3 Encounters:  11/15/20 119.9 kg  10/20/20 119.7 kg  10/19/20 119.7 kg      Other studies Reviewed: Additional studies/ records that were reviewed today include: TTE 05/23/2020 Review of the above records today demonstrates:  . LeftVentricle: Wall thickness is normal.  . LeftVentricle: Wall motion is normal.  . LeftVentricle: Doppler parameters consistent with mild  diastolic  dysfunction and low to normal LA pressure.  . LeftVentricle: Systolic function is normal. EF: 55-60%.  . LeftAtrium: Left atrium is mildly dilated.  . No significant valvular abnormality.    ASSESSMENT AND PLAN:  1.  SVT: Rebecca Rojas has presented today for surgery, with the diagnosis of svt.  The various methods of treatment have been discussed with the patient and family. After consideration of risks, benefits and other options for treatment, the patient has consented to  Procedure(s): Catheter ablation as a surgical intervention .  Risks include but not limited to complete heart block, stroke, esophageal damage, nerve damage, bleeding, vascular damage, tamponade, perforation, MI, and death. The patient's history has been reviewed, patient examined, no change in status, stable for surgery.  I have reviewed the patient's chart and labs.  Questions were answered to the patient's satisfaction.    Jahaan Vanwagner Elberta Fortis, MD 12/06/2020 7:42 AM

## 2020-12-06 NOTE — Discharge Instructions (Signed)
Cardiac Ablation, Care After  This sheet gives you information about how to care for yourself after your procedure. Your health care provider may also give you more specific instructions. If you have problems or questions, contact your health care provider. What can I expect after the procedure? After the procedure, it is common to have:  Bruising around your puncture site.  Tenderness around your puncture site.  Skipped heartbeats.  Tiredness (fatigue).  Follow these instructions at home: Puncture site care   Follow instructions from your health care provider about how to take care of your puncture site. Make sure you: ? If present, leave stitches (sutures), skin glue, or adhesive strips in place. These skin closures may need to stay in place for up to 2 weeks. If adhesive strip edges start to loosen and curl up, you may trim the loose edges. Do not remove adhesive strips completely unless your health care provider tells you to do that. ? If a square bandage is present, this may be removed in 24 hours.   Check your puncture site every day for signs of infection. Check for: ? Redness, swelling, or pain. ? Fluid or blood. If your puncture site starts to bleed, lie down on your back, apply firm pressure to the area, and contact your health care provider. ? Warmth. ? Pus or a bad smell. Driving  Do not drive for at least 4 days after your procedure or however long your health care provider recommends. (Do not resume driving if you have previously been instructed not to drive for other health reasons.)  Do not drive or use heavy machinery while taking prescription pain medicine. Activity  Avoid activities that take a lot of effort for at least 7 days after your procedure.  Do not lift anything that is heavier than 5 lb (4.5 kg) for one week.   No sexual activity for 1 week.   Return to your normal activities as told by your health care provider. Ask your health care provider what  activities are safe for you. General instructions  Take over-the-counter and prescription medicines only as told by your health care provider.  Do not use any products that contain nicotine or tobacco, such as cigarettes and e-cigarettes. If you need help quitting, ask your health care provider.  You may shower after 24 hours, but Do not take baths, swim, or use a hot tub for 1 week.   Do not drink alcohol for 24 hours after your procedure.  Keep all follow-up visits as told by your health care provider. This is important. Contact a health care provider if:  You have redness, mild swelling, or pain around your puncture site.  You have fluid or blood coming from your puncture site that stops after applying firm pressure to the area.  Your puncture site feels warm to the touch.  You have pus or a bad smell coming from your puncture site.  You have a fever.  You have chest pain or discomfort that spreads to your neck, jaw, or arm.  You are sweating a lot.  You feel nauseous.  You have a fast or irregular heartbeat.  You have shortness of breath.  You are dizzy or light-headed and feel the need to lie down.  You have pain or numbness in the arm or leg closest to your puncture site. Get help right away if:  Your puncture site suddenly swells.  Your puncture site is bleeding and the bleeding does not stop after applying firm   pressure to the area. These symptoms may represent a serious problem that is an emergency. Do not wait to see if the symptoms will go away. Get medical help right away. Call your local emergency services (911 in the [place] Do not drive yourself to the hospital. Summary  After the procedure, it is normal to have bruising and tenderness at the puncture site in your groin, neck, or forearm.  Check your puncture site every day for signs of infection.  Get help right away if your puncture site is bleeding and the bleeding does not stop after applying firm  pressure to the area. This is a medical emergency. This information is not intended to replace advice given to you by your health care provider. Make sure you discuss any questions you have with your health care provider.    

## 2020-12-06 NOTE — Anesthesia Postprocedure Evaluation (Signed)
Anesthesia Post Note  Patient: Rebecca Rojas  Procedure(s) Performed: SVT ABLATION (N/A )     Patient location during evaluation: Cath Lab Anesthesia Type: MAC Level of consciousness: awake and alert Pain management: pain level controlled Vital Signs Assessment: post-procedure vital signs reviewed and stable Respiratory status: spontaneous breathing, nonlabored ventilation, respiratory function stable and patient connected to nasal cannula oxygen Cardiovascular status: stable and blood pressure returned to baseline Postop Assessment: no apparent nausea or vomiting Anesthetic complications: no   No complications documented.  Last Vitals:  Vitals:   12/06/20 1413 12/06/20 1428  BP: (!) 165/69 (!) 166/71  Pulse: 86 89  Resp: 19 (!) 27  Temp:    SpO2: 100% 99%    Last Pain:  Vitals:   12/06/20 1353  TempSrc:   PainSc: 0-No pain                 Catalina Gravel

## 2020-12-06 NOTE — Transfer of Care (Signed)
Immediate Anesthesia Transfer of Care Note  Patient: Rebecca Rojas  Procedure(s) Performed: SVT ABLATION (N/A )  Patient Location: Cath Lab  Anesthesia Type:MAC  Level of Consciousness: awake, alert  and oriented  Airway & Oxygen Therapy: Patient Spontanous Breathing and Patient connected to nasal cannula oxygen  Post-op Assessment: Report given to RN, Post -op Vital signs reviewed and stable and Patient moving all extremities X 4  Post vital signs: Reviewed and stable  Last Vitals:  Vitals Value Taken Time  BP 122/79 12/06/20 1320  Temp 36.1 C 12/06/20 1317  Pulse 77 12/06/20 1320  Resp 16 12/06/20 1320  SpO2 100 % 12/06/20 1320  Vitals shown include unvalidated device data.  Last Pain:  Vitals:   12/06/20 1317  TempSrc: Temporal  PainSc: 0-No pain      Patients Stated Pain Goal: 3 (00/34/91 7915)  Complications: No complications documented.

## 2020-12-06 NOTE — Anesthesia Preprocedure Evaluation (Addendum)
Anesthesia Evaluation  Patient identified by MRN, date of birth, ID band Patient awake    Reviewed: Allergy & Precautions, NPO status , Patient's Chart, lab work & pertinent test results, reviewed documented beta blocker date and time   History of Anesthesia Complications Negative for: history of anesthetic complications  Airway Mallampati: II  TM Distance: >3 FB Neck ROM: Full    Dental  (+) Dental Advisory Given   Pulmonary asthma , sleep apnea and Continuous Positive Airway Pressure Ventilation ,    Pulmonary exam normal        Cardiovascular hypertension, Pt. on home beta blockers and Pt. on medications Normal cardiovascular exam+ dysrhythmias Atrial Fibrillation and Supra Ventricular Tachycardia    '21 TTE (Care Everywhere) - mild diastolic dysfunction. EF 55-60%. Left atrium is mildly dilated.     Neuro/Psych  Headaches,  Vertigo  negative psych ROS   GI/Hepatic negative GI ROS, Neg liver ROS,   Endo/Other  Morbid obesity  Renal/GU negative Renal ROS     Musculoskeletal  (+) Arthritis ,   Abdominal   Peds  Hematology  On eliquis    Anesthesia Other Findings Covid test negative   Reproductive/Obstetrics                            Anesthesia Physical Anesthesia Plan  ASA: III  Anesthesia Plan: MAC   Post-op Pain Management:    Induction: Intravenous  PONV Risk Score and Plan: 2 and Propofol infusion and Treatment may vary due to age or medical condition  Airway Management Planned: Natural Airway and Simple Face Mask  Additional Equipment: None  Intra-op Plan:   Post-operative Plan:   Informed Consent: I have reviewed the patients History and Physical, chart, labs and discussed the procedure including the risks, benefits and alternatives for the proposed anesthesia with the patient or authorized representative who has indicated his/her understanding and acceptance.        Plan Discussed with: CRNA and Anesthesiologist  Anesthesia Plan Comments:        Anesthesia Quick Evaluation

## 2020-12-07 ENCOUNTER — Encounter (HOSPITAL_COMMUNITY): Payer: Self-pay | Admitting: Cardiology

## 2020-12-28 ENCOUNTER — Telehealth: Payer: Self-pay | Admitting: Cardiology

## 2020-12-28 ENCOUNTER — Other Ambulatory Visit: Payer: 59

## 2020-12-28 DIAGNOSIS — R899 Unspecified abnormal finding in specimens from other organs, systems and tissues: Secondary | ICD-10-CM

## 2020-12-28 DIAGNOSIS — R040 Epistaxis: Secondary | ICD-10-CM

## 2020-12-28 LAB — CBC WITH DIFFERENTIAL/PLATELET
Basophils Absolute: 0.1 10*3/uL (ref 0.0–0.2)
Basos: 1 %
EOS (ABSOLUTE): 0.3 10*3/uL (ref 0.0–0.4)
Eos: 3 %
Hematocrit: 40 % (ref 34.0–46.6)
Hemoglobin: 13 g/dL (ref 11.1–15.9)
Immature Grans (Abs): 0 10*3/uL (ref 0.0–0.1)
Immature Granulocytes: 0 %
Lymphocytes Absolute: 4.1 10*3/uL — ABNORMAL HIGH (ref 0.7–3.1)
Lymphs: 46 %
MCH: 29 pg (ref 26.6–33.0)
MCHC: 32.5 g/dL (ref 31.5–35.7)
MCV: 89 fL (ref 79–97)
Monocytes Absolute: 0.7 10*3/uL (ref 0.1–0.9)
Monocytes: 8 %
Neutrophils Absolute: 3.7 10*3/uL (ref 1.4–7.0)
Neutrophils: 42 %
Platelets: 303 10*3/uL (ref 150–450)
RBC: 4.48 x10E6/uL (ref 3.77–5.28)
RDW: 13 % (ref 11.7–15.4)
WBC: 8.9 10*3/uL (ref 3.4–10.8)

## 2020-12-28 NOTE — Telephone Encounter (Signed)
Patient's husband states the patient has been having frequent nose bleeds for the past few weeks after having her ablation. The nose bleeds usually occur once every third day, but today she had 3 nose bleeds within 6 hours. He states right now the blood is coming out of both nostrils. He is requesting advisement from Dr. Dulce Sellar. Patient is on Eliquis.

## 2020-12-29 NOTE — Telephone Encounter (Signed)
This is complicated, ideally would not want her off her anticoagulant for the first 90 days after ablation  I think she should be seen by ENT or the ED as quickly as possible and is generally there is a probably inside the nose and either needs to have cauterization or packing performed to control bleeding.  Please be sure she is not taking aspirin along with Eliquis.

## 2020-12-29 NOTE — Telephone Encounter (Signed)
Spoke to the patient just now and let her know Dr. Hulen Shouts recommendations. She verbalizes understanding and thanks me for the call back.   She is not taking Aspirin

## 2020-12-30 ENCOUNTER — Other Ambulatory Visit: Payer: Self-pay | Admitting: Physician Assistant

## 2020-12-30 DIAGNOSIS — E559 Vitamin D deficiency, unspecified: Secondary | ICD-10-CM

## 2020-12-30 DIAGNOSIS — E782 Mixed hyperlipidemia: Secondary | ICD-10-CM

## 2020-12-30 DIAGNOSIS — I10 Essential (primary) hypertension: Secondary | ICD-10-CM

## 2020-12-30 DIAGNOSIS — E8881 Metabolic syndrome: Secondary | ICD-10-CM

## 2021-01-01 NOTE — Progress Notes (Signed)
Electrophysiology Office Note   Date:  01/02/2021   ID:  Rebecca Rojas, DOB 05/15/1956, MRN [id number]  PCP:  Rochel Brome, MD  Cardiologist:  Bettina Gavia Primary Electrophysiologist:  Frenchie Pribyl Meredith Leeds, MD    Chief Complaint: AF, SVT   History of Present Illness: Rebecca Rojas is a 65 y.o. adult who is being seen today for the evaluation of AF, SVT at the request of Marge Duncans, [place]. Presenting today for electrophysiology evaluation.  She has a history significant for paroxysmal atrial fibrillation, obstructive sleep apnea, hypertension, and SVT.  She was admitted to [facility] with an episode of SVT.  SVT degenerated into atrial fibrillation.  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  Today, denies symptoms of palpitations, chest pain, shortness of breath, orthopnea, PND, lower extremity edema, claudication, dizziness, presyncope, syncope, bleeding, or neurologic sequela. The patient is tolerating medications without difficulties.  Since being seen she has done well.  She has noted no further episodes of SVT or atrial fibrillation.  She states that she can feel her heart beating, but is not beating fast regularly.  She is able to do all of her daily activities.  She has been having some nosebleeds and has follow-up scheduled with ENT.   Past Medical History:  Diagnosis Date  . Acute laryngopharyngitis 01/13/2020  . Acute non-recurrent frontal sinusitis 09/22/2017  . Allergy    dust, environmental, feathers, has epipen, prn  . Allergy-induced asthma   . Anemia   . Arthritis    osteoarthritis  . Arthritis of right acromioclavicular joint 06/15/2019  . Asthma 12/28/2015  . Asthma with exacerbation 04/27/2020  . Atrial fibrillation with RVR ([place]) 05/20/2020  . Back pain   . Benign essential hypertension 12/28/2015  . Bilateral lower extremity edema 08/04/2018  . Encounter for screening colonoscopy 01/08/2017  . Glenoid labral tear, right, initial encounter  06/15/2019  . Headache(784.0)    occasional migraine  . Healthcare maintenance 01/08/2017  . High risk medication use 12/28/2015  . History of seasonal allergies   . Hordeolum internum of right lower eyelid 02/14/2017  . Insulin resistance 10/01/2019  . Joint pain   . Lower extremity edema   . Lower respiratory infection (e.g., bronchitis, pneumonia, pneumonitis, pulmonitis) 05/10/2009   Qualifier: Diagnosis of  By: Gwenette Greet MD, Armando Reichert   . Migraine headache 12/28/2015  . Mixed hyperlipidemia 12/28/2015  . Morbid obesity ([place])   . Myalgia 12/28/2015  . Osteoarthritis   . Pain of right eye 02/14/2017  . Pain of right foot 08/30/2016  . Persistent cough for 3 weeks or longer 10/11/2017  . Pharyngitis 05/23/2018  . Rotator cuff tear, right 06/15/2019  . Shortness of breath    maybe with exertion  . Skin lesion of foot 08/30/2016  . Sleep apnea    uses cpap  . Sore throat 05/11/2018  . Unspecified open wound, left lower leg, initial encounter 08/04/2018  . Vertigo   . Vitamin D deficiency    Past Surgical History:  Procedure Laterality Date  . ABDOMINAL HYSTERECTOMY    . COLONOSCOPY  09/2010   normal  . FRACTURE SURGERY     left 5th finger fx with bone graft repair  . HEEL SPUR EXCISION    . JOINT REPLACEMENT    . KNEE ARTHROSCOPY Left   . Left Knee lateral release    . LIPOMA EXCISION    . SHOULDER ARTHROSCOPY WITH ROTATOR CUFF REPAIR AND SUBACROMIAL DECOMPRESSION Right 06/15/2019  Procedure: SHOULDER ARTHROSCOPY WITH ROTATOR CUFF REPAIR AND SUBACROMIAL DECOMPRESSION;  Surgeon: Dorna Leitz, MD;  Location: [facility];  Service: Orthopedics;  Laterality: Right;  . SVT ABLATION N/A 12/06/2020   Procedure: SVT ABLATION;  Surgeon: Constance Haw, MD;  Location: [place] [facility];  Service: Cardiovascular;  Laterality: N/A;  . TONSILLECTOMY    . TOTAL KNEE ARTHROPLASTY  08/03/2011   Procedure: TOTAL KNEE ARTHROPLASTY;  Surgeon: Alta Corning;  Location: [place];  Service: Orthopedics;   Laterality: Right;  COMPUTER ASSISTED TOTAL KNEE REPLACEMENTwith revision tibial component  . TUBAL LIGATION       Current Outpatient Medications  Medication Sig Dispense Refill  . acetaminophen (TYLENOL) 500 MG tablet Take 1,000 mg by mouth every 6 (six) hours as needed for moderate pain.    Marland Kitchen albuterol (PROVENTIL HFA;VENTOLIN HFA) 108 (90 BASE) MCG/ACT inhaler Inhale 1-2 puffs into the lungs every 6 (six) hours as needed for wheezing or shortness of breath.     Marland Kitchen apixaban (ELIQUIS) 5 MG TABS tablet Take 1 tablet (5 mg total) by mouth 2 (two) times daily. 180 tablet 3  . azelastine (OPTIVAR) 0.05 % ophthalmic solution Place 1 drop into both eyes 2 (two) times daily as needed (allergies).    . cyclobenzaprine (FLEXERIL) 10 MG tablet Take 1 tablet (10 mg total) by mouth 3 (three) times daily as needed for muscle spasms. 30 tablet 1  . diltiazem (TIAZAC) 180 MG 24 hr capsule Take 1 capsule (180 mg total) by mouth daily. 90 capsule 3  . EPINEPHrine 0.3 mg/0.3 mL IJ SOAJ injection Inject 0.3 mg into the muscle as needed for anaphylaxis.    . fluticasone furoate-vilanterol (BREO ELLIPTA) 100-25 MCG/INH AEPB Inhale 1 puff into the lungs daily.    . furosemide (LASIX) 20 MG tablet Take 40 mg by mouth daily as needed (fluid retention.).     Marland Kitchen ipratropium (ATROVENT) 0.02 % nebulizer solution Take 3 mLs by nebulization every 6 (six) hours as needed for wheezing or shortness of breath.    . levocetirizine (XYZAL) 5 MG tablet Take 5 mg by mouth every evening.    . metoprolol succinate (TOPROL XL) 25 MG 24 hr tablet Take 1 tablet (25 mg total) by mouth daily. 90 tablet 3  . mometasone (NASONEX) 50 MCG/ACT nasal spray Place 2 sprays into the nose daily. (Patient taking differently: Place 2 sprays into the nose daily as needed (allergies).) 17 g 12  . montelukast (SINGULAIR) 10 MG tablet Take 10 mg by mouth at bedtime.    Marland Kitchen PRESCRIPTION MEDICATION Inject 1 kit into the skin every 7 (seven) days. Allergy shot  unknown to pt     No current facility-administered medications for this visit.    Allergies:   Sulfa antibiotics, Cefdinir, Erythromycin, and Hydrocodone bit-homatrop mbr   Social History:  The patient  reports that she has never smoked. She has never used smokeless tobacco. She reports previous alcohol use. She reports that she does not use drugs.   Family History:  The patient's family history includes Cancer in her father; Heart attack in her mother; Heart disease in her mother; Hyperlipidemia in her mother; Hypertension in her mother; Liver disease in her father; Obesity in her father and mother; Stroke in her mother.   ROS:  Please see the history of present illness.   Otherwise, review of systems is positive for none.   All other systems are reviewed and negative.   PHYSICAL EXAM: VS:  BP 122/80  Pulse 84   Ht 5' 1"  (1.549 m)   Wt 268 lb 12.8 oz (121.9 kg)   SpO2 97%   BMI 50.79 kg/m  , BMI Body mass index is 50.79 kg/m. GEN: Well nourished, well developed, in no acute distress  HEENT: normal  Neck: no JVD, carotid bruits, or masses Cardiac: RRR; no murmurs, rubs, or gallops,no edema  Respiratory:  clear to auscultation bilaterally, normal work of breathing GI: soft, nontender, nondistended, + BS MS: no deformity or atrophy  Skin: warm and dry Neuro:  Strength and sensation are intact Psych: euthymic mood, full affect  EKG:  EKG is ordered today. Personal review of the ekg ordered shows sinus rhythm, incomplete right bundle branch block, rate 84  Recent Labs: 11/24/2020: BUN 19; Creatinine, Ser 0.97; Potassium 4.4; Sodium 142 12/28/2020: Hemoglobin 13.0; Platelets 303    Lipid Panel     Component Value Date/Time   CHOL 165 09/01/2019 1226   TRIG 151 (H) 09/01/2019 1226   HDL 47 09/01/2019 1226   LDLCALC 92 09/01/2019 1226     Wt Readings from Last 3 Encounters:  01/02/21 268 lb 12.8 oz (121.9 kg)  12/06/20 260 lb (117.9 kg)  11/15/20 264 lb 6.4 oz (119.9 kg)       Other studies Reviewed: Additional studies/ records that were reviewed today include: TTE 05/23/2020 Review of the above records today demonstrates:  . LeftVentricle: Wall thickness is normal.  . LeftVentricle: Wall motion is normal.  . LeftVentricle: Doppler parameters consistent with mild diastolic  dysfunction and low to normal LA pressure.  . LeftVentricle: Systolic function is normal. EF: 55-60%.  . LeftAtrium: Left atrium is mildly dilated.  . No significant valvular abnormality.    ASSESSMENT AND PLAN:  1.  AVNRT: Occurred at [facility] with heart rates above 200 bpm.  She is now status post ablation 12/06/2020.  No further episodes of SVT.  Continue current management.  2.  Paroxysmal atrial fibrillation: CHA2DS2-VASc of 2.  On Eliquis, diltiazem, metoprolol.  It appears that SVT is a trigger for her atrial fibrillation.  She would like to stay on her Eliquis for now.  She has had nosebleeds and has follow-up with ENT.  We may stop it at the next visit.  3.  Obstructive sleep apnea: CPAP compliance encouraged   Current medicines are reviewed at length with the patient today.   The patient does not have concerns regarding her medicines.  The following changes were made today: None  Labs/ tests ordered today include:  Orders Placed This Encounter  Procedures  . EKG 12-Lead     Disposition:   FU with Avrohom Mckelvin 6 months  Signed, Aqeel Norgaard Meredith Leeds, MD  01/02/2021 11:31 AM     [facility] [address] [place] [address] [phone] (office) [phone] (fax)

## 2021-01-02 ENCOUNTER — Ambulatory Visit (INDEPENDENT_AMBULATORY_CARE_PROVIDER_SITE_OTHER): Payer: 59 | Admitting: Cardiology

## 2021-01-02 ENCOUNTER — Other Ambulatory Visit: Payer: Self-pay

## 2021-01-02 ENCOUNTER — Encounter: Payer: Self-pay | Admitting: Cardiology

## 2021-01-02 VITALS — BP 122/80 | HR 84 | Ht 61.0 in | Wt 268.8 lb

## 2021-01-02 DIAGNOSIS — I471 Supraventricular tachycardia: Secondary | ICD-10-CM

## 2021-01-22 NOTE — Progress Notes (Signed)
Cardiology Office Note:    Date:  01/23/2021   ID:  Rebecca Rojas, DOB [birth year], MRN [id number]  PCP:  Rochel Brome, MD  Cardiologist:  Shirlee More, MD    Referring MD: Marge Duncans, PA-C    ASSESSMENT:    1. SVT (supraventricular tachycardia) (HCC)   2. Paroxysmal atrial fibrillation ([place])   3. Hypertensive heart disease without heart failure   4. Bleeding from the nose    PLAN:    In order of problems listed above:  1. Stable after EP ablation for AV nodal reentrant tachycardia inducing atrial fibrillation she will continue her beta-blocker and her anticoagulant as discussed with the EP 2. BP at target continue current treatment calcium channel blocker beta-blocker 3. Referral to ENT continue her anticoagulant   Next appointment: 6 months   Medication Adjustments/Labs and Tests Ordered: Current medicines are reviewed at length with the patient today.  Concerns regarding medicines are outlined above.  Orders Placed This Encounter  Procedures  . Ambulatory referral to ENT   No orders of the defined types were placed in this encounter.   Chief Complaint  Patient presents with  . Follow-up    With SVT and atrial fibrillation    History of Present Illness:    Rebecca Rojas is a 65 y.o. adult with a hx of paroxysmal atrial fibrillation obstructive sleep apnea hypertensive heart disease with demand ischemia mild nonobstructive CAD last seen 10/19/2020.  She has a history of both SVT and paroxysmal atrial fibrillation and underwent ablation for AV nodal reentrant tachycardia 12/06/2020.  Compliance with diet, lifestyle and medications: Yes  Her plan is to continue anticoagulation for 10 more months before considering withdrawal as discussed with EP She has had no recurrent cardiovascular symptoms of palpitation chest pain shortness of breath or syncope. She has had epistasis and is awaiting ENT evaluation she is anticoagulated. Past Medical History:  Diagnosis  Date  . Acute laryngopharyngitis 01/13/2020  . Acute non-recurrent frontal sinusitis 09/22/2017  . Allergy    dust, environmental, feathers, has epipen, prn  . Allergy-induced asthma   . Anemia   . Arthritis    osteoarthritis  . Arthritis of right acromioclavicular joint 06/15/2019  . Asthma 12/28/2015  . Asthma with exacerbation 04/27/2020  . Atrial fibrillation with RVR ([place]) 05/20/2020  . Back pain   . Benign essential hypertension 12/28/2015  . Bilateral lower extremity edema 08/04/2018  . Encounter for screening colonoscopy 01/08/2017  . Glenoid labral tear, right, initial encounter 06/15/2019  . Headache(784.0)    occasional migraine  . Healthcare maintenance 01/08/2017  . High risk medication use 12/28/2015  . History of seasonal allergies   . Hordeolum internum of right lower eyelid 02/14/2017  . Insulin resistance 10/01/2019  . Joint pain   . Lower extremity edema   . Lower respiratory infection (e.g., bronchitis, pneumonia, pneumonitis, pulmonitis) 05/10/2009   Qualifier: Diagnosis of  By: Gwenette Greet MD, Armando Reichert   . Migraine headache 12/28/2015  . Mixed hyperlipidemia 12/28/2015  . Morbid obesity ([place])   . Myalgia 12/28/2015  . Osteoarthritis   . Pain of right eye 02/14/2017  . Pain of right foot 08/30/2016  . Persistent cough for 3 weeks or longer 10/11/2017  . Pharyngitis 05/23/2018  . Rotator cuff tear, right 06/15/2019  . Shortness of breath    maybe with exertion  . Skin lesion of foot 08/30/2016  . Sleep apnea    uses cpap  . Sore throat 05/11/2018  . Unspecified open  wound, left lower leg, initial encounter 08/04/2018  . Vertigo   . Vitamin D deficiency     Past Surgical History:  Procedure Laterality Date  . ABDOMINAL HYSTERECTOMY    . COLONOSCOPY  09/2010   normal  . FRACTURE SURGERY     left 5th finger fx with bone graft repair  . HEEL SPUR EXCISION    . JOINT REPLACEMENT    . KNEE ARTHROSCOPY Left   . Left Knee lateral release    . LIPOMA EXCISION    . SHOULDER  ARTHROSCOPY WITH ROTATOR CUFF REPAIR AND SUBACROMIAL DECOMPRESSION Right 06/15/2019   Procedure: SHOULDER ARTHROSCOPY WITH ROTATOR CUFF REPAIR AND SUBACROMIAL DECOMPRESSION;  Surgeon: Dorna Leitz, MD;  Location: [facility];  Service: Orthopedics;  Laterality: Right;  . SVT ABLATION N/A 12/06/2020   Procedure: SVT ABLATION;  Surgeon: Constance Haw, MD;  Location: [place] [facility];  Service: Cardiovascular;  Laterality: N/A;  . TONSILLECTOMY    . TOTAL KNEE ARTHROPLASTY  08/03/2011   Procedure: TOTAL KNEE ARTHROPLASTY;  Surgeon: Alta Corning;  Location: [place];  Service: Orthopedics;  Laterality: Right;  COMPUTER ASSISTED TOTAL KNEE REPLACEMENTwith revision tibial component  . TUBAL LIGATION      Current Medications: Current Meds  Medication Sig  . acetaminophen (TYLENOL) 500 MG tablet Take 1,000 mg by mouth every 6 (six) hours as needed for moderate pain.  Marland Kitchen albuterol (PROVENTIL HFA;VENTOLIN HFA) 108 (90 BASE) MCG/ACT inhaler Inhale 1-2 puffs into the lungs every 6 (six) hours as needed for wheezing or shortness of breath.   Marland Kitchen apixaban (ELIQUIS) 5 MG TABS tablet Take 1 tablet (5 mg total) by mouth 2 (two) times daily.  Marland Kitchen azelastine (OPTIVAR) 0.05 % ophthalmic solution Place 1 drop into both eyes 2 (two) times daily as needed (allergies).  . cyclobenzaprine (FLEXERIL) 10 MG tablet Take 1 tablet (10 mg total) by mouth 3 (three) times daily as needed for muscle spasms.  Marland Kitchen diltiazem (TIAZAC) 180 MG 24 hr capsule Take 1 capsule (180 mg total) by mouth daily.  Marland Kitchen EPINEPHrine 0.3 mg/0.3 mL IJ SOAJ injection Inject 0.3 mg into the muscle as needed for anaphylaxis.  . fluticasone furoate-vilanterol (BREO ELLIPTA) 100-25 MCG/INH AEPB Inhale 1 puff into the lungs daily.  . furosemide (LASIX) 20 MG tablet Take 40 mg by mouth daily as needed (fluid retention.).   Marland Kitchen ipratropium (ATROVENT) 0.02 % nebulizer solution Take 3 mLs by nebulization every 6 (six) hours as needed for wheezing or shortness of  breath.  . levocetirizine (XYZAL) 5 MG tablet Take 5 mg by mouth every evening.  . Magnesium 500 MG CAPS Take 500 mg by mouth daily.  . metoprolol succinate (TOPROL XL) 25 MG 24 hr tablet Take 1 tablet (25 mg total) by mouth daily.  . mometasone (NASONEX) 50 MCG/ACT nasal spray Place 2 sprays into the nose daily.  . montelukast (SINGULAIR) 10 MG tablet Take 10 mg by mouth at bedtime.  Marland Kitchen PRESCRIPTION MEDICATION Inject 1 kit into the skin every 7 (seven) days. Allergy shot unknown to pt     Allergies:   Sulfa antibiotics, Cefdinir, Erythromycin, and Hydrocodone bit-homatrop mbr   Social History   Socioeconomic History  . Marital status: Married    Spouse name: Fareedah Mahler  . Number of children: 2  . Years of education: Not on file  . Highest education level: Not on file  Occupational History  . 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 kits  Tobacco Use  . Smoking status: Never  Smoker  . Smokeless tobacco: Never Used  Vaping Use  . Vaping Use: Never used  Substance and Sexual Activity  . Alcohol use: Not Currently  . Drug use: Never  . Sexual activity: Yes  Other Topics Concern  . Not on file  Social History Narrative  . Not on file   Social Determinants of Health   Financial Resource Strain: Not on file  Food Insecurity: Not on file  Transportation Needs: Not on file  Physical Activity: Not on file  Stress: Not on file  Social Connections: Not on file     Family History: The patient's family history includes Cancer in her father; Heart attack in her mother; Heart disease in her mother; Hyperlipidemia in her mother; Hypertension in her mother; Liver disease in her father; Obesity in her father and mother; Stroke in her mother. There is no history of Anesthesia problems, Hypotension, Malignant hyperthermia, Pseudochol deficiency, Colon cancer, Colon polyps, Esophageal cancer, Rectal cancer, or Stomach cancer. ROS:   Please see the history of present illness.    All other  systems reviewed and are negative.  EKGs/Labs/Other Studies Reviewed:    The following studies were reviewed today:  EKG:  EKG 01/02/2021 showed sinus rhythm incomplete right bundle branch block  Recent Labs: 11/24/2020: BUN 19; Creatinine, Ser 0.97; Potassium 4.4; Sodium 142 12/28/2020: Hemoglobin 13.0; Platelets 303  Recent Lipid Panel    Component Value Date/Time   CHOL 165 09/01/2019 1226   TRIG 151 (H) 09/01/2019 1226   HDL 47 09/01/2019 1226   LDLCALC 92 09/01/2019 1226    Physical Exam:    VS:  BP 120/90 (BP Location: Right Arm, Patient Position: Sitting, Cuff Size: Normal)   Pulse 72   Ht 5' 1" (1.549 m)   Wt 272 lb (123.4 kg)   SpO2 97%   BMI 51.39 kg/m     Wt Readings from Last 3 Encounters:  01/23/21 272 lb (123.4 kg)  01/02/21 268 lb 12.8 oz (121.9 kg)  12/06/20 260 lb (117.9 kg)     GEN:  Well nourished, well developed in no acute distress HEENT: Normal NECK: No JVD; No carotid bruits LYMPHATICS: No lymphadenopathy CARDIAC: RRR, no murmurs, rubs, gallops RESPIRATORY:  Clear to auscultation without rales, wheezing or rhonchi  ABDOMEN: Soft, non-tender, non-distended MUSCULOSKELETAL:  No edema; No deformity  SKIN: Warm and dry NEUROLOGIC:  Alert and oriented x 3 PSYCHIATRIC:  Normal affect    Signed, Shirlee More, MD  01/23/2021 9:23 AM    [place]

## 2021-01-23 ENCOUNTER — Encounter: Payer: Self-pay | Admitting: Cardiology

## 2021-01-23 ENCOUNTER — Ambulatory Visit (INDEPENDENT_AMBULATORY_CARE_PROVIDER_SITE_OTHER): Payer: 59 | Admitting: Cardiology

## 2021-01-23 ENCOUNTER — Other Ambulatory Visit: Payer: Self-pay

## 2021-01-23 VITALS — BP 120/90 | HR 72 | Ht 61.0 in | Wt 272.0 lb

## 2021-01-23 DIAGNOSIS — I480 Paroxysmal atrial fibrillation: Secondary | ICD-10-CM

## 2021-01-23 DIAGNOSIS — I471 Supraventricular tachycardia: Secondary | ICD-10-CM | POA: Diagnosis not present

## 2021-01-23 DIAGNOSIS — I119 Hypertensive heart disease without heart failure: Secondary | ICD-10-CM | POA: Diagnosis not present

## 2021-01-23 DIAGNOSIS — R040 Epistaxis: Secondary | ICD-10-CM | POA: Diagnosis not present

## 2021-01-23 NOTE — Patient Instructions (Signed)

## 2021-02-13 ENCOUNTER — Encounter: Payer: Self-pay | Admitting: Orthopedic Surgery

## 2021-02-13 ENCOUNTER — Ambulatory Visit (INDEPENDENT_AMBULATORY_CARE_PROVIDER_SITE_OTHER): Payer: 59

## 2021-02-13 ENCOUNTER — Ambulatory Visit (INDEPENDENT_AMBULATORY_CARE_PROVIDER_SITE_OTHER): Payer: 59 | Admitting: Orthopedic Surgery

## 2021-02-13 ENCOUNTER — Other Ambulatory Visit: Payer: Self-pay

## 2021-02-13 ENCOUNTER — Other Ambulatory Visit: Payer: 59

## 2021-02-13 DIAGNOSIS — M25512 Pain in left shoulder: Secondary | ICD-10-CM

## 2021-02-13 DIAGNOSIS — I10 Essential (primary) hypertension: Secondary | ICD-10-CM

## 2021-02-13 DIAGNOSIS — E8881 Metabolic syndrome: Secondary | ICD-10-CM

## 2021-02-13 DIAGNOSIS — E559 Vitamin D deficiency, unspecified: Secondary | ICD-10-CM

## 2021-02-13 DIAGNOSIS — E782 Mixed hyperlipidemia: Secondary | ICD-10-CM

## 2021-02-13 NOTE — Progress Notes (Signed)
Office Visit Note   Patient: Rebecca Rojas           Date of Birth: 15-Dec-1955           MRN: [id number] Visit Date: 02/13/2021 Requested by: Blane Ohara, MD [address] [place],  [place] [address] PCP: Blane Ohara, MD  Subjective: Chief Complaint  Patient presents with   Left Shoulder - Pain    HPI: Rebecca Rojas is a 65 y.o. adult who presents to the office complaining of left shoulder pain.  Patient fell about 4 weeks ago on her outstretched left arm.  She felt a pop when she fell and complains of severe pain and weakness of the left arm since the fall.  Localizes pain to the anterior aspect of the shoulder with radiation of the bicep.  No radiation past the bicep.  Occasional neck pain in the sternocleidomastoid muscle.  No scapular pain.  No numbness or tingling.  She has difficulty lifting a gallon of milk and has to support her arm when she is moving her arm while she is laying down.  She has sharp shooting pains as well as a constant ache.  She is right-hand dominant.  She wakes with pain every single night.  Currently she is retired and enjoys trail riding and camping.  She has no prior injury or surgery on the left shoulder.  She does have history of rotator cuff repair on the right but the pain she is currently experiencing is worse than the pain she had from her rotator cuff tear.  No history of diabetes.  She also notes a clicking and popping sensation couple times per week in the anterior shoulder when she moves her arm..                ROS: All systems reviewed are negative as they relate to the chief complaint within the history of present illness.  Patient denies fevers or chills.  Assessment & Plan: Visit Diagnoses:  1. Left shoulder pain, unspecified chronicity     Plan: Patient is a 65 year old female who presents complaining of left shoulder pain.  She had FOOSH injury to the left shoulder about a month ago.  Pain is not improved at all.  Right shoulder  radiographs are negative for any acute findings to explain her pain.  She does have severe anterior tenderness over the bicipital groove and a clicking sensation several times a week that is concerning for SLAP tear versus upper subscap border tear with biceps tendon instability.  No rotator cuff weakness on exam but with such mechanical symptoms and no improvement of pain, as well as the severe pain that wakes her up every night, plan to order MRI arthrogram of the left shoulder for further evaluation.  Follow-up after MRI to review results.  Follow-Up Instructions: No follow-ups on file.   Orders:  Orders Placed This Encounter  Procedures   XR Shoulder Left   MR Shoulder Left w/ contrast   Arthrogram   No orders of the defined types were placed in this encounter.     Procedures: No procedures performed   Clinical Data: No additional findings.  Objective: Vital Signs: There were no vitals taken for this visit.  Physical Exam:  Constitutional: Patient appears well-developed HEENT:  Head: Normocephalic Eyes:EOM are normal Neck: Normal range of motion Cardiovascular: Normal rate Pulmonary/chest: Effort normal Neurologic: Patient is alert Skin: Skin is warm Psychiatric: Patient has normal mood and affect  Ortho  Exam: Ortho exam demonstrates left shoulder with 60 degrees external rotation, 70 degrees abduction, 160 degrees forward flexion.  This compared with the right shoulder with 60 degrees external rotation, 95 degrees abduction, 180 degrees forward flexion.  Excellent rotator cuff strength of supra, infra, subscap bilaterally.  Negative belly press test.  Negative Hornblower sign.  Negative drop arm test.  5/5 motor strength of bilateral grip strength, finger abduction, pronation/supination, bicep, tricep, deltoid.  No tenderness over the axial cervical spine.  Negative Spurling sign.  No Popeye deformity noted.  No ecchymosis.  Moderate to severe tenderness over the bicipital  groove.  No significant tenderness over the [facility] joint compared with the contralateral side.  Specialty Comments:  No specialty comments available.  Imaging: No results found.   PMFS History: Patient Active Problem List   Diagnosis Date Noted   Hypertensive heart disease 06/02/2020   Shortness of breath    Osteoarthritis    Lower extremity edema    Joint pain    History of seasonal allergies    Back pain    Arthritis    Anemia    Allergy-induced asthma    Allergy    Atrial fibrillation with RVR (HCC) 05/20/2020   Sleep apnea 05/20/2020   Asthma with exacerbation 04/27/2020   Acute laryngopharyngitis 01/13/2020   Insulin resistance 10/01/2019   Rotator cuff tear, right 06/15/2019   Glenoid labral tear, right, initial encounter 06/15/2019   Arthritis of right acromioclavicular joint 06/15/2019   Bilateral lower extremity edema 08/04/2018   Unspecified open wound, left lower leg, initial encounter 08/04/2018   Pharyngitis 05/23/2018   Sore throat 05/11/2018   Persistent cough for 3 weeks or longer 10/11/2017   Acute non-recurrent frontal sinusitis 09/22/2017   Vertigo 07/18/2017   Hordeolum internum of right lower eyelid 02/14/2017   Pain of right eye 02/14/2017   Healthcare maintenance 01/08/2017   Encounter for screening colonoscopy 01/08/2017   Skin lesion of foot 08/30/2016   Pain of right foot 08/30/2016   Morbid obesity (HCC) 12/28/2015   Vitamin D deficiency 12/28/2015   Asthma 12/28/2015   Benign essential hypertension 12/28/2015   High risk medication use 12/28/2015   Migraine headache 12/28/2015   Mixed hyperlipidemia 12/28/2015   Myalgia 12/28/2015   Lower respiratory infection (e.g., bronchitis, pneumonia, pneumonitis, pulmonitis) 05/10/2009   Past Medical History:  Diagnosis Date   Acute laryngopharyngitis 01/13/2020   Acute non-recurrent frontal sinusitis 09/22/2017   Allergy    dust, environmental, feathers, has epipen, prn   Allergy-induced asthma     Anemia    Arthritis    osteoarthritis   Arthritis of right acromioclavicular joint 06/15/2019   Asthma 12/28/2015   Asthma with exacerbation 04/27/2020   Atrial fibrillation with RVR (HCC) 05/20/2020   Back pain    Benign essential hypertension 12/28/2015   Bilateral lower extremity edema 08/04/2018   Encounter for screening colonoscopy 01/08/2017   Glenoid labral tear, right, initial encounter 06/15/2019   Headache(784.0)    occasional migraine   Healthcare maintenance 01/08/2017   High risk medication use 12/28/2015   History of seasonal allergies    Hordeolum internum of right lower eyelid 02/14/2017   Insulin resistance 10/01/2019   Joint pain    Lower extremity edema    Lower respiratory infection (e.g., bronchitis, pneumonia, pneumonitis, pulmonitis) 05/10/2009   Qualifier: Diagnosis of  By: Shelle Iron MD, Maree Krabbe    Migraine headache 12/28/2015   Mixed hyperlipidemia 12/28/2015   Morbid obesity (HCC)    Myalgia  12/28/2015   Osteoarthritis    Pain of right eye 02/14/2017   Pain of right foot 08/30/2016   Persistent cough for 3 weeks or longer 10/11/2017   Pharyngitis 05/23/2018   Rotator cuff tear, right 06/15/2019   Shortness of breath    maybe with exertion   Skin lesion of foot 08/30/2016   Sleep apnea    uses cpap   Sore throat 05/11/2018   Unspecified open wound, left lower leg, initial encounter 08/04/2018   Vertigo    Vitamin D deficiency     Family History  Problem Relation Age of Onset   Hypertension Mother    Heart disease Mother    Heart attack Mother    Hyperlipidemia Mother    Stroke Mother    Obesity Mother    Cancer Father    Liver disease Father    Obesity Father    Anesthesia problems Neg Hx    Hypotension Neg Hx    Malignant hyperthermia Neg Hx    Pseudochol deficiency Neg Hx    Colon cancer Neg Hx    Colon polyps Neg Hx    Esophageal cancer Neg Hx    Rectal cancer Neg Hx    Stomach cancer Neg Hx     Past Surgical History:  Procedure Laterality Date    ABDOMINAL HYSTERECTOMY     COLONOSCOPY  09/2010   normal   FRACTURE SURGERY     left 5th finger fx with bone graft repair   HEEL SPUR EXCISION     JOINT REPLACEMENT     KNEE ARTHROSCOPY Left    Left Knee lateral release     LIPOMA EXCISION     SHOULDER ARTHROSCOPY WITH ROTATOR CUFF REPAIR AND SUBACROMIAL DECOMPRESSION Right 06/15/2019   Procedure: SHOULDER ARTHROSCOPY WITH ROTATOR CUFF REPAIR AND SUBACROMIAL DECOMPRESSION;  Surgeon: Jodi Geralds, MD;  Location: [facility];  Service: Orthopedics;  Laterality: Right;   SVT ABLATION N/A 12/06/2020   Procedure: SVT ABLATION;  Surgeon: Regan Lemming, MD;  Location: [facility];  Service: Cardiovascular;  Laterality: N/A;   TONSILLECTOMY     TOTAL KNEE ARTHROPLASTY  08/03/2011   Procedure: TOTAL KNEE ARTHROPLASTY;  Surgeon: Harvie Junior;  Location: [facility];  Service: Orthopedics;  Laterality: Right;  COMPUTER ASSISTED TOTAL KNEE REPLACEMENTwith revision tibial component   TUBAL LIGATION     Social History   Occupational History   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  Tobacco Use   Smoking status: Never   Smokeless tobacco: Never  Vaping Use   Vaping Use: Never used  Substance and Sexual Activity   Alcohol use: Not Currently   Drug use: Never   Sexual activity: Yes

## 2021-02-14 LAB — CBC WITH DIFFERENTIAL/PLATELET
Basophils Absolute: 0.1 10*3/uL (ref 0.0–0.2)
Basos: 1 %
EOS (ABSOLUTE): 0.3 10*3/uL (ref 0.0–0.4)
Eos: 4 %
Hematocrit: 41 % (ref 34.0–46.6)
Hemoglobin: 13.3 g/dL (ref 11.1–15.9)
Immature Grans (Abs): 0 10*3/uL (ref 0.0–0.1)
Immature Granulocytes: 1 %
Lymphocytes Absolute: 3.4 10*3/uL — ABNORMAL HIGH (ref 0.7–3.1)
Lymphs: 43 %
MCH: 29.3 pg (ref 26.6–33.0)
MCHC: 32.4 g/dL (ref 31.5–35.7)
MCV: 90 fL (ref 79–97)
Monocytes Absolute: 0.6 10*3/uL (ref 0.1–0.9)
Monocytes: 8 %
Neutrophils Absolute: 3.5 10*3/uL (ref 1.4–7.0)
Neutrophils: 43 %
Platelets: 316 10*3/uL (ref 150–450)
RBC: 4.54 x10E6/uL (ref 3.77–5.28)
RDW: 13.1 % (ref 11.7–15.4)
WBC: 7.9 10*3/uL (ref 3.4–10.8)

## 2021-02-14 LAB — LIPID PANEL
Chol/HDL Ratio: 4.4 ratio (ref 0.0–4.4)
Cholesterol, Total: 180 mg/dL (ref 100–199)
HDL: 41 mg/dL (ref >39)
LDL Chol Calc (NIH): 110 mg/dL — ABNORMAL HIGH (ref 0–99)
Triglycerides: 165 mg/dL — ABNORMAL HIGH (ref 0–149)
VLDL Cholesterol Cal: 29 mg/dL (ref 5–40)

## 2021-02-14 LAB — COMPREHENSIVE METABOLIC PANEL
ALT: 17 IU/L (ref 0–32)
AST: 14 IU/L (ref 0–40)
Albumin/Globulin Ratio: 1.4 (ref 1.2–2.2)
Albumin: 3.7 g/dL — ABNORMAL LOW (ref 3.8–4.8)
Alkaline Phosphatase: 84 IU/L (ref 44–121)
BUN/Creatinine Ratio: 18 (ref 12–28)
BUN: 13 mg/dL (ref 8–27)
Bilirubin Total: 0.5 mg/dL (ref 0.0–1.2)
CO2: 24 mmol/L (ref 20–29)
Calcium: 8.6 mg/dL — ABNORMAL LOW (ref 8.7–10.3)
Chloride: 105 mmol/L (ref 96–106)
Creatinine, Ser: 0.71 mg/dL (ref 0.57–1.00)
Globulin, Total: 2.6 g/dL (ref 1.5–4.5)
Glucose: 105 mg/dL — ABNORMAL HIGH (ref 65–99)
Potassium: 4.4 mmol/L (ref 3.5–5.2)
Sodium: 143 mmol/L (ref 134–144)
Total Protein: 6.3 g/dL (ref 6.0–8.5)
eGFR: 95 mL/min/{1.73_m2} (ref >59)

## 2021-02-14 LAB — HEMOGLOBIN A1C
Est. average glucose Bld gHb Est-mCnc: 111 mg/dL
Hgb A1c MFr Bld: 5.5 % (ref 4.8–5.6)

## 2021-02-14 LAB — CARDIOVASCULAR RISK ASSESSMENT

## 2021-02-14 LAB — VITAMIN D 25 HYDROXY (VIT D DEFICIENCY, FRACTURES): Vit D, 25-Hydroxy: 16 ng/mL — ABNORMAL LOW (ref 30.0–100.0)

## 2021-02-14 LAB — TSH: TSH: 4.45 u[IU]/mL (ref 0.450–4.500)

## 2021-02-15 ENCOUNTER — Encounter: Payer: Self-pay | Admitting: Physician Assistant

## 2021-02-15 ENCOUNTER — Other Ambulatory Visit: Payer: Self-pay

## 2021-02-15 ENCOUNTER — Encounter: Payer: 59 | Admitting: Physician Assistant

## 2021-02-15 ENCOUNTER — Ambulatory Visit (INDEPENDENT_AMBULATORY_CARE_PROVIDER_SITE_OTHER): Payer: 59 | Admitting: Physician Assistant

## 2021-02-15 VITALS — BP 118/80 | HR 79 | Temp 97.9°F | Ht 61.0 in | Wt 273.4 lb

## 2021-02-15 DIAGNOSIS — J301 Allergic rhinitis due to pollen: Secondary | ICD-10-CM | POA: Insufficient documentation

## 2021-02-15 DIAGNOSIS — Z23 Encounter for immunization: Secondary | ICD-10-CM

## 2021-02-15 DIAGNOSIS — J4530 Mild persistent asthma, uncomplicated: Secondary | ICD-10-CM

## 2021-02-15 DIAGNOSIS — J3081 Allergic rhinitis due to animal (cat) (dog) hair and dander: Secondary | ICD-10-CM | POA: Insufficient documentation

## 2021-02-15 DIAGNOSIS — R0789 Other chest pain: Secondary | ICD-10-CM

## 2021-02-15 DIAGNOSIS — E559 Vitamin D deficiency, unspecified: Secondary | ICD-10-CM | POA: Insufficient documentation

## 2021-02-15 DIAGNOSIS — J309 Allergic rhinitis, unspecified: Secondary | ICD-10-CM | POA: Insufficient documentation

## 2021-02-15 DIAGNOSIS — Z0000 Encounter for general adult medical examination without abnormal findings: Secondary | ICD-10-CM

## 2021-02-15 DIAGNOSIS — H1045 Other chronic allergic conjunctivitis: Secondary | ICD-10-CM | POA: Insufficient documentation

## 2021-02-15 DIAGNOSIS — Z1231 Encounter for screening mammogram for malignant neoplasm of breast: Secondary | ICD-10-CM | POA: Insufficient documentation

## 2021-02-15 DIAGNOSIS — G4730 Sleep apnea, unspecified: Secondary | ICD-10-CM

## 2021-02-15 DIAGNOSIS — N958 Other specified menopausal and perimenopausal disorders: Secondary | ICD-10-CM

## 2021-02-15 HISTORY — DX: Encounter for screening mammogram for malignant neoplasm of breast: Z12.31

## 2021-02-15 HISTORY — DX: Encounter for general adult medical examination without abnormal findings: Z00.00

## 2021-02-15 HISTORY — DX: Mild persistent asthma, uncomplicated: J45.30

## 2021-02-15 HISTORY — DX: Encounter for immunization: Z23

## 2021-02-15 HISTORY — DX: Other chest pain: R07.89

## 2021-02-15 HISTORY — DX: Other specified menopausal and perimenopausal disorders: N95.8

## 2021-02-15 HISTORY — DX: Sleep apnea, unspecified: G47.30

## 2021-02-15 HISTORY — DX: Allergic rhinitis, unspecified: J30.9

## 2021-02-15 HISTORY — DX: Allergic rhinitis due to pollen: J30.1

## 2021-02-15 HISTORY — DX: Allergic rhinitis due to animal (cat) (dog) hair and dander: J30.81

## 2021-02-15 HISTORY — DX: Other chronic allergic conjunctivitis: H10.45

## 2021-02-15 MED ORDER — VITAMIN D (ERGOCALCIFEROL) 1.25 MG (50000 UNIT) PO CAPS: 50000.0000 [IU] | ORAL_CAPSULE | ORAL | 3 refills | Status: DC

## 2021-02-15 NOTE — Progress Notes (Signed)
Subjective:  Patient ID: Rebecca Rojas, adult    DOB: [birth year]  Age: 65 y.o. MRN: [id number]  Chief Complaint  Patient presents with   Annual Exam    HPI Well Adult Physical: Patient here for a comprehensive physical exam.The patient reports problems - states about a month ago she did have episode of chest discomfort - does follow with cardiology but not due for follow up until October -- did have cardiac ablasion 4/22 Do you take any herbs or supplements that were not prescribed by a doctor? no Are you taking calcium supplements? no Are you taking aspirin daily? no  Encounter for general adult medical examination without abnormal findings  Physical ("At Risk" items are starred): Patient's last physical exam was 1 year ago .  Smoking: Life-long non-smoker ;  Physical Activity: does not exercise ;  Alcohol/Drug Use: Is a non-drinker ; No illicit drug use ;  Patient is not afflicted from Stress Incontinence and Urge Incontinence  Safety: reviewed. Patient wears a seat belt, has smoke detectors, has carbon monoxide detectors, practices appropriate gun safety, and wears sunscreen with extended sun exposure. Dental Care: biannual cleanings, brushes and flosses daily. Ophthalmology/Optometry: Annual visit.  Hearing loss: none  Postmenopausal - has had partial hysterectomy Safe at home: yes Self breast exams: yes  Is due for dexa scan and mammogram  Flowsheet Row Office Visit from 11/15/2020 in [facility]  PHQ-2 Total Score 0               Social Hx   Social History   Socioeconomic History   Marital status: Married    Spouse name: Auset Fritzler   Number of children: 2   Years of education: Not on file   Highest education level: Not on file  Occupational History   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  Tobacco Use   Smoking status: Never   Smokeless tobacco: Never  Vaping Use   Vaping Use: Never used  Substance and Sexual Activity   Alcohol use: Not  Currently   Drug use: Never   Sexual activity: Yes  Other Topics Concern   Not on file  Social History Narrative   Not on file   Social Determinants of Health   Financial Resource Strain: Not on file  Food Insecurity: Not on file  Transportation Needs: Not on file  Physical Activity: Not on file  Stress: Not on file  Social Connections: Not on file   Past Medical History:  Diagnosis Date   Acute laryngopharyngitis 01/13/2020   Acute non-recurrent frontal sinusitis 09/22/2017   Allergy    dust, environmental, feathers, has epipen, prn   Allergy-induced asthma    Anemia    Arthritis    osteoarthritis   Arthritis of right acromioclavicular joint 06/15/2019   Asthma 12/28/2015   Asthma with exacerbation 04/27/2020   Atrial fibrillation with RVR (HCC) 05/20/2020   Back pain    Benign essential hypertension 12/28/2015   Bilateral lower extremity edema 08/04/2018   Encounter for screening colonoscopy 01/08/2017   Glenoid labral tear, right, initial encounter 06/15/2019   Headache(784.0)    occasional migraine   Healthcare maintenance 01/08/2017   High risk medication use 12/28/2015   History of seasonal allergies    Hordeolum internum of right lower eyelid 02/14/2017   Insulin resistance 10/01/2019   Joint pain    Lower extremity edema    Lower respiratory infection (e.g., bronchitis, pneumonia, pneumonitis, pulmonitis) 05/10/2009   Qualifier: Diagnosis of  By: Shelle Iron  MD, Maree Krabbe    Migraine headache 12/28/2015   Mixed hyperlipidemia 12/28/2015   Morbid obesity (HCC)    Myalgia 12/28/2015   Osteoarthritis    Pain of right eye 02/14/2017   Pain of right foot 08/30/2016   Persistent cough for 3 weeks or longer 10/11/2017   Pharyngitis 05/23/2018   Rotator cuff tear, right 06/15/2019   Shortness of breath    maybe with exertion   Skin lesion of foot 08/30/2016   Sleep apnea    uses cpap   Sore throat 05/11/2018   Unspecified open wound, left lower leg, initial encounter 08/04/2018    Vertigo    Vitamin D deficiency    Past Surgical History:  Procedure Laterality Date   ABDOMINAL HYSTERECTOMY     COLONOSCOPY  09/2010   normal   FRACTURE SURGERY     left 5th finger fx with bone graft repair   HEEL SPUR EXCISION     JOINT REPLACEMENT     KNEE ARTHROSCOPY Left    Left Knee lateral release     LIPOMA EXCISION     SHOULDER ARTHROSCOPY WITH ROTATOR CUFF REPAIR AND SUBACROMIAL DECOMPRESSION Right 06/15/2019   Procedure: SHOULDER ARTHROSCOPY WITH ROTATOR CUFF REPAIR AND SUBACROMIAL DECOMPRESSION;  Surgeon: Jodi Geralds, MD;  Location: [facility];  Service: Orthopedics;  Laterality: Right;   SVT ABLATION N/A 12/06/2020   Procedure: SVT ABLATION;  Surgeon: Regan Lemming, MD;  Location: [facility];  Service: Cardiovascular;  Laterality: N/A;   TONSILLECTOMY     TOTAL KNEE ARTHROPLASTY  08/03/2011   Procedure: TOTAL KNEE ARTHROPLASTY;  Surgeon: Harvie Junior;  Location: [facility];  Service: Orthopedics;  Laterality: Right;  COMPUTER ASSISTED TOTAL KNEE REPLACEMENTwith revision tibial component   TUBAL LIGATION      Family History  Problem Relation Age of Onset   Hypertension Mother    Heart disease Mother    Heart attack Mother    Hyperlipidemia Mother    Stroke Mother    Obesity Mother    Cancer Father    Liver disease Father    Obesity Father    Anesthesia problems Neg Hx    Hypotension Neg Hx    Malignant hyperthermia Neg Hx    Pseudochol deficiency Neg Hx    Colon cancer Neg Hx    Colon polyps Neg Hx    Esophageal cancer Neg Hx    Rectal cancer Neg Hx    Stomach cancer Neg Hx     Review of Systems CONSTITUTIONAL: Negative for chills, fatigue, fever, unintentional weight gain and unintentional weight loss.  E/N/T: Negative for ear pain, nasal congestion and sore throat.  CARDIOVASCULAR: see HPI RESPIRATORY: Negative for recent cough and dyspnea.  GASTROINTESTINAL: Negative for abdominal pain, acid reflux symptoms, constipation, diarrhea, nausea and  vomiting.  MSK: Negative for arthralgias and myalgias.  INTEGUMENTARY: Negative for rash.  NEUROLOGICAL: Negative for dizziness and headaches.  PSYCHIATRIC: Negative for sleep disturbance and to question depression screen.  Negative for depression, negative for anhedonia.       Objective:  BP 118/80 (BP Location: Left Arm, Patient Position: Sitting, Cuff Size: Large)   Pulse 79   Temp 97.9 F (36.6 C) (Temporal)   Ht 5\' 1"  (1.549 m)   Wt 273 lb 6.4 oz (124 kg)   SpO2 95%   BMI 51.66 kg/m   BP/Weight 02/15/2021 01/23/2021 01/02/2021  Systolic BP 118 120 122  Diastolic BP 80 90 80  Wt. (Lbs) 273.4 272  268.8  BMI 51.66 51.39 50.79    Physical Exam PHYSICAL EXAM:   VS: BP 118/80 (BP Location: Left Arm, Patient Position: Sitting, Cuff Size: Large)   Pulse 79   Temp 97.9 F (36.6 C) (Temporal)   Ht 5\' 1"  (1.549 m)   Wt 273 lb 6.4 oz (124 kg)   SpO2 95%   BMI 51.66 kg/m   GEN: Well nourished, well developed, in no acute distress  HEENT: normal external ears and nose - normal external auditory canals and TMS - hearing grossly normal - - Lips, Teeth and Gums - normal  Oropharynx - normal mucosa, palate, and posterior pharynx  Cardiac: RRR; no murmurs, rubs, or gallops,no edema -  Respiratory:  normal respiratory rate and pattern with no distress - normal breath sounds with no rales, rhonchi, wheezes or rubs GI: normal bowel sounds, no masses or tenderness MS: no deformity or atrophy  Skin: warm and dry, no rash  Psych: euthymic mood, appropriate affect and demeanor  EKG- no acute changes - minimal Twave changes from prior EKG 12/2020 from cardiology Lab Results  Component Value Date   WBC 7.9 02/13/2021   HGB 13.3 02/13/2021   HCT 41.0 02/13/2021   PLT 316 02/13/2021   GLUCOSE 105 (H) 02/13/2021   CHOL 180 02/13/2021   TRIG 165 (H) 02/13/2021   HDL 41 02/13/2021   LDLCALC 110 (H) 02/13/2021   ALT 17 02/13/2021   AST 14 02/13/2021   NA 143 02/13/2021   K 4.4  02/13/2021   CL 105 02/13/2021   CREATININE 0.71 02/13/2021   BUN 13 02/13/2021   CO2 24 02/13/2021   TSH 4.450 02/13/2021   INR 1.53 (H) 08/06/2011   HGBA1C 5.5 02/13/2021      Assessment & Plan:  1. Annual physical exam Labwork done recently Dexa scann and mammogram ordered  2. Other chest pain - EKG 12-Lead Recommend to follow up with cardiology - pt will call  3. Need for shingles vaccine - Varicella-zoster vaccine IM (Shingrix)    Body mass index is 51.66 kg/m.   These are the goals we discussed:  Goals   None      This is a list of the screening recommended for you and due dates:  Health Maintenance  Topic Date Due   Mammogram  Never done   COVID-19 Vaccine (4 - Booster for Pfizer series) 03/03/2021*   Pneumococcal Vaccination (1 - PCV) 02/15/2022*   Flu Shot  03/20/2021   Zoster (Shingles) Vaccine (2 of 2) 04/12/2021   Tetanus Vaccine  10/01/2022   Colon Cancer Screening  04/14/2030   HPV Vaccine  Aged Out   Pap Smear  Discontinued   Hepatitis C Screening: USPSTF Recommendation to screen - Ages 3818-79 yo.  Discontinued   HIV Screening  Discontinued  *Topic was postponed. The date shown is not the original due date.     AN INDIVIDUALIZED CARE PLAN: was established or reinforced today.   SELF MANAGEMENT: The patient and I together assessed ways to personally work towards obtaining the recommended goals  Support needs The patient and/or family needs were assessed and services were offered if appropriate.  Meds ordered this encounter  Medications   Vitamin D, Ergocalciferol, (DRISDOL) 1.25 MG (50000 UNIT) CAPS capsule    Sig: Take 1 capsule (50,000 Units total) by mouth every 7 (seven) days.    Dispense:  12 capsule    Refill:  3    Order Specific Question:   Supervising Provider  AnswerBlane Ohara [[id number]]    Follow-up: Return in about 2 months (around 04/17/2021) for for shingrix.  An After Visit Summary was printed and given to the  patient.  Jettie Pagan [facility] [phone]

## 2021-03-02 ENCOUNTER — Other Ambulatory Visit: Payer: Self-pay

## 2021-03-02 DIAGNOSIS — R519 Headache, unspecified: Secondary | ICD-10-CM | POA: Insufficient documentation

## 2021-03-03 ENCOUNTER — Other Ambulatory Visit: Payer: Self-pay

## 2021-03-03 ENCOUNTER — Ambulatory Visit (INDEPENDENT_AMBULATORY_CARE_PROVIDER_SITE_OTHER): Payer: 59 | Admitting: Cardiology

## 2021-03-03 ENCOUNTER — Encounter: Payer: Self-pay | Admitting: Cardiology

## 2021-03-03 VITALS — BP 155/82 | HR 86 | Ht 61.0 in | Wt 274.6 lb

## 2021-03-03 DIAGNOSIS — I471 Supraventricular tachycardia, unspecified: Secondary | ICD-10-CM

## 2021-03-03 DIAGNOSIS — I4510 Unspecified right bundle-branch block: Secondary | ICD-10-CM

## 2021-03-03 DIAGNOSIS — Z7901 Long term (current) use of anticoagulants: Secondary | ICD-10-CM | POA: Diagnosis not present

## 2021-03-03 DIAGNOSIS — I480 Paroxysmal atrial fibrillation: Secondary | ICD-10-CM

## 2021-03-03 NOTE — Progress Notes (Signed)
Cardiology Office Note:    Date:  03/03/2021   ID:  Rebecca Rojas, DOB Apr 02, 1956, MRN [id number]  PCP:  Rochel Brome, MD  Cardiologist:  Shirlee More, MD    Referring MD: Rochel Brome, MD    ASSESSMENT:    1. RBBB   2. SVT (supraventricular tachycardia) (HCC)   3. Paroxysmal atrial fibrillation ([place])   4. Chronic anticoagulation    PLAN:    In order of problems listed above:  She has a stable pattern right bundle branch block generally not associated with need for pacemaker in the future or left ventricular dysfunction.  She is reassured Continues to do well after ablation no recurrent SVT or atrial fibrillation She will continue her anticoagulation for total of 10 months as directed by EP Should consider to supervise weight loss program as she struggles with recently   Next appointment: 6 months   Medication Adjustments/Labs and Tests Ordered: Current medicines are reviewed at length with the patient today.  Concerns regarding medicines are outlined above.  No orders of the defined types were placed in this encounter.  No orders of the defined types were placed in this encounter.   Chief Complaint  Patient presents with   Follow-up    Directed by her PCP for EKG changes    History of Present Illness:    Rebecca Rojas is a 65 y.o. adult with a hx of SVT and paroxysmal atrial fibrillation with EP catheter ablation hypertensive heart disease with heart failure and anticoagulation complicated by epistasis last seen 01/23/2021. Compliance with diet, lifestyle and medications: Yes  She continues to do well after ablation she has had some minor vague momentary chest discomfort nothing bothersome and no rapid heart rhythms.  She remains anticoagulated without bleeding complication She is frustrated by the inability to lose weight I encouraged her to consider a structured focus weight loss program offered through [facility].  She will discuss with Dr. Tobie Poet  She had an  EKG done that shows right bundle branch block and some minor nonspecific ST changes not significantly different than her previous EKG. Past Medical History:  Diagnosis Date   Acute laryngopharyngitis 01/13/2020   Acute non-recurrent frontal sinusitis 09/22/2017   Allergic rhinitis 02/15/2021   Allergic rhinitis due to animal (cat) (dog) hair and dander 02/15/2021   Allergic rhinitis due to pollen 02/15/2021   Allergy    dust, environmental, feathers, has epipen, prn   Allergy-induced asthma    Anemia    Annual physical exam 02/15/2021   Arthritis    osteoarthritis   Arthritis of right acromioclavicular joint 06/15/2019   Asthma 12/28/2015   Asthma with exacerbation 04/27/2020   Atrial fibrillation ([place]) 05/20/2020   Atrial fibrillation with RVR ([place]) 05/20/2020   Back pain    Benign essential hypertension 12/28/2015   Bilateral lower extremity edema 08/04/2018   Chronic allergic conjunctivitis 02/15/2021   Encounter for screening colonoscopy 01/08/2017   Encounter for screening mammogram for breast cancer 02/15/2021   Glenoid labral tear, right, initial encounter 06/15/2019   Headache    Healthcare maintenance 01/08/2017   High risk medication use 12/28/2015   History of seasonal allergies    Hordeolum internum of right lower eyelid 02/14/2017   Hypertensive heart disease 06/02/2020   Insulin resistance 10/01/2019   Joint pain    Lower extremity edema    Lower respiratory infection (e.g., bronchitis, pneumonia, pneumonitis, pulmonitis) 05/10/2009   Qualifier: Diagnosis of  By: Gwenette Greet MD, Armando Reichert  Migraine headache 12/28/2015   Mild persistent asthma, uncomplicated 4/65/6812   Mixed hyperlipidemia 12/28/2015   Morbid obesity ([place])    Myalgia 12/28/2015   Need for shingles vaccine 02/15/2021   Osteoarthritis    Other chest pain 02/15/2021   Other specified menopausal and perimenopausal disorders 02/15/2021   Pain of right eye 02/14/2017   Pain of right foot 08/30/2016    Persistent cough for 3 weeks or longer 10/11/2017   Pharyngitis 05/23/2018   Rotator cuff tear, right 06/15/2019   Shortness of breath    maybe with exertion   Skin lesion of foot 08/30/2016   Sleep apnea    uses cpap   Sleep apnea with use of continuous positive airway pressure (CPAP) 02/15/2021   Sore throat 05/11/2018   Unspecified open wound, left lower leg, initial encounter 08/04/2018   Vertigo    Vitamin D deficiency     Past Surgical History:  Procedure Laterality Date   ABDOMINAL HYSTERECTOMY     COLONOSCOPY  09/2010   normal   FRACTURE SURGERY     left 5th finger fx with bone graft repair   HEEL SPUR EXCISION     JOINT REPLACEMENT     KNEE ARTHROSCOPY Left    Left Knee lateral release     LIPOMA EXCISION     SHOULDER ARTHROSCOPY WITH ROTATOR CUFF REPAIR AND SUBACROMIAL DECOMPRESSION Right 06/15/2019   Procedure: SHOULDER ARTHROSCOPY WITH ROTATOR CUFF REPAIR AND SUBACROMIAL DECOMPRESSION;  Surgeon: Dorna Leitz, MD;  Location: [facility];  Service: Orthopedics;  Laterality: Right;   SVT ABLATION N/A 12/06/2020   Procedure: SVT ABLATION;  Surgeon: Constance Haw, MD;  Location: [facility];  Service: Cardiovascular;  Laterality: N/A;   TONSILLECTOMY     TOTAL KNEE ARTHROPLASTY  08/03/2011   Procedure: TOTAL KNEE ARTHROPLASTY;  Surgeon: Alta Corning;  Location: [facility];  Service: Orthopedics;  Laterality: Right;  COMPUTER ASSISTED TOTAL KNEE REPLACEMENTwith revision tibial component   TUBAL LIGATION      Current Medications: Current Meds  Medication Sig   acetaminophen (TYLENOL) 500 MG tablet Take 1,000 mg by mouth every 6 (six) hours as needed for moderate pain.   albuterol (PROVENTIL HFA;VENTOLIN HFA) 108 (90 BASE) MCG/ACT inhaler Inhale 1-2 puffs into the lungs every 6 (six) hours as needed for wheezing or shortness of breath.    apixaban (ELIQUIS) 5 MG TABS tablet Take 1 tablet (5 mg total) by mouth 2 (two) times daily.   azelastine (OPTIVAR) 0.05 %  ophthalmic solution Place 1 drop into both eyes 2 (two) times daily as needed for allergies (allergies).   cyclobenzaprine (FLEXERIL) 10 MG tablet Take 1 tablet (10 mg total) by mouth 3 (three) times daily as needed for muscle spasms.   diltiazem (TIAZAC) 180 MG 24 hr capsule Take 1 capsule (180 mg total) by mouth daily.   EPINEPHrine 0.3 mg/0.3 mL IJ SOAJ injection Inject 0.3 mg into the muscle as needed for anaphylaxis.   fluticasone furoate-vilanterol (BREO ELLIPTA) 100-25 MCG/INH AEPB Inhale 1 puff into the lungs daily.   furosemide (LASIX) 20 MG tablet Take 40 mg by mouth daily as needed for edema (fluid retention.).   ipratropium (ATROVENT) 0.02 % nebulizer solution Take 3 mLs by nebulization every 6 (six) hours as needed for wheezing or shortness of breath.   levocetirizine (XYZAL) 5 MG tablet Take 5 mg by mouth every evening.   Magnesium 500 MG CAPS Take 500 mg by mouth daily.   metoprolol succinate (TOPROL  XL) 25 MG 24 hr tablet Take 1 tablet (25 mg total) by mouth daily.   mometasone (NASONEX) 50 MCG/ACT nasal spray Place 2 sprays into the nose daily.   montelukast (SINGULAIR) 10 MG tablet Take 10 mg by mouth at bedtime.   PRESCRIPTION MEDICATION Inject 1 kit into the skin every 7 (seven) days. Allergy shot unknown to pt   Vitamin D, Ergocalciferol, (DRISDOL) 1.25 MG (50000 UNIT) CAPS capsule Take 1 capsule (50,000 Units total) by mouth every 7 (seven) days.     Allergies:   Sulfa antibiotics, Cefdinir, Erythromycin, and Hydrocodone bit-homatrop mbr   Social History   Socioeconomic History   Marital status: Married    Spouse name: Pearlene Teat   Number of children: 2   Years of education: Not on file   Highest education level: Not on file  Occupational History   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  Tobacco Use   Smoking status: Never   Smokeless tobacco: Never  Vaping Use   Vaping Use: Never used  Substance and Sexual Activity   Alcohol use: Not Currently   Drug use:  Never   Sexual activity: Yes  Other Topics Concern   Not on file  Social History Narrative   Not on file   Social Determinants of Health   Financial Resource Strain: Not on file  Food Insecurity: Not on file  Transportation Needs: Not on file  Physical Activity: Not on file  Stress: Not on file  Social Connections: Not on file     Family History: The patient's family history includes Cancer in her father; Heart attack in her mother; Heart disease in her mother; Hyperlipidemia in her mother; Hypertension in her mother; Liver disease in her father; Obesity in her father and mother; Stroke in her mother. There is no history of Anesthesia problems, Hypotension, Malignant hyperthermia, Pseudochol deficiency, Colon cancer, Colon polyps, Esophageal cancer, Rectal cancer, or Stomach cancer. ROS:   Please see the history of present illness.    All other systems reviewed and are negative.  EKGs/Labs/Other Studies Reviewed:    The following studies were reviewed today:  EKG: EKG 02/15/2021 independently reviewed with the patient right bundle branch block minor nonspecific ST change  Recent Labs: 02/13/2021: ALT 17; BUN 13; Creatinine, Ser 0.71; Hemoglobin 13.3; Platelets 316; Potassium 4.4; Sodium 143; TSH 4.450  Recent Lipid Panel    Component Value Date/Time   CHOL 180 02/13/2021 0751   TRIG 165 (H) 02/13/2021 0751   HDL 41 02/13/2021 0751   CHOLHDL 4.4 02/13/2021 0751   LDLCALC 110 (H) 02/13/2021 0751    Physical Exam:    VS:  BP (!) 155/82 (BP Location: Left Arm, Patient Position: Sitting, Cuff Size: Large)   Pulse 86   Ht 5' 1" (1.549 m)   Wt 274 lb 9.6 oz (124.6 kg)   BMI 51.89 kg/m     Wt Readings from Last 3 Encounters:  03/03/21 274 lb 9.6 oz (124.6 kg)  02/15/21 273 lb 6.4 oz (124 kg)  01/23/21 272 lb (123.4 kg)     GEN:  Well nourished, well developed in no acute distress HEENT: Normal NECK: No JVD; No carotid bruits LYMPHATICS: No lymphadenopathy CARDIAC:  RRR, no murmurs, rubs, gallops RESPIRATORY:  Clear to auscultation without rales, wheezing or rhonchi  ABDOMEN: Soft, non-tender, non-distended MUSCULOSKELETAL:  No edema; No deformity  SKIN: Warm and dry NEUROLOGIC:  Alert and oriented x 3 PSYCHIATRIC:  Normal affect    Signed, Shirlee More, MD  03/03/2021 3:19 PM    [facility]

## 2021-03-03 NOTE — Patient Instructions (Signed)

## 2021-03-20 ENCOUNTER — Other Ambulatory Visit: Payer: Self-pay

## 2021-03-20 ENCOUNTER — Ambulatory Visit (HOSPITAL_COMMUNITY)
Admission: RE | Admit: 2021-03-20 | Discharge: 2021-03-20 | Disposition: A | Discharge status: Home / Self Care | Payer: 59 | Source: Ambulatory Visit | Attending: Orthopedic Surgery | Admitting: Orthopedic Surgery

## 2021-03-20 DIAGNOSIS — M25512 Pain in left shoulder: Secondary | ICD-10-CM | POA: Insufficient documentation

## 2021-03-20 MED ORDER — SODIUM CHLORIDE (PF) 0.9 % IJ SOLN
10.0000 mL | Freq: Once | INTRAMUSCULAR | Status: AC
  Administered 2021-03-20: 10 mL

## 2021-03-20 MED ORDER — LIDOCAINE HCL (PF) 1 % IJ SOLN
5.0000 mL | Freq: Once | INTRAMUSCULAR | Status: AC
  Administered 2021-03-20: 5 mL via INTRADERMAL

## 2021-03-20 MED ORDER — IOHEXOL 180 MG/ML  SOLN
10.0000 mL | Freq: Once | INTRAMUSCULAR | Status: AC | PRN
  Administered 2021-03-20: 10 mL via INTRA_ARTICULAR

## 2021-03-20 MED ORDER — GADOBUTROL 1 MMOL/ML IV SOLN
0.0500 mL | Freq: Once | INTRAVENOUS | Status: AC | PRN
  Administered 2021-03-20: 0.05 mL
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
## 2021-03-20 NOTE — Procedures (Signed)
Technically successful left shoulder arthrogram. Please see full dictation under Imaging in Epic.  Alwyn Ren, [place] [phone] 03/20/2021, 12:07 PM

## 2021-03-21 ENCOUNTER — Ambulatory Visit (INDEPENDENT_AMBULATORY_CARE_PROVIDER_SITE_OTHER): Payer: 59 | Admitting: Nurse Practitioner

## 2021-03-21 ENCOUNTER — Encounter: Payer: Self-pay | Admitting: Nurse Practitioner

## 2021-03-21 VITALS — BP 132/82 | HR 93 | Temp 97.5°F | Ht 61.0 in | Wt 274.8 lb

## 2021-03-21 DIAGNOSIS — S80861A Insect bite (nonvenomous), right lower leg, initial encounter: Secondary | ICD-10-CM

## 2021-03-21 DIAGNOSIS — S80862A Insect bite (nonvenomous), left lower leg, initial encounter: Secondary | ICD-10-CM | POA: Diagnosis not present

## 2021-03-21 DIAGNOSIS — L089 Local infection of the skin and subcutaneous tissue, unspecified: Secondary | ICD-10-CM | POA: Diagnosis not present

## 2021-03-21 DIAGNOSIS — W57XXXA Bitten or stung by nonvenomous insect and other nonvenomous arthropods, initial encounter: Secondary | ICD-10-CM | POA: Diagnosis not present

## 2021-03-21 MED ORDER — TRIAMCINOLONE ACETONIDE 0.1 % EX CREA: 1.0000 "application " | TOPICAL_CREAM | Freq: Two times a day (BID) | CUTANEOUS | 0 refills | Status: DC

## 2021-03-21 NOTE — Patient Instructions (Signed)
Apply Triamcinolone cream to insect bites on bilateral legs and groin Try not to scratch vesicles Follow-up if no improvement or lesions worsen    Triamcinolone Aerosol, Cream, Lotion, Ointment What is this medication? TRIAMCINOLONE (trye am [place]) reduces swelling, redness, itching, and rashes caused by skin conditions, such as eczema. It works by decreasing inflammation of the skin. It belongs to a group of medications called topicalsteroids. This medicine may be used for other purposes; ask your health care provider orpharmacist if you have questions. COMMON BRAND NAME(S): Aristocort, Aristocort A, Aristocort HP, Cinalog, Cinolar, DERMASORB TA Complete, Flutex, Kenalog, Pediaderm TA, Sila III, SP Rx228, Triacet, Trianex, Triderm What should I tell my care team before I take this medication? They need to know if you have any of these conditions: Large areas of burned or damaged skin Skin infection Thinning or wrinkling of the skin An unusual or allergic reaction to triamcinolone, steroids, other medications, foods, dyes, or preservatives Pregnant or trying to get pregnant Breast-feeding How should I use this medication? This medication is for external use only. Do not take by mouth. Wash your hands before and after use. If you are treating your hands, only wash your hands before use. Do not use on healthy skin or over large areas of skin. Do not get this medication in your eyes. If you do, rinse it out with plenty of cool tap water. Use it as directed on the prescription label at the same time every day. Do not use it more often than directed or for a longer time period than prescribed by your care team. Keep taking it unless your care team tells you tostop. Apply a thin film to the affected area and rub gently. Do not bandage or wrapthe skin being treated unless directed to do so by your care team. Talk to your care team about the use of this medication in children. Specialcare may be  needed. Overdosage: If you think you have taken too much of this medicine contact apoison control center or emergency room at once. NOTE: This medicine is only for you. Do not share this medicine with others. What if I miss a dose? If you miss a dose, use it as soon as you can. If it is almost time for yournext dose, use only that dose. Do not use double or extra doses. What may interact with this medication? Interactions are not expected. This list may not describe all possible interactions. Give your health care provider a list of all the medicines, herbs, non-prescription drugs, or dietary supplements you use. Also tell them if you smoke, drink alcohol, or use illegaldrugs. Some items may interact with your medicine. What should I watch for while using this medication? Visit your health care provider for regular checks on your progress. Tell your health care provider if your symptoms do not start to get better or if they getworse. Call your health care provider if you are around anyone with measles,chickenpox, or if you develop sores or blisters that do not heal properly. Tell your health care provider if your symptoms do not start to get better within one week. Do not use on healthy skin or over large areas of skin. Tell your health care provider if you are exposed to anyone with measles orchickenpox, or if you develop sores or blisters that do not heal properly. Do not use an airtight bandage to cover the affected area unless your health care provider tells you to. If you are to cover the  area, follow the instructions carefully. Covering the area where the medication is applied can increase the amount that passes through the skin and increases the risk of sideeffects. If treating the diaper area of a child, avoid covering the treated area with tight-fitting diapers or plastic pants. This may increase the amount of medication that passes through the skin and increase the risk of serious  sideeffects. What side effects may I notice from receiving this medication? Side effects that you should report to your care team as soon as possible: Allergic reactions-skin rash, itching, hives, swelling of the face, lips, tongue, or throat Burning, itching, crusting, or peeling of treated skin Fragile or thinning skin that bruises easily Small, red, pus-filled bumps on skin around hair follicles Skin infection-skin redness, swelling, warmth, or pain Side effects that usually do not require medical attention (report to your careteam if they continue or are bothersome): Mild skin irritation, redness, or dryness Unexpected hair growth at site where applied This list may not describe all possible side effects. Call your doctor for medical advice about side effects. You may report side effects to FDA at1-800-FDA-1088. Where should I keep my medication? Keep out of the reach of children and pets. Store at room temperature between 15 and 30 degrees C (59 and 86 degrees F). Do not freeze. Get rid of any unused medication after the expiration date. Thismedication is flammable. Avoid exposure to heat, fire, flame, and smoking. To get rid of medications that are no longer needed or expired: Take the medication to a medication take-back program. Check with your pharmacy or law enforcement to find a location. If you cannot return the medication, check the label or package insert to see if the medication should be thrown out in the garbage or flushed down the toilet. If you are not sure, ask your care team. If it is safe to put in the trash, empty the medication out of the container. Mix the medication with cat litter, dirt, coffee grounds, or other unwanted substance. Seal the mixture in a bag or container. Put it in the trash. NOTE: This sheet is a summary. It may not cover all possible information. If you have questions about this medicine, talk to your doctor, pharmacist, orhealth care provider.  2022  Elsevier/Gold Standard (2020-09-02 12:42:49)

## 2021-03-22 ENCOUNTER — Ambulatory Visit (INDEPENDENT_AMBULATORY_CARE_PROVIDER_SITE_OTHER): Payer: 59

## 2021-03-22 ENCOUNTER — Other Ambulatory Visit: Payer: Self-pay

## 2021-03-22 DIAGNOSIS — N958 Other specified menopausal and perimenopausal disorders: Secondary | ICD-10-CM

## 2021-03-22 DIAGNOSIS — Z1231 Encounter for screening mammogram for malignant neoplasm of breast: Secondary | ICD-10-CM | POA: Diagnosis not present

## 2021-03-27 ENCOUNTER — Ambulatory Visit (INDEPENDENT_AMBULATORY_CARE_PROVIDER_SITE_OTHER): Payer: 59 | Admitting: Orthopedic Surgery

## 2021-03-27 ENCOUNTER — Other Ambulatory Visit: Payer: Self-pay

## 2021-03-27 DIAGNOSIS — M25512 Pain in left shoulder: Secondary | ICD-10-CM

## 2021-04-03 ENCOUNTER — Encounter: Payer: Self-pay | Admitting: Orthopedic Surgery

## 2021-04-03 NOTE — Progress Notes (Signed)
Office Visit Note   Patient: Rebecca Rojas           Date of Birth: February 29, 1956           MRN: [id number] Visit Date: 03/27/2021 Requested by: Blane Ohara, MD [address] [place],  [place] [address] PCP: Blane Ohara, MD  Subjective: Chief Complaint  Patient presents with   Other    Scan review    HPI: [name] is a 65 year old patient with left shoulder pain.  She has had 8 weeks of symptoms.  She fell up the stairs.  She does not have diabetes.  States her pain is getting worse.  She had a heart ablation and she takes Eliquis.  Reports a dull pain.  Hard for her to lift a gallon of milk.  MRI scan is reviewed.  Patient has a very small punctate hole in her infraspinatus rotator cuff attachment.  No retracted tendon tear.  No atrophy.              ROS: All systems reviewed are negative as they relate to the chief complaint within the history of present illness.  Patient denies  fevers or chills.   Assessment & Plan: Visit Diagnoses:  1. Left shoulder pain, unspecified chronicity     Plan: Impression is nonretracted rotator cuff tear which looks to be more degenerative in nature.  She did have an injury but at this time with her being on Eliquis and how the tear looks on MRI scan and her relative good strength of the rotator cuff I think it would be worth trying a home exercise program in 35-month return.  There is possibility that this could heal and become less symptomatic.  We will see how she is in 3 months and then decide definitively for her again surgical intervention at that time.  Follow-Up Instructions: Return in about 3 months (around 06/27/2021).   Orders:  No orders of the defined types were placed in this encounter.  No orders of the defined types were placed in this encounter.     Procedures: No procedures performed   Clinical Data: No additional findings.  Objective: Vital Signs: There were no vitals taken for this visit.  Physical Exam:    Constitutional: Patient appears well-developed HEENT:  Head: Normocephalic Eyes:EOM are normal Neck: Normal range of motion Cardiovascular: Normal rate Pulmonary/chest: Effort normal Neurologic: Patient is alert Skin: Skin is warm Psychiatric: Patient has normal mood and affect   Ortho Exam: Ortho exam demonstrates good cervical spine range of motion.  5 out of 5 grip EPL FPL interosseous wrist flexion extension bicep triceps and deltoid strength.  No restriction of passive range of motion on the left compared to the right.  Pretty reasonable rotator cuff strength testing infraspinatus and supraspinatus with not too much coarse grinding or crepitus but she does have pain when I get her up around 90 degrees of AB duction.  No discrete AC joint tenderness on the left.  Specialty Comments:  No specialty comments available.  Imaging: No results found.   PMFS History: Patient Active Problem List   Diagnosis Date Noted   Headache 03/02/2021   Allergic rhinitis due to animal (cat) (dog) hair and dander 02/15/2021   Allergic rhinitis due to pollen 02/15/2021   Chronic allergic conjunctivitis 02/15/2021   Mild persistent asthma, uncomplicated 02/15/2021   Allergic rhinitis 02/15/2021   Sleep apnea with use of continuous positive airway pressure (CPAP) 02/15/2021   Annual physical exam  02/15/2021   Vitamin D insufficiency 02/15/2021   Other chest pain 02/15/2021   Need for shingles vaccine 02/15/2021   Encounter for screening mammogram for breast cancer 02/15/2021   Other specified menopausal and perimenopausal disorders 02/15/2021   Hypertensive heart disease 06/02/2020   Shortness of breath    Osteoarthritis    Lower extremity edema    Joint pain    History of seasonal allergies    Back pain    Arthritis    Anemia    Allergy-induced asthma    Allergy    Atrial fibrillation (HCC) 05/20/2020   Sleep apnea 05/20/2020   Atrial fibrillation with RVR (HCC) 05/20/2020   Asthma  with exacerbation 04/27/2020   Acute laryngopharyngitis 01/13/2020   Insulin resistance 10/01/2019   Rotator cuff tear, right 06/15/2019   Glenoid labral tear, right, initial encounter 06/15/2019   Arthritis of right acromioclavicular joint 06/15/2019   Bilateral lower extremity edema 08/04/2018   Unspecified open wound, left lower leg, initial encounter 08/04/2018   Pharyngitis 05/23/2018   Sore throat 05/11/2018   Persistent cough for 3 weeks or longer 10/11/2017   Acute non-recurrent frontal sinusitis 09/22/2017   Vertigo 07/18/2017   Hordeolum internum of right lower eyelid 02/14/2017   Pain of right eye 02/14/2017   Healthcare maintenance 01/08/2017   Encounter for screening colonoscopy 01/08/2017   Skin lesion of foot 08/30/2016   Pain of right foot 08/30/2016   Morbid obesity (HCC) 12/28/2015   Vitamin D deficiency 12/28/2015   Asthma 12/28/2015   Benign essential hypertension 12/28/2015   High risk medication use 12/28/2015   Migraine headache 12/28/2015   Mixed hyperlipidemia 12/28/2015   Myalgia 12/28/2015   Lower respiratory infection (e.g., bronchitis, pneumonia, pneumonitis, pulmonitis) 05/10/2009   Past Medical History:  Diagnosis Date   Acute laryngopharyngitis 01/13/2020   Acute non-recurrent frontal sinusitis 09/22/2017   Allergic rhinitis 02/15/2021   Allergic rhinitis due to animal (cat) (dog) hair and dander 02/15/2021   Allergic rhinitis due to pollen 02/15/2021   Allergy    dust, environmental, feathers, has epipen, prn   Allergy-induced asthma    Anemia    Annual physical exam 02/15/2021   Arthritis    osteoarthritis   Arthritis of right acromioclavicular joint 06/15/2019   Asthma 12/28/2015   Asthma with exacerbation 04/27/2020   Atrial fibrillation (HCC) 05/20/2020   Atrial fibrillation with RVR (HCC) 05/20/2020   Back pain    Benign essential hypertension 12/28/2015   Bilateral lower extremity edema 08/04/2018   Chronic allergic conjunctivitis  02/15/2021   Encounter for screening colonoscopy 01/08/2017   Encounter for screening mammogram for breast cancer 02/15/2021   Glenoid labral tear, right, initial encounter 06/15/2019   Headache    Healthcare maintenance 01/08/2017   High risk medication use 12/28/2015   History of seasonal allergies    Hordeolum internum of right lower eyelid 02/14/2017   Hypertensive heart disease 06/02/2020   Insulin resistance 10/01/2019   Joint pain    Lower extremity edema    Lower respiratory infection (e.g., bronchitis, pneumonia, pneumonitis, pulmonitis) 05/10/2009   Qualifier: Diagnosis of  By: Shelle Iron MD, Maree Krabbe    Migraine headache 12/28/2015   Mild persistent asthma, uncomplicated 02/15/2021   Mixed hyperlipidemia 12/28/2015   Morbid obesity (HCC)    Myalgia 12/28/2015   Need for shingles vaccine 02/15/2021   Osteoarthritis    Other chest pain 02/15/2021   Other specified menopausal and perimenopausal disorders 02/15/2021   Pain of right eye 02/14/2017  Pain of right foot 08/30/2016   Persistent cough for 3 weeks or longer 10/11/2017   Pharyngitis 05/23/2018   Rotator cuff tear, right 06/15/2019   Shortness of breath    maybe with exertion   Skin lesion of foot 08/30/2016   Sleep apnea    uses cpap   Sleep apnea with use of continuous positive airway pressure (CPAP) 02/15/2021   Sore throat 05/11/2018   Unspecified open wound, left lower leg, initial encounter 08/04/2018   Vertigo    Vitamin D deficiency     Family History  Problem Relation Age of Onset   Hypertension Mother    Heart disease Mother    Heart attack Mother    Hyperlipidemia Mother    Stroke Mother    Obesity Mother    Cancer Father    Liver disease Father    Obesity Father    Anesthesia problems Neg Hx    Hypotension Neg Hx    Malignant hyperthermia Neg Hx    Pseudochol deficiency Neg Hx    Colon cancer Neg Hx    Colon polyps Neg Hx    Esophageal cancer Neg Hx    Rectal cancer Neg Hx    Stomach cancer  Neg Hx     Past Surgical History:  Procedure Laterality Date   ABDOMINAL HYSTERECTOMY     COLONOSCOPY  09/2010   normal   FRACTURE SURGERY     left 5th finger fx with bone graft repair   HEEL SPUR EXCISION     JOINT REPLACEMENT     KNEE ARTHROSCOPY Left    Left Knee lateral release     LIPOMA EXCISION     SHOULDER ARTHROSCOPY WITH ROTATOR CUFF REPAIR AND SUBACROMIAL DECOMPRESSION Right 06/15/2019   Procedure: SHOULDER ARTHROSCOPY WITH ROTATOR CUFF REPAIR AND SUBACROMIAL DECOMPRESSION;  Surgeon: Jodi Geralds, MD;  Location: [facility];  Service: Orthopedics;  Laterality: Right;   SVT ABLATION N/A 12/06/2020   Procedure: SVT ABLATION;  Surgeon: Regan Lemming, MD;  Location: [facility];  Service: Cardiovascular;  Laterality: N/A;   TONSILLECTOMY     TOTAL KNEE ARTHROPLASTY  08/03/2011   Procedure: TOTAL KNEE ARTHROPLASTY;  Surgeon: Harvie Junior;  Location: [facility];  Service: Orthopedics;  Laterality: Right;  COMPUTER ASSISTED TOTAL KNEE REPLACEMENTwith revision tibial component   TUBAL LIGATION     Social History   Occupational History   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  Tobacco Use   Smoking status: Never   Smokeless tobacco: Never  Vaping Use   Vaping Use: Never used  Substance and Sexual Activity   Alcohol use: Not Currently   Drug use: Never   Sexual activity: Yes

## 2021-04-25 ENCOUNTER — Ambulatory Visit (INDEPENDENT_AMBULATORY_CARE_PROVIDER_SITE_OTHER): Payer: 59 | Admitting: Family Medicine

## 2021-04-25 ENCOUNTER — Other Ambulatory Visit: Payer: Self-pay

## 2021-04-25 DIAGNOSIS — Z23 Encounter for immunization: Secondary | ICD-10-CM | POA: Diagnosis not present

## 2021-06-06 ENCOUNTER — Encounter: Payer: Self-pay | Admitting: Nurse Practitioner

## 2021-06-06 ENCOUNTER — Other Ambulatory Visit: Payer: Self-pay

## 2021-06-06 ENCOUNTER — Ambulatory Visit (INDEPENDENT_AMBULATORY_CARE_PROVIDER_SITE_OTHER): Payer: 59 | Admitting: Nurse Practitioner

## 2021-06-06 VITALS — BP 138/90 | HR 78 | Temp 97.3°F | Ht 61.0 in | Wt 278.0 lb

## 2021-06-06 DIAGNOSIS — M5441 Lumbago with sciatica, right side: Secondary | ICD-10-CM | POA: Diagnosis not present

## 2021-06-06 MED ORDER — PREDNISONE 20 MG PO TABS: 20.0000 mg | ORAL_TABLET | Freq: Every day | ORAL | 0 refills | Status: DC

## 2021-06-06 MED ORDER — TRIAMCINOLONE ACETONIDE 40 MG/ML IJ SUSP
60.0000 mg | Freq: Once | INTRAMUSCULAR | Status: AC
  Administered 2021-06-06: 60 mg via INTRAMUSCULAR

## 2021-06-06 MED ORDER — CYCLOBENZAPRINE HCL 10 MG PO TABS: 10.0000 mg | ORAL_TABLET | Freq: Three times a day (TID) | ORAL | 0 refills | Status: DC | PRN

## 2021-06-06 NOTE — Patient Instructions (Signed)
Take Prednisone as prescribed Take Flexeril 10 mg three times daily as needed Perform back exercises as tolerated daily Notify office if symptoms fail to improve or worsen Follow-up as needed  Sciatica Sciatica is pain, weakness, tingling, or loss of feeling (numbness) along the sciatic nerve. The sciatic nerve starts in the lower back and goes down the back of each leg. Sciatica usually goes away on its own or with treatment. Sometimes, sciatica may come back (recur). What are the causes? This condition happens when the sciatic nerve is pinched or has pressure put on it. This may be the result of: A disk in between the bones of the spine bulging out too far (herniated disk). Changes in the spinal disks that occur with aging. A condition that affects a muscle in the butt. Extra bone growth near the sciatic nerve. A break (fracture) of the area between your hip bones (pelvis). Pregnancy. Tumor. This is rare. What increases the risk? You are more likely to develop this condition if you: Play sports that put pressure or stress on the spine. Have poor strength and ease of movement (flexibility). Have had a back injury in the past. Have had back surgery. Sit for long periods of time. Do activities that involve bending or lifting over and over again. Are very overweight (obese). What are the signs or symptoms? Symptoms can vary from mild to very bad. They may include: Any of these problems in the lower back, leg, hip, or butt: Mild tingling, loss of feeling, or dull aches. Burning sensations. Sharp pains. Loss of feeling in the back of the calf or the sole of the foot. Leg weakness. Very bad back pain that makes it hard to move. These symptoms may get worse when you cough, sneeze, or laugh. They may also get worse when you sit or stand for long periods of time. How is this treated? This condition often gets better without any treatment. However, treatment may include: Changing or  cutting back on physical activity when you have pain. Doing exercises and stretching. Putting ice or heat on the affected area. Medicines that help: To relieve pain and swelling. To relax your muscles. Shots (injections) of medicines that help to relieve pain, irritation, and swelling. Surgery. Follow these instructions at home: Medicines Take over-the-counter and prescription medicines only as told by your doctor. Ask your doctor if the medicine prescribed to you: Requires you to avoid driving or using heavy machinery. Can cause trouble pooping (constipation). You may need to take these steps to prevent or treat trouble pooping: Drink enough fluids to keep your pee (urine) pale yellow. Take over-the-counter or prescription medicines. Eat foods that are high in fiber. These include beans, whole grains, and fresh fruits and vegetables. Limit foods that are high in fat and sugar. These include fried or sweet foods. Managing pain   If told, put ice on the affected area. Put ice in a plastic bag. Place a towel between your skin and the bag. Leave the ice on for 20 minutes, 2-3 times a day. If told, put heat on the affected area. Use the heat source that your doctor tells you to use, such as a moist heat pack or a heating pad. Place a towel between your skin and the heat source. Leave the heat on for 20-30 minutes. Remove the heat if your skin turns bright red. This is very important if you are unable to feel pain, heat, or cold. You may have a greater risk of getting burned.  Activity  Return to your normal activities as told by your doctor. Ask your doctor what activities are safe for you. Avoid activities that make your symptoms worse. Take short rests during the day. When you rest for a long time, do some physical activity or stretching between periods of rest. Avoid sitting for a long time without moving. Get up and move around at least one time each hour. Exercise and stretch  regularly, as told by your doctor. Do not lift anything that is heavier than 10 lb (4.5 kg) while you have symptoms of sciatica. Avoid lifting heavy things even when you do not have symptoms. Avoid lifting heavy things over and over. When you lift objects, always lift in a way that is safe for your body. To do this, you should: Bend your knees. Keep the object close to your body. Avoid twisting. General instructions Stay at a healthy weight. Wear comfortable shoes that support your feet. Avoid wearing high heels. Avoid sleeping on a mattress that is too soft or too hard. You might have less pain if you sleep on a mattress that is firm enough to support your back. Keep all follow-up visits as told by your doctor. This is important. Contact a doctor if: You have pain that: Wakes you up when you are sleeping. Gets worse when you lie down. Is worse than the pain you have had in the past. Lasts longer than 4 weeks. You lose weight without trying. Get help right away if: You cannot control when you pee (urinate) or poop (have a bowel movement). You have weakness in any of these areas and it gets worse: Lower back. The area between your hip bones. Butt. Legs. You have redness or swelling of your back. You have a burning feeling when you pee. Summary Sciatica is pain, weakness, tingling, or loss of feeling (numbness) along the sciatic nerve. This condition happens when the sciatic nerve is pinched or has pressure put on it. Sciatica can cause pain, tingling, or loss of feeling (numbness) in the lower back, legs, hips, and butt. Treatment often includes rest, exercise, medicines, and putting ice or heat on the affected area. This information is not intended to replace advice given to you by your health care provider. Make sure you discuss any questions you have with your health care provider. Document Revised: 08/25/2018 Document Reviewed: 08/25/2018 [employer] Patient Education  2022  [employer] Sciatica Rehab Ask your health care provider which exercises are safe for you. Do exercises exactly as told by your health care provider and adjust them as directed. It is normal to feel mild stretching, pulling, tightness, or discomfort as you do these exercises. Stop right away if you feel sudden pain or your pain gets worse. Do not begin these exercises until told by your health care provider. Stretching and range-of-motion exercises These exercises warm up your muscles and joints and improve the movement and flexibility of your hips and back. These exercises also help to relieve pain, numbness, and tingling. Sciatic nerve glide Sit in a chair with your head facing down toward your chest. Place your hands behind your back. Let your shoulders slump forward. Slowly straighten one of your legs while you tilt your head back as if you are looking toward the ceiling. Only straighten your leg as far as you can without making your symptoms worse. Hold this position for __________ seconds. Slowly return to the starting position. Repeat with your other leg. Repeat __________ times. Complete this exercise __________ times  a day. Knee to chest with hip adduction and internal rotation  Lie on your back on a firm surface with both legs straight. Bend one of your knees and move it up toward your chest until you feel a gentle stretch in your lower back and buttock. Then, move your knee toward the shoulder that is on the opposite side from your leg. This is hip adduction and internal rotation. Hold your leg in this position by holding on to the front of your knee. Hold this position for __________ seconds. Slowly return to the starting position. Repeat with your other leg. Repeat __________ times. Complete this exercise __________ times a day. Prone extension on elbows  Lie on your abdomen on a firm surface. A bed may be too soft for this exercise. Prop yourself up on your elbows. Use your  arms to help lift your chest up until you feel a gentle stretch in your abdomen and your lower back. This will place some of your body weight on your elbows. If this is uncomfortable, try stacking pillows under your chest. Your hips should stay down, against the surface that you are lying on. Keep your hip and back muscles relaxed. Hold this position for __________ seconds. Slowly relax your upper body and return to the starting position. Repeat __________ times. Complete this exercise __________ times a day. Strengthening exercises These exercises build strength and endurance in your back. Endurance is the ability to use your muscles for a long time, even after they get tired. Pelvic tilt This exercise strengthens the muscles that lie deep in the abdomen. Lie on your back on a firm surface. Bend your knees and keep your feet flat on the floor. Tense your abdominal muscles. Tip your pelvis up toward the ceiling and flatten your lower back into the floor. To help with this exercise, you may place a small towel under your lower back and try to push your back into the towel. Hold this position for __________ seconds. Let your muscles relax completely before you repeat this exercise. Repeat __________ times. Complete this exercise __________ times a day. Alternating arm and leg raises  Get on your hands and knees on a firm surface. If you are on a hard floor, you may want to use padding, such as an exercise mat, to cushion your knees. Line up your arms and legs. Your hands should be directly below your shoulders, and your knees should be directly below your hips. Lift your left leg behind you. At the same time, raise your right arm and straighten it in front of you. Do not lift your leg higher than your hip. Do not lift your arm higher than your shoulder. Keep your abdominal and back muscles tight. Keep your hips facing the ground. Do not arch your back. Keep your balance carefully, and do not  hold your breath. Hold this position for __________ seconds. Slowly return to the starting position. Repeat with your right leg and your left arm. Repeat __________ times. Complete this exercise __________ times a day. Posture and body mechanics Good posture and healthy body mechanics can help to relieve stress in your body's tissues and joints. Body mechanics refers to the movements and positions of your body while you do your daily activities. Posture is part of body mechanics. Good posture means: Your spine is in its natural S-curve position (neutral). Your shoulders are pulled back slightly. Your head is not tipped forward. Follow these guidelines to improve your posture and body mechanics in  your everyday activities. Standing  When standing, keep your spine neutral and your feet about hip width apart. Keep a slight bend in your knees. Your ears, shoulders, and hips should line up. When you do a task in which you stand in one place for a long time, place one foot up on a stable object that is 2-4 inches (5-10 cm) high, such as a footstool. This helps keep your spine neutral. Sitting  When sitting, keep your spine neutral and keep your feet flat on the floor. Use a footrest, if necessary, and keep your thighs parallel to the floor. Avoid rounding your shoulders, and avoid tilting your head forward. When working at a desk or a computer, keep your desk at a height where your hands are slightly lower than your elbows. Slide your chair under your desk so you are close enough to maintain good posture. When working at a computer, place your monitor at a height where you are looking straight ahead and you do not have to tilt your head forward or downward to look at the screen. Resting When lying down and resting, avoid positions that are most painful for you. If you have pain with activities such as sitting, bending, stooping, or squatting, lie in a position in which your body does not bend very  much. For example, avoid curling up on your side with your arms and knees near your chest (fetal position). If you have pain with activities such as standing for a long time or reaching with your arms, lie with your spine in a neutral position and bend your knees slightly. Try the following positions: Lying on your side with a pillow between your knees. Lying on your back with a pillow under your knees. Lifting  When lifting objects, keep your feet at least shoulder width apart and tighten your abdominal muscles. Bend your knees and hips and keep your spine neutral. It is important to lift using the strength of your legs, not your back. Do not lock your knees straight out. Always ask for help to lift heavy or awkward objects. This information is not intended to replace advice given to you by your health care provider. Make sure you discuss any questions you have with your health care provider. Document Revised: 11/28/2018 Document Reviewed: 08/28/2018 [employer] Patient Education  2022 [employer].

## 2021-06-06 NOTE — Progress Notes (Signed)
Acute Office Visit  Subjective:    Patient ID: Rebecca Rojas, adult    DOB: September 08, 1955, 65 y.o.   MRN: [id number]  Chief Complaint  Patient presents with  . Back Pain    HPI: Patient is in today for Back Pain  She reports new onset back pain. There was not an injury that may have caused the pain. The most recent episode started  4 days ago and is staying constant. The pain is located in the right gluteal areato the right foot. It is described as sharp, is severe in intensity, occurring constantly. Symptoms are worse in the: all day.  Aggravating factors: bending forwards, sitting, standing, and walking Relieving factors: none. Right foot is numb. She has tried application of heat, application of ice, and acetaminophen with no relief.   Associated symptoms: No abdominal pain No bowel incontinence  No chest pain No dysuria   No fever No headaches  No joint pains No pelvic pain  No weakness in leg  No tingling in lower extremities  No urinary incontinence No weight loss    Past Medical History:  Diagnosis Date  . Acute laryngopharyngitis 01/13/2020  . Acute non-recurrent frontal sinusitis 09/22/2017  . Allergic rhinitis 02/15/2021  . Allergic rhinitis due to animal (cat) (dog) hair and dander 02/15/2021  . Allergic rhinitis due to pollen 02/15/2021  . Allergy    dust, environmental, feathers, has epipen, prn  . Allergy-induced asthma   . Anemia   . Annual physical exam 02/15/2021  . Arthritis    osteoarthritis  . Arthritis of right acromioclavicular joint 06/15/2019  . Asthma 12/28/2015  . Asthma with exacerbation 04/27/2020  . Atrial fibrillation ([place]) 05/20/2020  . Atrial fibrillation with RVR ([place]) 05/20/2020  . Back pain   . Benign essential hypertension 12/28/2015  . Bilateral lower extremity edema 08/04/2018  . Chronic allergic conjunctivitis 02/15/2021  . Encounter for screening colonoscopy 01/08/2017  . Encounter for screening mammogram for breast cancer  02/15/2021  . Glenoid labral tear, right, initial encounter 06/15/2019  . Headache   . Healthcare maintenance 01/08/2017  . High risk medication use 12/28/2015  . History of seasonal allergies   . Hordeolum internum of right lower eyelid 02/14/2017  . Hypertensive heart disease 06/02/2020  . Insulin resistance 10/01/2019  . Joint pain   . Lower extremity edema   . Lower respiratory infection (e.g., bronchitis, pneumonia, pneumonitis, pulmonitis) 05/10/2009   Qualifier: Diagnosis of  By: Gwenette Greet MD, Armando Reichert   . Migraine headache 12/28/2015  . Mild persistent asthma, uncomplicated 0/34/0352  . Mixed hyperlipidemia 12/28/2015  . Morbid obesity ([place])   . Myalgia 12/28/2015  . Need for shingles vaccine 02/15/2021  . Osteoarthritis   . Other chest pain 02/15/2021  . Other specified menopausal and perimenopausal disorders 02/15/2021  . Pain of right eye 02/14/2017  . Pain of right foot 08/30/2016  . Persistent cough for 3 weeks or longer 10/11/2017  . Pharyngitis 05/23/2018  . Rotator cuff tear, right 06/15/2019  . Shortness of breath    maybe with exertion  . Skin lesion of foot 08/30/2016  . Sleep apnea    uses cpap  . Sleep apnea with use of continuous positive airway pressure (CPAP) 02/15/2021  . Sore throat 05/11/2018  . Unspecified open wound, left lower leg, initial encounter 08/04/2018  . Vertigo   . Vitamin D deficiency     Past Surgical History:  Procedure Laterality Date  . ABDOMINAL HYSTERECTOMY    . COLONOSCOPY  09/2010   normal  . FRACTURE SURGERY     left 5th finger fx with bone graft repair  . HEEL SPUR EXCISION    . JOINT REPLACEMENT    . KNEE ARTHROSCOPY Left   . Left Knee lateral release    . LIPOMA EXCISION    . SHOULDER ARTHROSCOPY WITH ROTATOR CUFF REPAIR AND SUBACROMIAL DECOMPRESSION Right 06/15/2019   Procedure: SHOULDER ARTHROSCOPY WITH ROTATOR CUFF REPAIR AND SUBACROMIAL DECOMPRESSION;  Surgeon: Dorna Leitz, MD;  Location: [facility];  Service:  Orthopedics;  Laterality: Right;  . SVT ABLATION N/A 12/06/2020   Procedure: SVT ABLATION;  Surgeon: Constance Haw, MD;  Location: [facility];  Service: Cardiovascular;  Laterality: N/A;  . TONSILLECTOMY    . TOTAL KNEE ARTHROPLASTY  08/03/2011   Procedure: TOTAL KNEE ARTHROPLASTY;  Surgeon: Alta Corning;  Location: [facility];  Service: Orthopedics;  Laterality: Right;  COMPUTER ASSISTED TOTAL KNEE REPLACEMENTwith revision tibial component  . TUBAL LIGATION      Family History  Problem Relation Age of Onset  . Hypertension Mother   . Heart disease Mother   . Heart attack Mother   . Hyperlipidemia Mother   . Stroke Mother   . Obesity Mother   . Cancer Father   . Liver disease Father   . Obesity Father   . Anesthesia problems Neg Hx   . Hypotension Neg Hx   . Malignant hyperthermia Neg Hx   . Pseudochol deficiency Neg Hx   . Colon cancer Neg Hx   . Colon polyps Neg Hx   . Esophageal cancer Neg Hx   . Rectal cancer Neg Hx   . Stomach cancer Neg Hx     Social History   Socioeconomic History  . Marital status: Married    Spouse name: Bennye Nix  . Number of children: 2  . Years of education: Not on file  . Highest education level: Not on file  Occupational History  . 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 kits  Tobacco Use  . Smoking status: Never  . Smokeless tobacco: Never  Vaping Use  . Vaping Use: Never used  Substance and Sexual Activity  . Alcohol use: Not Currently  . Drug use: Never  . Sexual activity: Yes  Other Topics Concern  . Not on file  Social History Narrative  . Not on file   Social Determinants of Health   Financial Resource Strain: Not on file  Food Insecurity: Not on file  Transportation Needs: Not on file  Physical Activity: Not on file  Stress: Not on file  Social Connections: Not on file  Intimate Partner Violence: Not on file    Outpatient Medications Prior to Visit  Medication Sig Dispense Refill  . acetaminophen  (TYLENOL) 500 MG tablet Take 1,000 mg by mouth every 6 (six) hours as needed for moderate pain.    Marland Kitchen albuterol (PROVENTIL HFA;VENTOLIN HFA) 108 (90 BASE) MCG/ACT inhaler Inhale 1-2 puffs into the lungs every 6 (six) hours as needed for wheezing or shortness of breath.     Marland Kitchen apixaban (ELIQUIS) 5 MG TABS tablet Take 1 tablet (5 mg total) by mouth 2 (two) times daily. 180 tablet 3  . azelastine (OPTIVAR) 0.05 % ophthalmic solution Place 1 drop into both eyes 2 (two) times daily as needed for allergies (allergies).    . cyclobenzaprine (FLEXERIL) 10 MG tablet Take 1 tablet (10 mg total) by mouth 3 (three) times daily as needed for muscle spasms. 30 tablet 1  .  diltiazem (TIAZAC) 180 MG 24 hr capsule Take 1 capsule (180 mg total) by mouth daily. 90 capsule 3  . EPINEPHrine 0.3 mg/0.3 mL IJ SOAJ injection Inject 0.3 mg into the muscle as needed for anaphylaxis.    . fluticasone furoate-vilanterol (BREO ELLIPTA) 100-25 MCG/INH AEPB Inhale 1 puff into the lungs daily.    . furosemide (LASIX) 20 MG tablet Take 40 mg by mouth daily as needed for edema (fluid retention.).    Marland Kitchen ipratropium (ATROVENT) 0.02 % nebulizer solution Take 3 mLs by nebulization every 6 (six) hours as needed for wheezing or shortness of breath.    . levocetirizine (XYZAL) 5 MG tablet Take 5 mg by mouth every evening.    . Magnesium 500 MG CAPS Take 500 mg by mouth daily.    . metoprolol succinate (TOPROL XL) 25 MG 24 hr tablet Take 1 tablet (25 mg total) by mouth daily. 90 tablet 3  . mometasone (NASONEX) 50 MCG/ACT nasal spray Place 2 sprays into the nose daily. 17 g 12  . montelukast (SINGULAIR) 10 MG tablet Take 10 mg by mouth at bedtime.    Marland Kitchen PRESCRIPTION MEDICATION Inject 1 kit into the skin every 7 (seven) days. Allergy shot unknown to pt    . triamcinolone cream (KENALOG) 0.1 % Apply 1 application topically 2 (two) times daily. 30 g 0  . Vitamin D, Ergocalciferol, (DRISDOL) 1.25 MG (50000 UNIT) CAPS capsule Take 1 capsule (50,000  Units total) by mouth every 7 (seven) days. 12 capsule 3   No facility-administered medications prior to visit.    Allergies  Allergen Reactions  . Sulfa Antibiotics Itching  . Cefdinir Itching  . Erythromycin   . Hydrocodone Bit-Homatrop Mbr Rash    Review of Systems  Constitutional:  Negative for chills, diaphoresis, fatigue and fever.  HENT:  Negative for congestion, ear pain and sore throat.   Eyes: Negative.   Respiratory:  Negative for cough and shortness of breath.   Cardiovascular:  Negative for chest pain and leg swelling.  Gastrointestinal: Negative.   Endocrine: Negative.   Genitourinary: Negative.  Negative for dysuria.  Musculoskeletal:  Positive for arthralgias and back pain.  Skin: Negative.   Allergic/Immunologic: Negative.   Neurological: Negative.   Hematological: Negative.   Psychiatric/Behavioral: Negative.        Objective:    Physical Exam Vitals reviewed.  Constitutional:      Appearance: Normal appearance.  HENT:     Head: Normocephalic.     Right Ear: Tympanic membrane normal.     Left Ear: Tympanic membrane normal.     Nose: Nose normal.     Mouth/Throat:     Mouth: Mucous membranes are moist.  Eyes:     Pupils: Pupils are equal, round, and reactive to light.  Cardiovascular:     Rate and Rhythm: Normal rate and regular rhythm.     Pulses: Normal pulses.     Heart sounds: Normal heart sounds.  Pulmonary:     Effort: Pulmonary effort is normal.     Breath sounds: Normal breath sounds.  Abdominal:     General: Bowel sounds are normal.     Palpations: Abdomen is soft.  Musculoskeletal:        General: Tenderness present.     Cervical back: Normal and neck supple.     Thoracic back: Normal.     Lumbar back: Spasms and tenderness present. Decreased range of motion. Positive right straight leg raise test. Negative left straight leg raise  test.  Skin:    General: Skin is warm and dry.     Capillary Refill: Capillary refill takes less  than 2 seconds.  Neurological:     General: No focal deficit present.     Mental Status: She is alert and oriented to person, place, and time.  Psychiatric:        Mood and Affect: Mood normal.        Behavior: Behavior normal.    BP 138/90   Pulse 78   Temp (!) 97.3 F (36.3 C)   Ht _0  (1.549 m)   Wt 278 lb (126.1 kg)   SpO2 98%   BMI 52.53 kg/m   Wt Readings from Last 3 Encounters:  06/06/21 278 lb (126.1 kg)  03/21/21 274 lb 12.8 oz (124.6 kg)  03/03/21 274 lb 9.6 oz (124.6 kg)     Lab Results  Component Value Date   TSH 4.450 02/13/2021   Lab Results  Component Value Date   WBC 7.9 02/13/2021   HGB 13.3 02/13/2021   HCT 41.0 02/13/2021   MCV 90 02/13/2021   PLT 316 02/13/2021   Lab Results  Component Value Date   NA 143 02/13/2021   K 4.4 02/13/2021   CO2 24 02/13/2021   GLUCOSE 105 (H) 02/13/2021   BUN 13 02/13/2021   CREATININE 0.71 02/13/2021   BILITOT 0.5 02/13/2021   ALKPHOS 84 02/13/2021   AST 14 02/13/2021   ALT 17 02/13/2021   PROT 6.3 02/13/2021   ALBUMIN 3.7 (L) 02/13/2021   CALCIUM 8.6 (L) 02/13/2021   EGFR 95 02/13/2021   Lab Results  Component Value Date   CHOL 180 02/13/2021   Lab Results  Component Value Date   HDL 41 02/13/2021   Lab Results  Component Value Date   LDLCALC 110 (H) 02/13/2021   Lab Results  Component Value Date   TRIG 165 (H) 02/13/2021   Lab Results  Component Value Date   CHOLHDL 4.4 02/13/2021   Lab Results  Component Value Date   HGBA1C 5.5 02/13/2021       Assessment & Plan:   1. Acute right-sided low back pain with right-sided sciatica - triamcinolone acetonide (KENALOG-40) injection 60 mg - predniSONE (DELTASONE) 20 MG tablet; Take 1 tablet (20 mg total) by mouth daily with breakfast. 1 po tid for 3 days then 1 po bid for 3 days then 1 po qd for 3 days  Dispense: 18 tablet; Refill: 0 - cyclobenzaprine (FLEXERIL) 10 MG tablet; Take 1 tablet (10 mg total) by mouth 3 (three) times daily as  needed for muscle spasms.  Dispense: 30 tablet; Refill: 0   Take Prednisone as prescribed Take Flexeril 10 mg three times daily as needed Perform back exercises as tolerated daily Notify office if symptoms fail to improve or worsen Follow-up as needed    Follow-up: PRN  An After Visit Summary was printed and given to the patient.  Rip Harbour, NP [place] [phone]

## 2021-06-09 ENCOUNTER — Other Ambulatory Visit: Payer: Self-pay | Admitting: Nurse Practitioner

## 2021-06-09 ENCOUNTER — Other Ambulatory Visit: Payer: Self-pay

## 2021-06-09 ENCOUNTER — Encounter: Payer: Self-pay | Admitting: Nurse Practitioner

## 2021-06-09 ENCOUNTER — Ambulatory Visit (INDEPENDENT_AMBULATORY_CARE_PROVIDER_SITE_OTHER): Payer: 59 | Admitting: Nurse Practitioner

## 2021-06-09 DIAGNOSIS — E8881 Metabolic syndrome: Secondary | ICD-10-CM

## 2021-06-09 DIAGNOSIS — E782 Mixed hyperlipidemia: Secondary | ICD-10-CM

## 2021-06-09 DIAGNOSIS — E88819 Insulin resistance, unspecified: Secondary | ICD-10-CM

## 2021-06-09 DIAGNOSIS — Z6841 Body Mass Index (BMI) 40.0 and over, adult: Secondary | ICD-10-CM

## 2021-06-09 DIAGNOSIS — E66813 Obesity, class 3: Secondary | ICD-10-CM

## 2021-06-09 DIAGNOSIS — I119 Hypertensive heart disease without heart failure: Secondary | ICD-10-CM

## 2021-06-09 MED ORDER — OZEMPIC (1 MG/DOSE) 4 MG/3ML ~~LOC~~ SOPN: 1.0000 mg | PEN_INJECTOR | SUBCUTANEOUS | 0 refills | Status: DC

## 2021-06-09 MED ORDER — SEMAGLUTIDE-WEIGHT MANAGEMENT 0.5 MG/0.5ML ~~LOC~~ SOAJ: 0.5000 mg | SUBCUTANEOUS | 0 refills | Status: DC

## 2021-06-09 MED ORDER — SEMAGLUTIDE (2 MG/DOSE) 8 MG/3ML ~~LOC~~ SOPN: 2.0000 mg | PEN_INJECTOR | SUBCUTANEOUS | 1 refills | Status: DC

## 2021-06-09 MED ORDER — SEMAGLUTIDE (2 MG/DOSE) 8 MG/3ML ~~LOC~~ SOPN: 2.0000 mg | PEN_INJECTOR | SUBCUTANEOUS | 2 refills | Status: DC

## 2021-06-09 MED ORDER — SEMAGLUTIDE-WEIGHT MANAGEMENT 0.25 MG/0.5ML ~~LOC~~ SOAJ: 0.2500 mg | SUBCUTANEOUS | 0 refills | Status: DC

## 2021-06-09 MED ORDER — SEMAGLUTIDE-WEIGHT MANAGEMENT 1.7 MG/0.75ML ~~LOC~~ SOAJ: 1.7000 mg | SUBCUTANEOUS | 0 refills | Status: DC

## 2021-06-09 MED ORDER — SEMAGLUTIDE-WEIGHT MANAGEMENT 2.4 MG/0.75ML ~~LOC~~ SOAJ: 2.4000 mg | SUBCUTANEOUS | 1 refills | Status: DC

## 2021-06-09 MED ORDER — OZEMPIC (0.25 OR 0.5 MG/DOSE) 2 MG/1.5ML ~~LOC~~ SOPN: 0.5000 mg | PEN_INJECTOR | SUBCUTANEOUS | 0 refills | Status: DC

## 2021-06-09 MED ORDER — SEMAGLUTIDE-WEIGHT MANAGEMENT 1 MG/0.5ML ~~LOC~~ SOAJ: 1.0000 mg | SUBCUTANEOUS | 0 refills | Status: DC

## 2021-06-09 NOTE — Progress Notes (Signed)
Subjective:  Patient ID: Rebecca Rojas, adult    DOB: 1955-09-14  Age: 65 y.o. MRN: [id number]  Chief Complaint  Patient presents with   Weight Loss    HPI  Rebecca Rojas is in to discuss weight management. Current BMI 50.66, weight 277 pounds. she has chronic health problems related to obesity including bilateral knee pain, hyperlipidemia, hypertensive heart disease, and fatigue. She tells me she wants to lose weight to improve overall health. She tells me that she will need a knee replacement in the future but currently does not qualify due to elevated BMI. She has tried multiple weight loss methods in the past but has been unsuccessful. She is not a candidate for Phentermine due to a-fib. She is limited with exercise due to chronic orthopedic conditions. She has been doing chair yoga and recumbent bike recently. She attempts to eat a heart healthy diet and drink 48-54 ounces of water daily.   Current Outpatient Medications on File Prior to Visit  Medication Sig Dispense Refill   acetaminophen (TYLENOL) 500 MG tablet Take 1,000 mg by mouth every 6 (six) hours as needed for moderate pain.     albuterol (PROVENTIL HFA;VENTOLIN HFA) 108 (90 BASE) MCG/ACT inhaler Inhale 1-2 puffs into the lungs every 6 (six) hours as needed for wheezing or shortness of breath.      apixaban (ELIQUIS) 5 MG TABS tablet Take 1 tablet (5 mg total) by mouth 2 (two) times daily. 180 tablet 3   azelastine (OPTIVAR) 0.05 % ophthalmic solution Place 1 drop into both eyes 2 (two) times daily as needed for allergies (allergies).     cyclobenzaprine (FLEXERIL) 10 MG tablet Take 1 tablet (10 mg total) by mouth 3 (three) times daily as needed for muscle spasms. 30 tablet 1   cyclobenzaprine (FLEXERIL) 10 MG tablet Take 1 tablet (10 mg total) by mouth 3 (three) times daily as needed for muscle spasms. 30 tablet 0   diltiazem (TIAZAC) 180 MG 24 hr capsule Take 1 capsule (180 mg total) by mouth daily. 90 capsule 3   EPINEPHrine 0.3  mg/0.3 mL IJ SOAJ injection Inject 0.3 mg into the muscle as needed for anaphylaxis.     fluticasone furoate-vilanterol (BREO ELLIPTA) 100-25 MCG/INH AEPB Inhale 1 puff into the lungs daily.     furosemide (LASIX) 20 MG tablet Take 40 mg by mouth daily as needed for edema (fluid retention.).     ipratropium (ATROVENT) 0.02 % nebulizer solution Take 3 mLs by nebulization every 6 (six) hours as needed for wheezing or shortness of breath.     levocetirizine (XYZAL) 5 MG tablet Take 5 mg by mouth every evening.     Magnesium 500 MG CAPS Take 500 mg by mouth daily.     metoprolol succinate (TOPROL XL) 25 MG 24 hr tablet Take 1 tablet (25 mg total) by mouth daily. 90 tablet 3   mometasone (NASONEX) 50 MCG/ACT nasal spray Place 2 sprays into the nose daily. 17 g 12   montelukast (SINGULAIR) 10 MG tablet Take 10 mg by mouth at bedtime.     predniSONE (DELTASONE) 20 MG tablet Take 1 tablet (20 mg total) by mouth daily with breakfast. 1 po tid for 3 days then 1 po bid for 3 days then 1 po qd for 3 days 18 tablet 0   PRESCRIPTION MEDICATION Inject 1 kit into the skin every 7 (seven) days. Allergy shot unknown to pt     triamcinolone cream (KENALOG) 0.1 % Apply 1 application  topically 2 (two) times daily. 30 g 0   Vitamin D, Ergocalciferol, (DRISDOL) 1.25 MG (50000 UNIT) CAPS capsule Take 1 capsule (50,000 Units total) by mouth every 7 (seven) days. 12 capsule 3   No current facility-administered medications on file prior to visit.   Past Medical History:  Diagnosis Date   Acute laryngopharyngitis 01/13/2020   Acute non-recurrent frontal sinusitis 09/22/2017   Allergic rhinitis 02/15/2021   Allergic rhinitis due to animal (cat) (dog) hair and dander 02/15/2021   Allergic rhinitis due to pollen 02/15/2021   Allergy    dust, environmental, feathers, has epipen, prn   Allergy-induced asthma    Anemia    Annual physical exam 02/15/2021   Arthritis    osteoarthritis   Arthritis of right acromioclavicular  joint 06/15/2019   Asthma 12/28/2015   Asthma with exacerbation 04/27/2020   Atrial fibrillation ([place]) 05/20/2020   Atrial fibrillation with RVR ([place]) 05/20/2020   Back pain    Benign essential hypertension 12/28/2015   Bilateral lower extremity edema 08/04/2018   Chronic allergic conjunctivitis 02/15/2021   Encounter for screening colonoscopy 01/08/2017   Encounter for screening mammogram for breast cancer 02/15/2021   Glenoid labral tear, right, initial encounter 06/15/2019   Headache    Healthcare maintenance 01/08/2017   High risk medication use 12/28/2015   History of seasonal allergies    Hordeolum internum of right lower eyelid 02/14/2017   Hypertensive heart disease 06/02/2020   Insulin resistance 10/01/2019   Joint pain    Lower extremity edema    Lower respiratory infection (e.g., bronchitis, pneumonia, pneumonitis, pulmonitis) 05/10/2009   Qualifier: Diagnosis of  By: Gwenette Greet MD, Armando Reichert    Migraine headache 12/28/2015   Mild persistent asthma, uncomplicated 04/02/4817   Mixed hyperlipidemia 12/28/2015   Morbid obesity ([place])    Myalgia 12/28/2015   Need for shingles vaccine 02/15/2021   Osteoarthritis    Other chest pain 02/15/2021   Other specified menopausal and perimenopausal disorders 02/15/2021   Pain of right eye 02/14/2017   Pain of right foot 08/30/2016   Persistent cough for 3 weeks or longer 10/11/2017   Pharyngitis 05/23/2018   Rotator cuff tear, right 06/15/2019   Shortness of breath    maybe with exertion   Skin lesion of foot 08/30/2016   Sleep apnea    uses cpap   Sleep apnea with use of continuous positive airway pressure (CPAP) 02/15/2021   Sore throat 05/11/2018   Unspecified open wound, left lower leg, initial encounter 08/04/2018   Vertigo    Vitamin D deficiency    Past Surgical History:  Procedure Laterality Date   ABDOMINAL HYSTERECTOMY     COLONOSCOPY  09/2010   normal   FRACTURE SURGERY     left 5th finger fx with bone graft repair    HEEL SPUR EXCISION     JOINT REPLACEMENT     KNEE ARTHROSCOPY Left    Left Knee lateral release     LIPOMA EXCISION     SHOULDER ARTHROSCOPY WITH ROTATOR CUFF REPAIR AND SUBACROMIAL DECOMPRESSION Right 06/15/2019   Procedure: SHOULDER ARTHROSCOPY WITH ROTATOR CUFF REPAIR AND SUBACROMIAL DECOMPRESSION;  Surgeon: Dorna Leitz, MD;  Location: [facility];  Service: Orthopedics;  Laterality: Right;   SVT ABLATION N/A 12/06/2020   Procedure: SVT ABLATION;  Surgeon: Constance Haw, MD;  Location: [facility];  Service: Cardiovascular;  Laterality: N/A;   TONSILLECTOMY     TOTAL KNEE ARTHROPLASTY  08/03/2011   Procedure: TOTAL KNEE ARTHROPLASTY;  Surgeon: Alta Corning;  Location: [facility];  Service: Orthopedics;  Laterality: Right;  COMPUTER ASSISTED TOTAL KNEE REPLACEMENTwith revision tibial component   TUBAL LIGATION      Family History  Problem Relation Age of Onset   Hypertension Mother    Heart disease Mother    Heart attack Mother    Hyperlipidemia Mother    Stroke Mother    Obesity Mother    Cancer Father    Liver disease Father    Obesity Father    Anesthesia problems Neg Hx    Hypotension Neg Hx    Malignant hyperthermia Neg Hx    Pseudochol deficiency Neg Hx    Colon cancer Neg Hx    Colon polyps Neg Hx    Esophageal cancer Neg Hx    Rectal cancer Neg Hx    Stomach cancer Neg Hx    Social History   Socioeconomic History   Marital status: Married    Spouse name: Linden Mikes   Number of children: 2   Years of education: Not on file   Highest education level: Not on file  Occupational History   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 kits  Tobacco Use   Smoking status: Never   Smokeless tobacco: Never  Vaping Use   Vaping Use: Never used  Substance and Sexual Activity   Alcohol use: Not Currently   Drug use: Never   Sexual activity: Yes  Other Topics Concern   Not on file  Social History Narrative   Not on file   Social Determinants of Health   Financial  Resource Strain: Not on file  Food Insecurity: Not on file  Transportation Needs: Not on file  Physical Activity: Not on file  Stress: Not on file  Social Connections: Not on file    Review of Systems  Constitutional:  Negative for chills, diaphoresis, fatigue and fever.  HENT:  Negative for congestion, ear pain and sore throat.   Respiratory:  Negative for cough and shortness of breath.   Cardiovascular:  Negative for chest pain and leg swelling.  Gastrointestinal:  Negative for abdominal pain, constipation, diarrhea, nausea and vomiting.  Genitourinary:  Negative for dysuria and urgency.  Musculoskeletal:  Positive for arthralgias (chronic bilateral knees), back pain (Rt lower back pain), gait problem and joint swelling (bilateral knees). Negative for myalgias.  Neurological:  Negative for dizziness and headaches.  Psychiatric/Behavioral:  Negative for dysphoric mood.     Objective:  Pulse 79   Temp (!) 96.3 F (35.7 C)   Ht 5' 2"  (1.575 m)   Wt 277 lb (125.6 kg)   SpO2 99%   BMI 50.66 kg/m  BP 138/84   Pulse 79   Temp (!) 96.3 F (35.7 C)   Ht 5' 2"  (1.575 m)   Wt 277 lb (125.6 kg)   SpO2 99%   BMI 50.66 kg/m    BP/Weight 06/09/2021 25/42/7062 10/24/6281  Systolic BP - 151 761  Diastolic BP - 90 82  Wt. (Lbs) 277 278 274.8  BMI 50.66 52.53 51.92    Physical Exam Vitals reviewed.  Constitutional:      Appearance: She is obese.  Musculoskeletal:        General: Tenderness (lower back) present.  Skin:    General: Skin is warm and dry.     Capillary Refill: Capillary refill takes less than 2 seconds.  Neurological:     General: No focal deficit present.     Mental Status: She is alert and  oriented to person, place, and time.  Psychiatric:        Mood and Affect: Mood normal.        Behavior: Behavior normal.   Lab Results  Component Value Date   WBC 7.9 02/13/2021   HGB 13.3 02/13/2021   HCT 41.0 02/13/2021   PLT 316 02/13/2021   GLUCOSE 105 (H)  02/13/2021   CHOL 180 02/13/2021   TRIG 165 (H) 02/13/2021   HDL 41 02/13/2021   LDLCALC 110 (H) 02/13/2021   ALT 17 02/13/2021   AST 14 02/13/2021   NA 143 02/13/2021   K 4.4 02/13/2021   CL 105 02/13/2021   CREATININE 0.71 02/13/2021   BUN 13 02/13/2021   CO2 24 02/13/2021   TSH 4.450 02/13/2021   INR 1.53 (H) 08/06/2011   HGBA1C 5.5 02/13/2021      Assessment & Plan:   1. Class 3 severe obesity with serious comorbidity and body mass index (BMI) of 45.0 to 49.9 in adult, unspecified obesity type ([place]) - Semaglutide,0.25 or 0.5MG/DOS, (OZEMPIC, 0.25 OR 0.5 MG/DOSE,) 2 MG/1.5ML SOPN; Inject 0.5 mg into the skin once a week.  Dispense: 1.5 mL; Refill: 0 -Semaglutide 0.5 mg injection weekly for 4 weeks -Semaglutide 1 mg injection weekly for 4 weeks  2. Mixed hyperlipidemia - Semaglutide,0.25 or 0.5MG/DOS, (OZEMPIC, 0.25 OR 0.5 MG/DOSE,) 2 MG/1.5ML SOPN; Inject 0.5 mg into the skin once a week.  Dispense: 1.5 mL; Refill: 0 -Semaglutide 0.5 mg injection weekly for 4 weeks -Semaglutide 1 mg injection weekly for 4 weeks  3. Insulin resistance - Semaglutide,0.25 or 0.5MG/DOS, (OZEMPIC, 0.25 OR 0.5 MG/DOSE,) 2 MG/1.5ML SOPN; Inject 0.5 mg into the skin once a week.  Dispense: 1.5 mL; Refill: 0 -Semaglutide 0.5 mg injection weekly for 4 weeks  4. Hypertensive heart disease without heart failure - Semaglutide,0.25 or 0.5MG/DOS, (OZEMPIC, 0.25 OR 0.5 MG/DOSE,) 2 MG/1.5ML SOPN; Inject 0.5 mg into the skin once a week.  Dispense: 1.5 mL; Refill: 0  -Semaglutide 0.5 mg injection weekly for 4 weeks -Semaglutide 1 mg injection weekly for 4 weeks   Begin Semaglutide injection weekly 0.25 mg for 4-weeks, then increase to 0.5 mg injection weekly for 4-weeks, Then increase to 1 mg injection for 4-weeks Heart healthy low-carb diet Increase physical activity Follow-up in 49-month, fasting  Follow-up: 35-month I, ShRip HarbourNP, have reviewed all documentation for this visit. The  documentation on 06/09/21 for the exam, diagnosis, procedures, and orders are all accurate and complete.   An After Visit Summary was printed and given to the patient.   Signed, ShRip HarbourNP [phone]

## 2021-06-09 NOTE — Patient Instructions (Addendum)
Begin Semaglutide injection weekly 0.25 mg for 4-weeks, then increase to 0.5 mg injection weekly for 4-weeks, Then increase to 1 mg injection for 4-weeks Heart healthy low-carb diet Increase physical activity Follow-up in 10-month, fasting    Semaglutide Injection (Weight Management) What is this medication? SEMAGLUTIDE (SEM a GLOO tide) promotes weight loss. It may also be used to maintain weight loss. It works by decreasing appetite. Changes to diet and exercise are often combined with this medication. This medicine may be used for other purposes; ask your health care provider or pharmacist if you have questions. COMMON BRAND NAME(S): [id number] should I tell my care team before I take this medication? They need to know if you have any of these conditions: Endocrine tumors (MEN 2) or if someone in your family had these tumors Eye disease, vision problems Gallbladder disease History of depression or mental health disease History of pancreatitis Kidney disease Stomach or intestine problems Suicidal thoughts, plans, or attempt; a previous suicide attempt by you or a family member Thyroid cancer or if someone in your family had thyroid cancer An unusual or allergic reaction to semaglutide, other medications, foods, dyes, or preservatives Pregnant or trying to get pregnant Breast-feeding How should I use this medication? This medication is injected under the skin. You will be taught how to prepare and give it. Take it as directed on the prescription label. It is given once every week (every 7 days). Keep taking it unless your care team tells you to stop. It is important that you put your used needles and pens in a special sharps container. Do not put them in a trash can. If you do not have a sharps container, call your pharmacist or care team to get one. A special MedGuide will be given to you by the pharmacist with each prescription and refill. Be sure to read this information carefully  each time. This medication comes with INSTRUCTIONS FOR USE. Ask your pharmacist for directions on how to use this medication. Read the information carefully. Talk to your pharmacist or care team if you have questions. Talk to your care team about the use of this medication in children. Special care may be needed. Overdosage: If you think you have taken too much of this medicine contact a poison control center or emergency room at once. NOTE: This medicine is only for you. Do not share this medicine with others. What if I miss a dose? If you miss a dose and the next scheduled dose is more than 2 days away, take the missed dose as soon as possible. If you miss a dose and the next scheduled dose is less than 2 days away, do not take the missed dose. Take the next dose at your regular time. Do not take double or extra doses. If you miss your dose for 2 weeks or more, take the next dose at your regular time or call your care team to talk about how to restart this medication. What may interact with this medication? Insulin and other medications for diabetes This list may not describe all possible interactions. Give your health care provider a list of all the medicines, herbs, non-prescription drugs, or dietary supplements you use. Also tell them if you smoke, drink alcohol, or use illegal drugs. Some items may interact with your medicine. What should I watch for while using this medication? Visit your care team for regular checks on your progress. It may be some time before you see the benefit from this medication.  Drink plenty of fluids while taking this medication. Check with your care team if you have severe diarrhea, nausea, and vomiting, or if you sweat a lot. The loss of too much body fluid may make it dangerous for you to take this medication. This medication may affect blood sugar levels. Ask your care team if changes in diet or medications are needed if you have diabetes. If you or your family  notice any changes in your behavior, such as new or worsening depression, thoughts of harming yourself, anxiety, other unusual or disturbing thoughts, or memory loss, call your care team right away. Women should inform their care team if they wish to become pregnant or think they might be pregnant. Losing weight while pregnant is not advised and may cause harm to the unborn child. Talk to your care team for more information. What side effects may I notice from receiving this medication? Side effects that you should report to your care team as soon as possible: Allergic reactions-skin rash, itching, hives, swelling of the face, lips, tongue, or throat Change in vision Dehydration-increased thirst, dry mouth, feeling faint or lightheaded, headache, dark yellow or brown urine Gallbladder problems-severe stomach pain, nausea, vomiting, fever Heart palpitations-rapid, pounding, or irregular heartbeat Kidney injury-decrease in the amount of urine, swelling of the ankles, hands, or feet Pancreatitis-severe stomach pain that spreads to your back or gets worse after eating or when touched, fever, nausea, vomiting Thoughts of suicide or self-harm, worsening mood, feelings of depression Thyroid cancer-new mass or lump in the neck, pain or trouble swallowing, trouble breathing, hoarseness Side effects that usually do not require medical attention (report to your care team if they continue or are bothersome): Diarrhea Loss of appetite Nausea Stomach pain Vomiting This list may not describe all possible side effects. Call your doctor for medical advice about side effects. You may report side effects to FDA at 1-800-FDA-1088. Where should I keep my medication? Keep out of the reach of children and pets. Refrigeration (preferred): Store in the refrigerator. Do not freeze. Keep this medication in the original container until you are ready to take it. Get rid of any unused medication after the expiration  date. Room temperature: If needed, prior to cap removal, the pen can be stored at room temperature for up to 28 days. Protect from light. If it is stored at room temperature, get rid of any unused medication after 28 days or after it expires, whichever is first. It is important to get rid of the medication as soon as you no longer need it or it is expired. You can do this in two ways: Take the medication to a medication take-back program. Check with your pharmacy or law enforcement to find a location. If you cannot return the medication, follow the directions in the [place]. NOTE: This sheet is a summary. It may not cover all possible information. If you have questions about this medicine, talk to your doctor, pharmacist, or health care provider.  2022 Elsevier/Gold Standard (2020-11-11 08:20:32) Exercises to do While Sitting Exercises that you do while sitting (chair exercises) can give you many of the same benefits as full exercise. Benefits include strengthening your heart, burning calories, and keeping muscles and joints healthy. Exercise can also improve your mood and help with depression and anxiety. You may benefit from chair exercises if you are unable to do standing exercises due to: Diabetic foot pain. Obesity. Illness. Arthritis. Recovery from surgery or injury. Breathing problems. Balance problems. Another type of disability. Before  starting chair exercises, check with your health care provider or a physical therapist to find out how much exercise you can tolerate and which exercises are safe for you. If your health care provider approves: Start out slowly and build up over time. Aim to work up to about 10-20 minutes for each exercise session. Make exercise part of your daily routine. Drink water when you exercise. Do not wait until you are thirsty. Drink every 10-15 minutes. Stop exercising right away if you have pain, nausea, shortness of breath, or dizziness. If you are  exercising in a wheelchair, make sure to lock the wheels. Ask your health care provider whether you can do tai chi or yoga. Many positions in these mind-body exercises can be modified to do while seated. Warm-up Before starting other exercises: Sit up as straight as you can. Have your knees bent at 90 degrees, which is the shape of the capital letter "L." Keep your feet flat on the floor. Sit at the front edge of your chair, if you can. Pull in (tighten) the muscles in your abdomen and stretch your spine and neck as straight as you can. Hold this position for a few minutes. Breathe in and out evenly. Try to concentrate on your breathing, and relax your mind. Stretching Exercise A: Arm stretch Hold your arms out straight in front of your body. Bend your hands at the wrist with your fingers pointing up, as if signaling someone to stop. Notice the slight tension in your forearms as you hold the position. Keeping your arms out and your hands bent, rotate your hands outward as far as you can and hold this stretch. Aim to have your thumbs pointing up and your pinkie fingers pointing down. Slowly repeat arm stretches for one minute as tolerated. Exercise B: Leg stretch If you can move your legs, try to "draw" letters on the floor with the toes of your foot. Write your name with one foot. Write your name with the toes of your other foot. Slowly repeat the movements for one minute as tolerated. Exercise C: Reach for the sky Reach your hands as far over your head as you can to stretch your spine. Move your hands and arms as if you are climbing a rope. Slowly repeat the movements for one minute as tolerated. Range of motion exercises Exercise A: Shoulder roll Let your arms hang loosely at your sides. Lift just your shoulders up toward your ears, then let them relax back down. When your shoulders feel loose, rotate your shoulders in backward and forward circles. Do shoulder rolls slowly for one minute  as tolerated. Exercise B: March in place As if you are marching, pump your arms and lift your legs up and down. Lift your knees as high as you can. If you are unable to lift your knees, just pump your arms and move your ankles and feet up and down. March in place for one minute as tolerated. Exercise C: Seated jumping jacks Let your arms hang down straight. Keeping your arms straight, lift them up over your head. Aim to point your fingers to the ceiling. While you lift your arms, straighten your legs and slide your heels along the floor to your sides, as wide as you can. As you bring your arms back down to your sides, slide your legs back together. If you are unable to use your legs, just move your arms. Slowly repeat seated jumping jacks for one minute as tolerated. Strengthening exercises Exercise A: Shoulder  squeeze Hold your arms straight out from your body to your sides, with your elbows bent and your fists pointed at the ceiling. Keeping your arms in the bent position, move them forward so your elbows and forearms meet in front of your face. Open your arms back out as wide as you can with your elbows still bent, until you feel your shoulder blades squeezing together. Hold for 5 seconds. Slowly repeat the movements forward and backward for one minute as tolerated. Contact a health care provider if: You have to stop exercising due to any of the following: Pain. Nausea. Shortness of breath. Dizziness. Fatigue. You have significant pain or soreness after exercising. Get help right away if: You have chest pain. You have difficulty breathing. These symptoms may represent a serious problem that is an emergency. Do not wait to see if the symptoms will go away. Get medical help right away. Call your local emergency services (911 in the [place] Do not drive yourself to the hospital. Summary Exercises that you do while sitting (chair exercises) can strengthen your heart, burn calories, and  keep muscles and joints healthy. You may benefit from chair exercises if you are unable to do standing exercises due to diabetic foot pain, obesity, recovery from surgery or injury, or other conditions. Before starting chair exercises, check with your health care provider or a physical therapist to find out how much exercise you can tolerate and which exercises are safe for you. This information is not intended to replace advice given to you by your health care provider. Make sure you discuss any questions you have with your health care provider. Document Revised: 10/02/2020 Document Reviewed: 10/02/2020 Elsevier Patient Education  2022 [employer].

## 2021-06-25 ENCOUNTER — Other Ambulatory Visit: Payer: Self-pay | Admitting: Cardiology

## 2021-06-28 ENCOUNTER — Ambulatory Visit: Payer: 59 | Admitting: Orthopedic Surgery

## 2021-07-17 ENCOUNTER — Ambulatory Visit (INDEPENDENT_AMBULATORY_CARE_PROVIDER_SITE_OTHER): Payer: Medicare Other | Admitting: Nurse Practitioner

## 2021-07-17 ENCOUNTER — Encounter: Payer: Self-pay | Admitting: Nurse Practitioner

## 2021-07-17 ENCOUNTER — Other Ambulatory Visit: Payer: Self-pay

## 2021-07-17 VITALS — BP 134/86 | HR 94 | Temp 96.5°F | Ht 61.0 in | Wt 264.0 lb

## 2021-07-17 DIAGNOSIS — M5441 Lumbago with sciatica, right side: Secondary | ICD-10-CM | POA: Diagnosis not present

## 2021-07-17 NOTE — Progress Notes (Signed)
 Subjective:  Patient ID: Rebecca Rojas, adult    DOB: 12/25/1955  Age: 65 y.o. MRN: [id number]  Chief Complaint  Patient presents with   Back Pain    HPI  Rebecca Rojas is a 65-year-old Caucasian female that presents for follow-up of right lower back pain that radiates down posterior right leg.   Back Pain  She reports recurrent back pain. There was not an injury that may have caused the pain. The most recent episode started  5 weeks ago and is staying constant. The pain is located in the right lumbar area with radiation. It is described as aching, burning, and tingling, is 5/10 in intensity, occurring constantly. Symptoms are worse in the: all day  Aggravating factors: standing and walking Relieving factors: none.  She has tried application of heat, application of ice, acetaminophen, oral steroids, and prescription pain relievers with little relief.  Was seen at [facility] two days ago, treatment included Toradol, Cyclobenzaprine, Prednisone and Meloxicam. B/P at that time was 169/99.  Associated symptoms: No abdominal pain No bowel incontinence  No chest pain No dysuria   No fever No headaches  No joint pains No pelvic pain  yes weakness in leg  Yes tingling in lower extremities  No urinary incontinence No weight loss    Current Outpatient Medications on File Prior to Visit  Medication Sig Dispense Refill   acetaminophen (TYLENOL) 500 MG tablet Take 1,000 mg by mouth every 6 (six) hours as needed for moderate pain.     albuterol (PROVENTIL HFA;VENTOLIN HFA) 108 (90 BASE) MCG/ACT inhaler Inhale 1-2 puffs into the lungs every 6 (six) hours as needed for wheezing or shortness of breath.      azelastine (OPTIVAR) 0.05 % ophthalmic solution Place 1 drop into both eyes 2 (two) times daily as needed for allergies (allergies).     cyclobenzaprine (FLEXERIL) 10 MG tablet Take 1 tablet (10 mg total) by mouth 3 (three) times daily as needed for muscle spasms. 30 tablet 1   cyclobenzaprine (FLEXERIL) 5 MG  tablet Take 5 mg by mouth 3 (three) times daily as needed.     EPINEPHrine 0.3 mg/0.3 mL IJ SOAJ injection Inject 0.3 mg into the muscle as needed for anaphylaxis.     fluticasone furoate-vilanterol (BREO ELLIPTA) 100-25 MCG/INH AEPB Inhale 1 puff into the lungs daily.     furosemide (LASIX) 20 MG tablet Take 40 mg by mouth daily as needed for edema (fluid retention.).     ipratropium (ATROVENT) 0.02 % nebulizer solution Take 3 mLs by nebulization every 6 (six) hours as needed for wheezing or shortness of breath.     levocetirizine (XYZAL) 5 MG tablet Take 5 mg by mouth every evening.     Magnesium 500 MG CAPS Take 500 mg by mouth daily.     meloxicam (MOBIC) 7.5 MG tablet Take 7.5 mg by mouth 2 (two) times daily.     metoprolol succinate (TOPROL XL) 25 MG 24 hr tablet Take 1 tablet (25 mg total) by mouth daily. 90 tablet 3   mometasone (NASONEX) 50 MCG/ACT nasal spray Place 2 sprays into the nose daily. 17 g 12   montelukast (SINGULAIR) 10 MG tablet Take 10 mg by mouth at bedtime.     predniSONE (DELTASONE) 20 MG tablet Take 1 tablet (20 mg total) by mouth daily with breakfast. 1 po tid for 3 days then 1 po bid for 3 days then 1 po qd for 3 days 18 tablet 0   PRESCRIPTION MEDICATION   Inject 1 kit into the skin every 7 (seven) days. Allergy shot unknown to pt     Semaglutide, 1 MG/DOSE, (OZEMPIC, 1 MG/DOSE,) 4 MG/3ML SOPN Inject 1 mg into the skin once a week. Inject 1 mg into skin once a week for four weeks 3 mL 0   Semaglutide, 2 MG/DOSE, 8 MG/3ML SOPN Inject 2 mg as directed once a week. 3 mL 1   Semaglutide,0.25 or 0.5MG/DOS, (OZEMPIC, 0.25 OR 0.5 MG/DOSE,) 2 MG/1.5ML SOPN Inject 0.5 mg into the skin once a week. 1.5 mL 0   TIADYLT ER 180 MG 24 hr capsule TAKE 1 CAPSULE BY MOUTH EVERY DAY 90 capsule 3   triamcinolone cream (KENALOG) 0.1 % Apply 1 application topically 2 (two) times daily. 30 g 0   Vitamin D, Ergocalciferol, (DRISDOL) 1.25 MG (50000 UNIT) CAPS capsule Take 1 capsule (50,000 Units  total) by mouth every 7 (seven) days. 12 capsule 3   No current facility-administered medications on file prior to visit.   Past Medical History:  Diagnosis Date   Acute laryngopharyngitis 01/13/2020   Acute non-recurrent frontal sinusitis 09/22/2017   Allergic rhinitis 02/15/2021   Allergic rhinitis due to animal (cat) (dog) hair and dander 02/15/2021   Allergic rhinitis due to pollen 02/15/2021   Allergy    dust, environmental, feathers, has epipen, prn   Allergy-induced asthma    Anemia    Annual physical exam 02/15/2021   Arthritis    osteoarthritis   Arthritis of right acromioclavicular joint 06/15/2019   Asthma 12/28/2015   Asthma with exacerbation 04/27/2020   Atrial fibrillation ([place]) 05/20/2020   Atrial fibrillation with RVR ([place]) 05/20/2020   Back pain    Benign essential hypertension 12/28/2015   Bilateral lower extremity edema 08/04/2018   Chronic allergic conjunctivitis 02/15/2021   Encounter for screening colonoscopy 01/08/2017   Encounter for screening mammogram for breast cancer 02/15/2021   Glenoid labral tear, right, initial encounter 06/15/2019   Headache    Healthcare maintenance 01/08/2017   High risk medication use 12/28/2015   History of seasonal allergies    Hordeolum internum of right lower eyelid 02/14/2017   Hypertensive heart disease 06/02/2020   Insulin resistance 10/01/2019   Joint pain    Lower extremity edema    Lower respiratory infection (e.g., bronchitis, pneumonia, pneumonitis, pulmonitis) 05/10/2009   Qualifier: Diagnosis of  By: Gwenette Greet MD, Armando Reichert    Migraine headache 12/28/2015   Mild persistent asthma, uncomplicated 1/85/6314   Mixed hyperlipidemia 12/28/2015   Morbid obesity ([place])    Myalgia 12/28/2015   Need for shingles vaccine 02/15/2021   Osteoarthritis    Other chest pain 02/15/2021   Other specified menopausal and perimenopausal disorders 02/15/2021   Pain of right eye 02/14/2017   Pain of right foot 08/30/2016   Persistent  cough for 3 weeks or longer 10/11/2017   Pharyngitis 05/23/2018   Rotator cuff tear, right 06/15/2019   Shortness of breath    maybe with exertion   Skin lesion of foot 08/30/2016   Sleep apnea    uses cpap   Sleep apnea with use of continuous positive airway pressure (CPAP) 02/15/2021   Sore throat 05/11/2018   Unspecified open wound, left lower leg, initial encounter 08/04/2018   Vertigo    Vitamin D deficiency    Past Surgical History:  Procedure Laterality Date   ABDOMINAL HYSTERECTOMY     COLONOSCOPY  09/2010   normal   FRACTURE SURGERY     left 5th finger fx  with bone graft repair   HEEL SPUR EXCISION     JOINT REPLACEMENT     KNEE ARTHROSCOPY Left    Left Knee lateral release     LIPOMA EXCISION     SHOULDER ARTHROSCOPY WITH ROTATOR CUFF REPAIR AND SUBACROMIAL DECOMPRESSION Right 06/15/2019   Procedure: SHOULDER ARTHROSCOPY WITH ROTATOR CUFF REPAIR AND SUBACROMIAL DECOMPRESSION;  Surgeon: Dorna Leitz, MD;  Location: [facility];  Service: Orthopedics;  Laterality: Right;   SVT ABLATION N/A 12/06/2020   Procedure: SVT ABLATION;  Surgeon: Constance Haw, MD;  Location: [facility];  Service: Cardiovascular;  Laterality: N/A;   TONSILLECTOMY     TOTAL KNEE ARTHROPLASTY  08/03/2011   Procedure: TOTAL KNEE ARTHROPLASTY;  Surgeon: Alta Corning;  Location: [facility];  Service: Orthopedics;  Laterality: Right;  COMPUTER ASSISTED TOTAL KNEE REPLACEMENTwith revision tibial component   TUBAL LIGATION      Family History  Problem Relation Age of Onset   Hypertension Mother    Heart disease Mother    Heart attack Mother    Hyperlipidemia Mother    Stroke Mother    Obesity Mother    Cancer Father    Liver disease Father    Obesity Father    Anesthesia problems Neg Hx    Hypotension Neg Hx    Malignant hyperthermia Neg Hx    Pseudochol deficiency Neg Hx    Colon cancer Neg Hx    Colon polyps Neg Hx    Esophageal cancer Neg Hx    Rectal cancer Neg Hx    Stomach cancer  Neg Hx    Social History   Socioeconomic History   Marital status: Married    Spouse name: Mayline Dragon   Number of children: 2   Years of education: Not on file   Highest education level: Not on file  Occupational History   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 [employer]  Tobacco Use   Smoking status: Never   Smokeless tobacco: Never  Vaping Use   Vaping Use: Never used  Substance and Sexual Activity   Alcohol use: Not Currently   Drug use: Never   Sexual activity: Yes  Other Topics Concern   Not on file  Social History Narrative   Not on file   Social Determinants of Health   Financial Resource Strain: Not on file  Food Insecurity: Not on file  Transportation Needs: Not on file  Physical Activity: Not on file  Stress: Not on file  Social Connections: Not on file    Review of Systems  Constitutional:  Negative for chills, diaphoresis, fatigue and fever.  HENT:  Negative for congestion, ear pain and sore throat.   Respiratory:  Negative for cough and shortness of breath.   Cardiovascular:  Negative for chest pain and leg swelling.  Musculoskeletal:  Positive for back pain (Left back pain).    Objective:  Pulse 94   Temp (!) 95 F (35 C)   Ht 5' 1" (1.549 m)   Wt 266 lb (120.7 kg)   SpO2 98%   BMI 50.26 kg/m  BP 134/86   Pulse 94   Temp (!) 96.5 F (35.8 C)   Ht 5' 1" (1.549 m)   Wt 264 lb (119.7 kg)   SpO2 98%   BMI 49.88 kg/m    BP/Weight 07/17/2021 06/09/2021 32/67/1245  Systolic BP - 809 983  Diastolic BP - 84 90  Wt. (Lbs) 266 277 278  BMI 50.26 50.66 52.53  Physical Exam Vitals reviewed.  Constitutional:      Appearance: Normal appearance.  Cardiovascular:     Rate and Rhythm: Normal rate and regular rhythm.     Pulses: Normal pulses.     Heart sounds: Normal heart sounds.  Pulmonary:     Effort: Pulmonary effort is normal.     Breath sounds: Normal breath sounds.  Abdominal:     General: Bowel sounds are normal.     Palpations:  Abdomen is soft.  Musculoskeletal:        General: Tenderness present.     Lumbar back: Tenderness present. Decreased range of motion.  Skin:    General: Skin is warm and dry.     Capillary Refill: Capillary refill takes less than 2 seconds.  Neurological:     General: No focal deficit present.     Mental Status: She is alert and oriented to person, place, and time.  Psychiatric:        Mood and Affect: Mood normal.        Behavior: Behavior normal.   Diabetic Foot Exam - Simple   No data filed      Lab Results  Component Value Date   WBC 7.9 02/13/2021   HGB 13.3 02/13/2021   HCT 41.0 02/13/2021   PLT 316 02/13/2021   GLUCOSE 105 (H) 02/13/2021   CHOL 180 02/13/2021   TRIG 165 (H) 02/13/2021   HDL 41 02/13/2021   LDLCALC 110 (H) 02/13/2021   ALT 17 02/13/2021   AST 14 02/13/2021   NA 143 02/13/2021   K 4.4 02/13/2021   CL 105 02/13/2021   CREATININE 0.71 02/13/2021   BUN 13 02/13/2021   CO2 24 02/13/2021   TSH 4.450 02/13/2021   INR 1.53 (H) 08/06/2011   HGBA1C 5.5 02/13/2021      Assessment & Plan:    1. Acute right-sided low back pain with right-sided sciatica - DG Lumbar Spine Complete; Future - Ambulatory referral to Spine Surgery      Continue medications as prescribed Obtain x-ray at [facility] today We will call you with referral to spine specialist appointment and x-ray results     Follow-up: PRN  An After Visit Summary was printed and given to the patient.  I, Shannon J Heaton, NP, have reviewed all documentation for this visit. The documentation on 07/17/21 for the exam, diagnosis, procedures, and orders are all accurate and complete.    Shannon J Heaton, NP [facility] [phone]  

## 2021-07-17 NOTE — Patient Instructions (Signed)
Continue medications as prescribed Obtain x-ray at [facility] today We will call you with referral to spine specialist appointment and x-ray results    Chronic Back Pain When back pain lasts longer than 3 months, it is called chronic back pain. Pain may get worse at certain times (flare-ups). There are things you can do at home to manage your pain. Follow these instructions at home: Pay attention to any changes in your symptoms. Take these actions to help with your pain: Managing pain and stiffness   If told, put ice on the painful area. Your doctor may tell you to use ice for 24-48 hours after the flare-up starts. To do this: Put ice in a plastic bag. Place a towel between your skin and the bag. Leave the ice on for 20 minutes, 2-3 times a day. If told, put heat on the painful area. Do this as often as told by your doctor. Use the heat source that your doctor recommends, such as a moist heat pack or a heating pad. Place a towel between your skin and the heat source. Leave the heat on for 20-30 minutes. Take off the heat if your skin turns bright red. This is especially important if you are unable to feel pain, heat, or cold. You may have a greater risk of getting burned. Soak in a warm bath. This can help relieve pain. Activity  Avoid bending and other activities that make pain worse. When standing: Keep your upper back and neck straight. Keep your shoulders pulled back. Avoid slouching. When sitting: Keep your back straight. Relax your shoulders. Do not round your shoulders or pull them backward. Do not sit or stand in one place for long periods of time. Take short rest breaks during the day. Lying down or standing is usually better than sitting. Resting can help relieve pain. When sitting or lying down for a long time, do some mild activity or stretching. This will help to prevent stiffness and pain. Get regular exercise. Ask your doctor what activities are safe for  you. Do not lift anything that is heavier than 10 lb (4.5 kg) or the limit that you are told, until your doctor says that it is safe. To prevent injury when you lift things: Bend your knees. Keep the weight close to your body. Avoid twisting. Sleep on a firm mattress. Try lying on your side with your knees slightly bent. If you lie on your back, put a pillow under your knees. Medicines Treatment may include medicines for pain and swelling taken by mouth or put on the skin, prescription pain medicine, or muscle relaxants. Take over-the-counter and prescription medicines only as told by your doctor. Ask your doctor if the medicine prescribed to you: Requires you to avoid driving or using machinery. Can cause trouble pooping (constipation). You may need to take these actions to prevent or treat trouble pooping: Drink enough fluid to keep your pee (urine) pale yellow. Take over-the-counter or prescription medicines. Eat foods that are high in fiber. These include beans, whole grains, and fresh fruits and vegetables. Limit foods that are high in fat and sugars. These include fried or sweet foods. General instructions Do not use any products that contain nicotine or tobacco, such as cigarettes, e-cigarettes, and chewing tobacco. If you need help quitting, ask your doctor. Keep all follow-up visits as told by your doctor. This is important. Contact a doctor if: Your pain does not get better with rest or medicine. Your pain gets worse, or you  have new pain. You have a high fever. You lose weight very quickly. You have trouble doing your normal activities. Get help right away if: One or both of your legs or feet feel weak. One or both of your legs or feet lose feeling (have numbness). You have trouble controlling when you poop (have a bowel movement) or pee (urinate). You have bad back pain and: You feel like you may vomit (nauseous), or you vomit. You have pain in your belly (abdomen). You  have shortness of breath. You faint. Summary When back pain lasts longer than 3 months, it is called chronic back pain. Pain may get worse at certain times (flare-ups). Use ice and heat as told by your doctor. Your doctor may tell you to use ice after flare-ups. This information is not intended to replace advice given to you by your health care provider. Make sure you discuss any questions you have with your health care provider. Document Revised: 09/16/2019 Document Reviewed: 09/16/2019 Elsevier Patient Education  2022 [employer].

## 2021-07-18 ENCOUNTER — Other Ambulatory Visit: Payer: Self-pay

## 2021-07-18 DIAGNOSIS — M5441 Lumbago with sciatica, right side: Secondary | ICD-10-CM

## 2021-07-26 ENCOUNTER — Encounter: Payer: Self-pay | Admitting: Cardiology

## 2021-07-26 ENCOUNTER — Ambulatory Visit (INDEPENDENT_AMBULATORY_CARE_PROVIDER_SITE_OTHER): Payer: Medicare Other | Admitting: Cardiology

## 2021-07-26 ENCOUNTER — Other Ambulatory Visit: Payer: Self-pay

## 2021-07-26 VITALS — BP 140/100 | HR 103 | Ht 61.0 in | Wt 267.0 lb

## 2021-07-26 DIAGNOSIS — I471 Supraventricular tachycardia: Secondary | ICD-10-CM

## 2021-07-26 NOTE — Addendum Note (Signed)
Addended by: Baird Lyons on: 07/26/2021 03:57 PM   Modules accepted: Orders

## 2021-07-26 NOTE — Progress Notes (Signed)
Electrophysiology Office Note   Date:  07/26/2021   ID:  Rebecca Rojas, DOB 1955-09-25, MRN [id number]  PCP:  Rip Harbour, NP  Cardiologist:  Bettina Gavia Primary Electrophysiologist:  Charmine Bockrath Meredith Leeds, MD    Chief Complaint: AF, SVT   History of Present Illness: Rebecca Rojas is a 65 y.o. adult who is being seen today for the evaluation of AF, SVT at the request of Cox, Elnita Maxwell, MD. Presenting today for electrophysiology evaluation.  She has a history significant for paroxysmal atrial fibrillation, obstructive sleep apnea, hypertension, and SVT.  She presented to [facility] with an episode of SVT.  SVT degenerated into atrial fibrillation.  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  Today, denies symptoms of palpitations, chest pain, shortness of breath, orthopnea, PND, lower extremity edema, claudication, dizziness, presyncope, syncope, bleeding, or neurologic sequela. The patient is tolerating medications without difficulties.  Since being seen she has done well.  She has had no further palpitations.  She has been checking her rhythm on her cardia device.  She is overall happy with her control.  She would like to stop her Eliquis.   Past Medical History:  Diagnosis Date   Acute laryngopharyngitis 01/13/2020   Acute non-recurrent frontal sinusitis 09/22/2017   Allergic rhinitis 02/15/2021   Allergic rhinitis due to animal (cat) (dog) hair and dander 02/15/2021   Allergic rhinitis due to pollen 02/15/2021   Allergy    dust, environmental, feathers, has epipen, prn   Allergy-induced asthma    Anemia    Annual physical exam 02/15/2021   Arthritis    osteoarthritis   Arthritis of right acromioclavicular joint 06/15/2019   Asthma 12/28/2015   Asthma with exacerbation 04/27/2020   Atrial fibrillation ([place]) 05/20/2020   Atrial fibrillation with RVR ([place]) 05/20/2020   Back pain    Benign essential hypertension 12/28/2015   Bilateral lower extremity edema  08/04/2018   Chronic allergic conjunctivitis 02/15/2021   Encounter for screening colonoscopy 01/08/2017   Encounter for screening mammogram for breast cancer 02/15/2021   Glenoid labral tear, right, initial encounter 06/15/2019   Headache    Healthcare maintenance 01/08/2017   High risk medication use 12/28/2015   History of seasonal allergies    Hordeolum internum of right lower eyelid 02/14/2017   Hypertensive heart disease 06/02/2020   Insulin resistance 10/01/2019   Joint pain    Lower extremity edema    Lower respiratory infection (e.g., bronchitis, pneumonia, pneumonitis, pulmonitis) 05/10/2009   Qualifier: Diagnosis of  By: Gwenette Greet MD, Armando Reichert    Migraine headache 12/28/2015   Mild persistent asthma, uncomplicated [id number]   Mixed hyperlipidemia 12/28/2015   Morbid obesity ([place])    Myalgia 12/28/2015   Need for shingles vaccine 02/15/2021   Osteoarthritis    Other chest pain 02/15/2021   Other specified menopausal and perimenopausal disorders 02/15/2021   Pain of right eye 02/14/2017   Pain of right foot 08/30/2016   Persistent cough for 3 weeks or longer 10/11/2017   Pharyngitis 05/23/2018   Rotator cuff tear, right 06/15/2019   Shortness of breath    maybe with exertion   Skin lesion of foot 08/30/2016   Sleep apnea    uses cpap   Sleep apnea with use of continuous positive airway pressure (CPAP) 02/15/2021   Sore throat 05/11/2018   Unspecified open wound, left lower leg, initial encounter 08/04/2018   Vertigo    Vitamin D deficiency    Past Surgical History:  Procedure  Laterality Date   ABDOMINAL HYSTERECTOMY     COLONOSCOPY  09/2010   normal   FRACTURE SURGERY     left 5th finger fx with bone graft repair   HEEL SPUR EXCISION     JOINT REPLACEMENT     KNEE ARTHROSCOPY Left    Left Knee lateral release     LIPOMA EXCISION     SHOULDER ARTHROSCOPY WITH ROTATOR CUFF REPAIR AND SUBACROMIAL DECOMPRESSION Right 06/15/2019   Procedure: SHOULDER ARTHROSCOPY WITH  ROTATOR CUFF REPAIR AND SUBACROMIAL DECOMPRESSION;  Surgeon: Dorna Leitz, MD;  Location: [facility];  Service: Orthopedics;  Laterality: Right;   SVT ABLATION N/A 12/06/2020   Procedure: SVT ABLATION;  Surgeon: Constance Haw, MD;  Location: [place] [facility];  Service: Cardiovascular;  Laterality: N/A;   TONSILLECTOMY     TOTAL KNEE ARTHROPLASTY  08/03/2011   Procedure: TOTAL KNEE ARTHROPLASTY;  Surgeon: Alta Corning;  Location: [place];  Service: Orthopedics;  Laterality: Right;  COMPUTER ASSISTED TOTAL KNEE REPLACEMENTwith revision tibial component   TUBAL LIGATION       Current Outpatient Medications  Medication Sig Dispense Refill   acetaminophen (TYLENOL) 500 MG tablet Take 1,000 mg by mouth every 6 (six) hours as needed for moderate pain.     albuterol (PROVENTIL HFA;VENTOLIN HFA) 108 (90 BASE) MCG/ACT inhaler Inhale 1-2 puffs into the lungs every 6 (six) hours as needed for wheezing or shortness of breath.      azelastine (OPTIVAR) 0.05 % ophthalmic solution Place 1 drop into both eyes 2 (two) times daily as needed for allergies (allergies).     cyclobenzaprine (FLEXERIL) 10 MG tablet Take 1 tablet (10 mg total) by mouth 3 (three) times daily as needed for muscle spasms. 30 tablet 1   ELIQUIS 5 MG TABS tablet Take 5 mg by mouth 2 (two) times daily.     EPINEPHrine 0.3 mg/0.3 mL IJ SOAJ injection Inject 0.3 mg into the muscle as needed for anaphylaxis.     fluticasone furoate-vilanterol (BREO ELLIPTA) 100-25 MCG/INH AEPB Inhale 1 puff into the lungs daily.     furosemide (LASIX) 20 MG tablet Take 40 mg by mouth daily as needed for edema (fluid retention.).     ipratropium (ATROVENT) 0.02 % nebulizer solution Take 3 mLs by nebulization every 6 (six) hours as needed for wheezing or shortness of breath.     levocetirizine (XYZAL) 5 MG tablet Take 5 mg by mouth every evening.     Magnesium 500 MG CAPS Take 500 mg by mouth daily.     metoprolol succinate (TOPROL XL) 25 MG 24 hr tablet Take  1 tablet (25 mg total) by mouth daily. 90 tablet 3   montelukast (SINGULAIR) 10 MG tablet Take 10 mg by mouth at bedtime.     Semaglutide,0.25 or 0.5MG /DOS, (OZEMPIC, 0.25 OR 0.5 MG/DOSE,) 2 MG/1.5ML SOPN Inject 0.5 mg into the skin once a week. 1.5 mL 0   TIADYLT ER 180 MG 24 hr capsule TAKE 1 CAPSULE BY MOUTH EVERY DAY 90 capsule 3   Vitamin D, Ergocalciferol, (DRISDOL) 1.25 MG (50000 UNIT) CAPS capsule Take 1 capsule (50,000 Units total) by mouth every 7 (seven) days. 12 capsule 3   Semaglutide, 1 MG/DOSE, (OZEMPIC, 1 MG/DOSE,) 4 MG/3ML SOPN Inject 1 mg into the skin once a week. Inject 1 mg into skin once a week for four weeks (Patient not taking: Reported on 07/26/2021) 3 mL 0   Semaglutide, 2 MG/DOSE, 8 MG/3ML SOPN Inject 2 mg as  directed once a week. (Patient not taking: Reported on 07/26/2021) 3 mL 1   No current facility-administered medications for this visit.    Allergies:   Sulfa antibiotics, Cefdinir, Erythromycin, and Hydrocodone bit-homatrop mbr   Social History:  The patient  reports that she has never smoked. She has never used smokeless tobacco. She reports that she does not currently use alcohol. She reports that she does not use drugs.   Family History:  The patient's family history includes Cancer in her father; Heart attack in her mother; Heart disease in her mother; Hyperlipidemia in her mother; Hypertension in her mother; Liver disease in her father; Obesity in her father and mother; Stroke in her mother.   ROS:  Please see the history of present illness.   Otherwise, review of systems is positive for none.   All other systems are reviewed and negative.   PHYSICAL EXAM: VS:  BP (!) 140/100 (BP Location: Right Arm, Patient Position: Sitting, Cuff Size: Large)   Pulse (!) 103   Ht 5\' 1"  (1.549 m)   Wt 267 lb (121.1 kg)   SpO2 97%   BMI 50.45 kg/m  , BMI Body mass index is 50.45 kg/m. GEN: Well nourished, well developed, in no acute distress  HEENT: normal  Neck: no  JVD, carotid bruits, or masses Cardiac: RRR; no murmurs, rubs, or gallops,no edema  Respiratory:  clear to auscultation bilaterally, normal work of breathing GI: soft, nontender, nondistended, + BS MS: no deformity or atrophy  Skin: warm and dry Neuro:  Strength and sensation are intact Psych: euthymic mood, full affect  EKG:  EKG is ordered today. Personal review of the ekg ordered shows sinus rhythm, rate 103  Recent Labs: 02/13/2021: ALT 17; BUN 13; Creatinine, Ser 0.71; Hemoglobin 13.3; Platelets 316; Potassium 4.4; Sodium 143; TSH 4.450    Lipid Panel     Component Value Date/Time   CHOL 180 02/13/2021 0751   TRIG 165 (H) 02/13/2021 0751   HDL 41 02/13/2021 0751   CHOLHDL 4.4 02/13/2021 0751   LDLCALC 110 (H) 02/13/2021 0751     Wt Readings from Last 3 Encounters:  07/26/21 267 lb (121.1 kg)  07/17/21 264 lb (119.7 kg)  06/09/21 277 lb (125.6 kg)      Other studies Reviewed: Additional studies/ records that were reviewed today include: TTE 05/23/2020 Review of the above records today demonstrates:    Left Ventricle: Wall thickness is normal.    Left Ventricle: Wall motion is normal.    Left Ventricle: Doppler parameters consistent with mild diastolic  dysfunction and low to normal LA pressure.    Left Ventricle: Systolic function is normal. EF: 55-60%.    Left Atrium: Left atrium is mildly dilated.    No significant valvular abnormality.    ASSESSMENT AND PLAN:  1.  AVNRT: Occurred at [facility] with heart rates above 200 bpm.  Status post ablation 12/06/2020.  No further episodes of SVT.  Continue with current management.  2.  Paroxysmal atrial fibrillation: CHA2DS2-VASc of 2.  Currently on Eliquis, diltiazem, metoprolol.  It appears that SVT has been a trigger for her atrial fibrillation.  Due to that, we Adreanne Yono stop her Eliquis and diltiazem today.  3.  Obstructive sleep apnea: CPAP compliance encouraged   Current medicines are reviewed at length with  the patient today.   The patient does not have concerns regarding her medicines.  The following changes were made today: Stop Eliquis, diltiazem  Labs/ tests ordered today include:  Orders Placed This Encounter  Procedures   EKG 12-Lead      Disposition:   FU with Eyanna Mcgonagle 12 months  Signed, Devin Foskey Jorja Loa, MD  07/26/2021 3:23 PM     [facility] [address] [place] [address] [phone] (office) [phone] (fax)

## 2021-07-30 NOTE — Progress Notes (Signed)
Cardiology Office Note:    Date:  07/31/2021   ID:  MATLYN Rojas, DOB October 12, 1955, MRN [id number]  PCP:  Rip Harbour, NP  Cardiologist:  Shirlee More, MD    Referring MD: Rochel Brome, MD    ASSESSMENT:    1. SVT (supraventricular tachycardia) (HCC)   2. Paroxysmal atrial fibrillation ([place])   3. Hypertensive heart disease without heart failure    PLAN:    In order of problems listed above:  She is much improved after EP catheter ablation no recurrent clinical arrhythmia off calcium channel blocker off anticoagulant I think she needs to remain on a beta-blocker with a relatively rapid resting heart rate.  She is having redundant cardiology care I do not see a reason to see me at EP and I will withdraw from the case and see back in the future as needed. Stable hypertension continue current treatment including her diuretic as needed beta-blocker   Next appointment: She will be seeing EP in 1 year I will see her back in the future as needed   Medication Adjustments/Labs and Tests Ordered: Current medicines are reviewed at length with the patient today.  Concerns regarding medicines are outlined above.  No orders of the defined types were placed in this encounter.  No orders of the defined types were placed in this encounter.   Chief Complaint  Patient presents with   Follow-up    SVT and PAF   .[facility]  History of Present Illness:    Rebecca Rojas is a 65 y.o. adult with a hx of SVT and paroxysmal atrial fibrillation treated with EP catheter ablation 12/06/2020 right bundle branch block hypertensive heart disease with heart failure and anticoagulation complicated by severe epistasis.  She was last seen on 03/03/2021 with no recurrent arrhythmia. Compliance with diet, lifestyle and medications: Yes  She is quite pleased being off her anticoagulant as well as Cardizem but is noticed her resting heart rate is a little bit more rapid. No chest pain edema shortness  of breath syncope Past Medical History:  Diagnosis Date   Acute laryngopharyngitis 01/13/2020   Acute non-recurrent frontal sinusitis 09/22/2017   Allergic rhinitis 02/15/2021   Allergic rhinitis due to animal (cat) (dog) hair and dander 02/15/2021   Allergic rhinitis due to pollen 02/15/2021   Allergy    dust, environmental, feathers, has epipen, prn   Allergy-induced asthma    Anemia    Annual physical exam 02/15/2021   Arthritis    osteoarthritis   Arthritis of right acromioclavicular joint 06/15/2019   Asthma 12/28/2015   Asthma with exacerbation 04/27/2020   Atrial fibrillation ([place]) 05/20/2020   Atrial fibrillation with RVR ([place]) 05/20/2020   Back pain    Benign essential hypertension 12/28/2015   Bilateral lower extremity edema 08/04/2018   Chronic allergic conjunctivitis 02/15/2021   Encounter for screening colonoscopy 01/08/2017   Encounter for screening mammogram for breast cancer 02/15/2021   Glenoid labral tear, right, initial encounter 06/15/2019   Headache    Healthcare maintenance 01/08/2017   High risk medication use 12/28/2015   History of seasonal allergies    Hordeolum internum of right lower eyelid 02/14/2017   Hypertensive heart disease 06/02/2020   Insulin resistance 10/01/2019   Joint pain    Lower extremity edema    Lower respiratory infection (e.g., bronchitis, pneumonia, pneumonitis, pulmonitis) 05/10/2009   Qualifier: Diagnosis of  By: Gwenette Greet MD, Armando Reichert    Migraine headache 12/28/2015   Mild persistent asthma, uncomplicated [id number]  Mixed hyperlipidemia 12/28/2015   Morbid obesity ([place])    Myalgia 12/28/2015   Need for shingles vaccine 02/15/2021   Osteoarthritis    Other chest pain 02/15/2021   Other specified menopausal and perimenopausal disorders 02/15/2021   Pain of right eye 02/14/2017   Pain of right foot 08/30/2016   Persistent cough for 3 weeks or longer 10/11/2017   Pharyngitis 05/23/2018   Rotator cuff tear, right 06/15/2019    Shortness of breath    maybe with exertion   Skin lesion of foot 08/30/2016   Sleep apnea    uses cpap   Sleep apnea with use of continuous positive airway pressure (CPAP) 02/15/2021   Sore throat 05/11/2018   Unspecified open wound, left lower leg, initial encounter 08/04/2018   Vertigo    Vitamin D deficiency     Past Surgical History:  Procedure Laterality Date   ABDOMINAL HYSTERECTOMY     COLONOSCOPY  09/2010   normal   FRACTURE SURGERY     left 5th finger fx with bone graft repair   HEEL SPUR EXCISION     JOINT REPLACEMENT     KNEE ARTHROSCOPY Left    Left Knee lateral release     LIPOMA EXCISION     SHOULDER ARTHROSCOPY WITH ROTATOR CUFF REPAIR AND SUBACROMIAL DECOMPRESSION Right 06/15/2019   Procedure: SHOULDER ARTHROSCOPY WITH ROTATOR CUFF REPAIR AND SUBACROMIAL DECOMPRESSION;  Surgeon: Dorna Leitz, MD;  Location: [facility];  Service: Orthopedics;  Laterality: Right;   SVT ABLATION N/A 12/06/2020   Procedure: SVT ABLATION;  Surgeon: Constance Haw, MD;  Location: [facility];  Service: Cardiovascular;  Laterality: N/A;   TONSILLECTOMY     TOTAL KNEE ARTHROPLASTY  08/03/2011   Procedure: TOTAL KNEE ARTHROPLASTY;  Surgeon: Alta Corning;  Location: [facility];  Service: Orthopedics;  Laterality: Right;  COMPUTER ASSISTED TOTAL KNEE REPLACEMENTwith revision tibial component   TUBAL LIGATION      Current Medications: Current Meds  Medication Sig   acetaminophen (TYLENOL) 500 MG tablet Take 1,000 mg by mouth every 6 (six) hours as needed for moderate pain.   albuterol (PROVENTIL HFA;VENTOLIN HFA) 108 (90 BASE) MCG/ACT inhaler Inhale 1-2 puffs into the lungs every 6 (six) hours as needed for wheezing or shortness of breath.    azelastine (OPTIVAR) 0.05 % ophthalmic solution Place 1 drop into both eyes 2 (two) times daily as needed for allergies (allergies).   EPINEPHrine 0.3 mg/0.3 mL IJ SOAJ injection Inject 0.3 mg into the muscle as needed for anaphylaxis.    fluticasone furoate-vilanterol (BREO ELLIPTA) 100-25 MCG/INH AEPB Inhale 1 puff into the lungs daily.   furosemide (LASIX) 20 MG tablet Take 40 mg by mouth daily as needed for edema (fluid retention.).   ipratropium (ATROVENT) 0.02 % nebulizer solution Take 3 mLs by nebulization every 6 (six) hours as needed for wheezing or shortness of breath.   levocetirizine (XYZAL) 5 MG tablet Take 5 mg by mouth every evening.   Magnesium 500 MG CAPS Take 500 mg by mouth daily.   metoprolol succinate (TOPROL XL) 25 MG 24 hr tablet Take 1 tablet (25 mg total) by mouth daily.   montelukast (SINGULAIR) 10 MG tablet Take 10 mg by mouth at bedtime.   Semaglutide,0.25 or 0.5MG /DOS, (OZEMPIC, 0.25 OR 0.5 MG/DOSE,) 2 MG/1.5ML SOPN Inject 0.5 mg into the skin once a week.   Vitamin D, Ergocalciferol, (DRISDOL) 1.25 MG (50000 UNIT) CAPS capsule Take 1 capsule (50,000 Units total) by mouth every 7 (seven)  days.     Allergies:   Sulfa antibiotics, Cefdinir, Erythromycin, and Hydrocodone bit-homatrop mbr   Social History   Socioeconomic History   Marital status: Married    Spouse name: Manami Tutor   Number of children: 2   Years of education: Not on file   Highest education level: Not on file  Occupational History   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  Tobacco Use   Smoking status: Never    Passive exposure: Never   Smokeless tobacco: Never  Vaping Use   Vaping Use: Never used  Substance and Sexual Activity   Alcohol use: Not Currently   Drug use: Never   Sexual activity: Yes  Other Topics Concern   Not on file  Social History Narrative   Not on file   Social Determinants of Health   Financial Resource Strain: Not on file  Food Insecurity: Not on file  Transportation Needs: Not on file  Physical Activity: Not on file  Stress: Not on file  Social Connections: Not on file     Family History: The patient's family history includes Cancer in her father; Heart attack in her mother; Heart  disease in her mother; Hyperlipidemia in her mother; Hypertension in her mother; Liver disease in her father; Obesity in her father and mother; Stroke in her mother. There is no history of Anesthesia problems, Hypotension, Malignant hyperthermia, Pseudochol deficiency, Colon cancer, Colon polyps, Esophageal cancer, Rectal cancer, or Stomach cancer. ROS:   Please see the history of present illness.    All other systems reviewed and are negative.  EKGs/Labs/Other Studies Reviewed:    The following studies were reviewed today:   Recent Labs: 02/13/2021: ALT 17; BUN 13; Creatinine, Ser 0.71; Hemoglobin 13.3; Platelets 316; Potassium 4.4; Sodium 143; TSH 4.450  Recent Lipid Panel    Component Value Date/Time   CHOL 180 02/13/2021 0751   TRIG 165 (H) 02/13/2021 0751   HDL 41 02/13/2021 0751   CHOLHDL 4.4 02/13/2021 0751   LDLCALC 110 (H) 02/13/2021 0751    Physical Exam:    VS:  BP 134/90   Pulse (!) 112   Ht 5\' 1"  (1.549 m)   Wt 264 lb 3.2 oz (119.8 kg)   SpO2 99%   BMI 49.92 kg/m     Wt Readings from Last 3 Encounters:  07/31/21 264 lb 3.2 oz (119.8 kg)  07/26/21 267 lb (121.1 kg)  07/17/21 264 lb (119.7 kg)     GEN:   Well nourished, well developed in no acute distress HEENT: Normal NECK: No JVD; No carotid bruits LYMPHATICS: No lymphadenopathy CARDIAC: RRN/A R, no murmurs, rubs, gallops RESPIRATORY:  Clear to auscultation without rales, wheezing or rhonchi  ABDOMEN: Soft, non-tender, non-distended MUSCULOSKELETAL:  No edema; No deformity  SKIN: Warm and dry NEUROLOGIC:  Alert and oriented x 3 PSYCHIATRIC:  Normal affect    Signed, 07/19/21, MD  07/31/2021 2:47 PM    [facility]

## 2021-07-31 ENCOUNTER — Ambulatory Visit (INDEPENDENT_AMBULATORY_CARE_PROVIDER_SITE_OTHER): Payer: Medicare Other | Admitting: Cardiology

## 2021-07-31 ENCOUNTER — Ambulatory Visit: Payer: 59 | Admitting: Cardiology

## 2021-07-31 ENCOUNTER — Encounter: Payer: Self-pay | Admitting: Cardiology

## 2021-07-31 ENCOUNTER — Other Ambulatory Visit: Payer: Self-pay

## 2021-07-31 VITALS — BP 134/90 | HR 112 | Ht 61.0 in | Wt 264.2 lb

## 2021-07-31 DIAGNOSIS — I471 Supraventricular tachycardia, unspecified: Secondary | ICD-10-CM

## 2021-07-31 DIAGNOSIS — I119 Hypertensive heart disease without heart failure: Secondary | ICD-10-CM | POA: Diagnosis not present

## 2021-07-31 DIAGNOSIS — I480 Paroxysmal atrial fibrillation: Secondary | ICD-10-CM

## 2021-07-31 NOTE — Patient Instructions (Signed)
Medication Instructions:  Your physician has recommended you make the following change in your medication:   Take 81 mg coated aspirin daily.  *If you need a refill on your cardiac medications before your next appointment, please call your pharmacy*   Lab Work: None ordered If you have labs (blood work) drawn today and your tests are completely normal, you will receive your results only by: MyChart Message (if you have MyChart) OR A paper copy in the mail If you have any lab test that is abnormal or we need to change your treatment, we will call you to review the results.   Testing/Procedures: None ordered   Follow-Up: At [facility], you and your health needs are our priority.  As part of our continuing mission to provide you with exceptional heart care, we have created designated Provider Care Teams.  These Care Teams include your primary Cardiologist (physician) and Advanced Practice Providers (APPs -  Physician Assistants and Nurse Practitioners) who all work together to provide you with the care you need, when you need it.  We recommend signing up for the patient portal called "MyChart".  Sign up information is provided on this After Visit Summary.  MyChart is used to connect with patients for Virtual Visits (Telemedicine).  Patients are able to view lab/test results, encounter notes, upcoming appointments, etc.  Non-urgent messages can be sent to your provider as well.   To learn more about what you can do with MyChart, go to [email or web address].    Your next appointment:   As needed  The format for your next appointment:   In Person  Provider:   Norman Herrlich, MD   Other Instructions NA

## 2021-08-02 ENCOUNTER — Ambulatory Visit: Payer: 59 | Admitting: Orthopedic Surgery

## 2021-09-11 ENCOUNTER — Ambulatory Visit (INDEPENDENT_AMBULATORY_CARE_PROVIDER_SITE_OTHER): Payer: Medicare Other | Admitting: Nurse Practitioner

## 2021-09-11 ENCOUNTER — Other Ambulatory Visit: Payer: Self-pay

## 2021-09-11 ENCOUNTER — Encounter: Payer: Self-pay | Admitting: Nurse Practitioner

## 2021-09-11 VITALS — BP 138/90 | HR 87 | Temp 96.0°F | Ht 61.0 in | Wt 263.0 lb

## 2021-09-11 DIAGNOSIS — I119 Hypertensive heart disease without heart failure: Secondary | ICD-10-CM

## 2021-09-11 DIAGNOSIS — E559 Vitamin D deficiency, unspecified: Secondary | ICD-10-CM

## 2021-09-11 DIAGNOSIS — R7301 Impaired fasting glucose: Secondary | ICD-10-CM

## 2021-09-11 DIAGNOSIS — I10 Essential (primary) hypertension: Secondary | ICD-10-CM | POA: Diagnosis not present

## 2021-09-11 DIAGNOSIS — Z6841 Body Mass Index (BMI) 40.0 and over, adult: Secondary | ICD-10-CM

## 2021-09-11 NOTE — Patient Instructions (Addendum)
Continue medications Increase physical activity Recommend routine eye exam Follow-up in 58-month   Semaglutide Injection (Weight Management) What is this medication? SEMAGLUTIDE (SEM a GLOO tide) promotes weight loss. It may also be used to maintain weight loss. It works by decreasing appetite. Changes to diet and exercise are often combined with this medication. This medicine may be used for other purposes; ask your health care provider or pharmacist if you have questions. COMMON BRAND NAME(S): [id number] What should I tell my care team before I take this medication? They need to know if you have any of these conditions: Endocrine tumors (MEN 2) or if someone in your family had these tumors Eye disease, vision problems Gallbladder disease History of depression or mental health disease History of pancreatitis Kidney disease Stomach or intestine problems Suicidal thoughts, plans, or attempt; a previous suicide attempt by you or a family member Thyroid cancer or if someone in your family had thyroid cancer An unusual or allergic reaction to semaglutide, other medications, foods, dyes, or preservatives Pregnant or trying to get pregnant Breast-feeding How should I use this medication? This medication is injected under the skin. You will be taught how to prepare and give it. Take it as directed on the prescription label. It is given once every week (every 7 days). Keep taking it unless your care team tells you to stop. It is important that you put your used needles and pens in a special sharps container. Do not put them in a trash can. If you do not have a sharps container, call your pharmacist or care team to get one. A special MedGuide will be given to you by the pharmacist with each prescription and refill. Be sure to read this information carefully each time. This medication comes with INSTRUCTIONS FOR USE. Ask your pharmacist for directions on how to use this medication. Read the  information carefully. Talk to your pharmacist or care team if you have questions. Talk to your care team about the use of this medication in children. Special care may be needed. Overdosage: If you think you have taken too much of this medicine contact a poison control center or emergency room at once. NOTE: This medicine is only for you. Do not share this medicine with others. What if I miss a dose? If you miss a dose and the next scheduled dose is more than 2 days away, take the missed dose as soon as possible. If you miss a dose and the next scheduled dose is less than 2 days away, do not take the missed dose. Take the next dose at your regular time. Do not take double or extra doses. If you miss your dose for 2 weeks or more, take the next dose at your regular time or call your care team to talk about how to restart this medication. What may interact with this medication? Insulin and other medications for diabetes This list may not describe all possible interactions. Give your health care provider a list of all the medicines, herbs, non-prescription drugs, or dietary supplements you use. Also tell them if you smoke, drink alcohol, or use illegal drugs. Some items may interact with your medicine. What should I watch for while using this medication? Visit your care team for regular checks on your progress. It may be some time before you see the benefit from this medication. Drink plenty of fluids while taking this medication. Check with your care team if you have severe diarrhea, nausea, and vomiting, or if you sweat  a lot. The loss of too much body fluid may make it dangerous for you to take this medication. This medication may affect blood sugar levels. Ask your care team if changes in diet or medications are needed if you have diabetes. If you or your family notice any changes in your behavior, such as new or worsening depression, thoughts of harming yourself, anxiety, other unusual or disturbing  thoughts, or memory loss, call your care team right away. Women should inform their care team if they wish to become pregnant or think they might be pregnant. Losing weight while pregnant is not advised and may cause harm to the unborn child. Talk to your care team for more information. What side effects may I notice from receiving this medication? Side effects that you should report to your care team as soon as possible: Allergic reactions--skin rash, itching, hives, swelling of the face, lips, tongue, or throat Change in vision Dehydration--increased thirst, dry mouth, feeling faint or lightheaded, headache, dark yellow or brown urine Gallbladder problems--severe stomach pain, nausea, vomiting, fever Heart palpitations--rapid, pounding, or irregular heartbeat Kidney injury--decrease in the amount of urine, swelling of the ankles, hands, or feet Pancreatitis--severe stomach pain that spreads to your back or gets worse after eating or when touched, fever, nausea, vomiting Thoughts of suicide or self-harm, worsening mood, feelings of depression Thyroid cancer--new mass or lump in the neck, pain or trouble swallowing, trouble breathing, hoarseness Side effects that usually do not require medical attention (report to your care team if they continue or are bothersome): Diarrhea Loss of appetite Nausea Stomach pain Vomiting This list may not describe all possible side effects. Call your doctor for medical advice about side effects. You may report side effects to FDA at 1-800-FDA-1088. Where should I keep my medication? Keep out of the reach of children and pets. Refrigeration (preferred): Store in the refrigerator. Do not freeze. Keep this medication in the original container until you are ready to take it. Get rid of any unused medication after the expiration date. Room temperature: If needed, prior to cap removal, the pen can be stored at room temperature for up to 28 days. Protect from light. If  it is stored at room temperature, get rid of any unused medication after 28 days or after it expires, whichever is first. It is important to get rid of the medication as soon as you no longer need it or it is expired. You can do this in two ways: Take the medication to a medication take-back program. Check with your pharmacy or law enforcement to find a location. If you cannot return the medication, follow the directions in the [place]. NOTE: This sheet is a summary. It may not cover all possible information. If you have questions about this medicine, talk to your doctor, pharmacist, or health care provider.  2022 Elsevier/Gold Standard (2020-11-11 00:00:00)

## 2021-09-11 NOTE — Progress Notes (Signed)
Subjective:  Patient ID: Rebecca Rojas, adult    DOB: Oct 28, 1955  Age: 66 y.o. MRN: [id number]  Chief Complaint  Patient presents with   weight management  Impaired fasting glucose HTN   HPI  [name] is a 66 year old Caucasian female that presents for follow-up of impaired fasting glucose, hypertension, and Vitamin D deficiency. She is up-to-date on immunizations, screening mammogram, colonoscopy and DEXA scan. She is due for an eye exam.  [name] is in to discuss weight management. Current BMI 49.69, weight 263 pounds. she has chronic health problems related to obesity including bilateral knee pain, hyperlipidemia, hypertensive heart disease, and fatigue. She is limited with exercise due to chronic orthopedic conditions. She has been doing chair yoga and recumbent bike recently. She attempts to eat a heart healthy diet and drink 48-54 ounces of water daily.   Hypertension, follow-up: She was last seen for hypertension 7 months ago. She is followed by Dr Bettina Gavia and [facility], cardiology. Underwent cardiac ablation last April 2022.States she is doing well. BP at that visit was 130/70. Management since that visit includes Metoprolol.  She reports excellent compliance with treatment. She is not having side effects.  She is following a Low Sodium diet. She is exercising. She does not smoke.  Use of agents associated with hypertension: NSAIDS.   Outside blood pressures are not being checked. Symptoms: No chest pain No chest pressure  No palpitations No syncope  No dyspnea No orthopnea  No paroxysmal nocturnal dyspnea No lower extremity edema   Pertinent labs: Lab Results  Component Value Date   CHOL 180 02/13/2021   HDL 41 02/13/2021   LDLCALC 110 (H) 02/13/2021   TRIG 165 (H) 02/13/2021   CHOLHDL 4.4 02/13/2021   Lab Results  Component Value Date   NA 143 02/13/2021   K 4.4 02/13/2021   CREATININE 0.71 02/13/2021   EGFR 95 02/13/2021   GFRNONAA 93 06/13/2020   GLUCOSE 105  (H) 02/13/2021     The 10-year ASCVD risk score (Arnett DK, et al., 2019) is: 9.2%    Impaired fasting glucose, follow-up  Lab Results  Component Value Date   HGBA1C 5.5 02/13/2021   HGBA1C 5.5 09/01/2019   GLUCOSE 105 (H) 02/13/2021   GLUCOSE 83 11/24/2020   GLUCOSE 120 (H) 06/13/2020    Last seen for for this7 months ago.  Management since that visit includes heart healthy diet. Current symptoms include none and have been stable.  Prior visit with dietician: no Current diet: in general, a "healthy" diet   Current exercise:  yoga  Vitamin D deficiency, follow-up  Lab Results  Component Value Date   VD25OH 16.0 (L) 02/13/2021   VD25OH 17.8 (L) 09/01/2019   CALCIUM 8.6 (L) 02/13/2021   CALCIUM 8.7 11/24/2020     She was last seen for vitamin D deficiency 7 months ago.  Management since that visit includes Vit D rich diet She reports good compliance with treatment. She is not having side effects.  Symptoms: No change in energy level No numbness or tingling  Yes bone pain No unexplained fracture     Pertinent Labs:    Component Value Date/Time   CHOL 180 02/13/2021 0751   TRIG 165 (H) 02/13/2021 0751   CHOLHDL 4.4 02/13/2021 0751   CREATININE 0.71 02/13/2021 0751    Lipid/Cholesterol, Follow-up  Last lipid panel Other pertinent labs  Lab Results  Component Value Date   CHOL 180 02/13/2021   HDL 41 02/13/2021   LDLCALC 110 (H)  02/13/2021   TRIG 165 (H) 02/13/2021   CHOLHDL 4.4 02/13/2021   Lab Results  Component Value Date   ALT 17 02/13/2021   AST 14 02/13/2021   PLT 316 02/13/2021   TSH 4.450 02/13/2021     She was last seen for this 7 months ago.  Management since that visit includes heart healthy diet.  She reports excellent compliance with treatment. She is not having side effects.   Symptoms: No chest pain No chest pressure/discomfort  No dyspnea No lower extremity edema  No numbness or tingling of extremity No orthopnea  No  palpitations No paroxysmal nocturnal dyspnea  No speech difficulty No syncope   Current diet: in general, a "healthy" diet   Current exercise:  yoga  The 10-year ASCVD risk score (Arnett DK, et al., 2019) is: 9.2%           Current Outpatient Medications on File Prior to Visit  Medication Sig Dispense Refill   acetaminophen (TYLENOL) 500 MG tablet Take 1,000 mg by mouth every 6 (six) hours as needed for moderate pain.     albuterol (PROVENTIL HFA;VENTOLIN HFA) 108 (90 BASE) MCG/ACT inhaler Inhale 1-2 puffs into the lungs every 6 (six) hours as needed for wheezing or shortness of breath.      azelastine (OPTIVAR) 0.05 % ophthalmic solution Place 1 drop into both eyes 2 (two) times daily as needed for allergies (allergies).     EPINEPHrine 0.3 mg/0.3 mL IJ SOAJ injection Inject 0.3 mg into the muscle as needed for anaphylaxis.     fluticasone furoate-vilanterol (BREO ELLIPTA) 100-25 MCG/INH AEPB Inhale 1 puff into the lungs daily.     furosemide (LASIX) 20 MG tablet Take 40 mg by mouth daily as needed for edema (fluid retention.).     ipratropium (ATROVENT) 0.02 % nebulizer solution Take 3 mLs by nebulization every 6 (six) hours as needed for wheezing or shortness of breath.     levocetirizine (XYZAL) 5 MG tablet Take 5 mg by mouth every evening.     Magnesium 500 MG CAPS Take 500 mg by mouth daily.     metoprolol succinate (TOPROL XL) 25 MG 24 hr tablet Take 1 tablet (25 mg total) by mouth daily. 90 tablet 3   montelukast (SINGULAIR) 10 MG tablet Take 10 mg by mouth at bedtime.     Semaglutide, 1 MG/DOSE, (OZEMPIC, 1 MG/DOSE,) 4 MG/3ML SOPN Inject 1 mg into the skin once a week. Inject 1 mg into skin once a week for four weeks (Patient not taking: Reported on 07/26/2021) 3 mL 0   Semaglutide, 2 MG/DOSE, 8 MG/3ML SOPN Inject 2 mg as directed once a week. (Patient not taking: Reported on 07/31/2021) 3 mL 1   Vitamin D, Ergocalciferol, (DRISDOL) 1.25 MG (50000 UNIT) CAPS capsule Take 1 capsule  (50,000 Units total) by mouth every 7 (seven) days. 12 capsule 3   No current facility-administered medications on file prior to visit.   Past Medical History:  Diagnosis Date   Acute laryngopharyngitis 01/13/2020   Acute non-recurrent frontal sinusitis 09/22/2017   Allergic rhinitis 02/15/2021   Allergic rhinitis due to animal (cat) (dog) hair and dander 02/15/2021   Allergic rhinitis due to pollen 02/15/2021   Allergy    dust, environmental, feathers, has epipen, prn   Allergy-induced asthma    Anemia    Annual physical exam 02/15/2021   Arthritis    osteoarthritis   Arthritis of right acromioclavicular joint 06/15/2019   Asthma 12/28/2015   Asthma  with exacerbation 04/27/2020   Atrial fibrillation ([place]) 05/20/2020   Atrial fibrillation with RVR ([place]) 05/20/2020   Back pain    Benign essential hypertension 12/28/2015   Bilateral lower extremity edema 08/04/2018   Chronic allergic conjunctivitis 02/15/2021   Encounter for screening colonoscopy 01/08/2017   Encounter for screening mammogram for breast cancer 02/15/2021   Glenoid labral tear, right, initial encounter 06/15/2019   Headache    Healthcare maintenance 01/08/2017   High risk medication use 12/28/2015   History of seasonal allergies    Hordeolum internum of right lower eyelid 02/14/2017   Hypertensive heart disease 06/02/2020   Insulin resistance 10/01/2019   Joint pain    Lower extremity edema    Lower respiratory infection (e.g., bronchitis, pneumonia, pneumonitis, pulmonitis) 05/10/2009   Qualifier: Diagnosis of  By: Gwenette Greet MD, Armando Reichert    Migraine headache 12/28/2015   Mild persistent asthma, uncomplicated 12/23/3974   Mixed hyperlipidemia 12/28/2015   Morbid obesity ([place])    Myalgia 12/28/2015   Need for shingles vaccine 02/15/2021   Osteoarthritis    Other chest pain 02/15/2021   Other specified menopausal and perimenopausal disorders 02/15/2021   Pain of right eye 02/14/2017   Pain of right foot 08/30/2016    Persistent cough for 3 weeks or longer 10/11/2017   Pharyngitis 05/23/2018   Rotator cuff tear, right 06/15/2019   Shortness of breath    maybe with exertion   Skin lesion of foot 08/30/2016   Sleep apnea    uses cpap   Sleep apnea with use of continuous positive airway pressure (CPAP) 02/15/2021   Sore throat 05/11/2018   Unspecified open wound, left lower leg, initial encounter 08/04/2018   Vertigo    Vitamin D deficiency    Past Surgical History:  Procedure Laterality Date   ABDOMINAL HYSTERECTOMY     COLONOSCOPY  09/2010   normal   FRACTURE SURGERY     left 5th finger fx with bone graft repair   HEEL SPUR EXCISION     JOINT REPLACEMENT     KNEE ARTHROSCOPY Left    Left Knee lateral release     LIPOMA EXCISION     SHOULDER ARTHROSCOPY WITH ROTATOR CUFF REPAIR AND SUBACROMIAL DECOMPRESSION Right 06/15/2019   Procedure: SHOULDER ARTHROSCOPY WITH ROTATOR CUFF REPAIR AND SUBACROMIAL DECOMPRESSION;  Surgeon: Dorna Leitz, MD;  Location: [facility];  Service: Orthopedics;  Laterality: Right;   SVT ABLATION N/A 12/06/2020   Procedure: SVT ABLATION;  Surgeon: Constance Haw, MD;  Location: [place] [facility];  Service: Cardiovascular;  Laterality: N/A;   TONSILLECTOMY     TOTAL KNEE ARTHROPLASTY  08/03/2011   Procedure: TOTAL KNEE ARTHROPLASTY;  Surgeon: Alta Corning;  Location: [facility];  Service: Orthopedics;  Laterality: Right;  COMPUTER ASSISTED TOTAL KNEE REPLACEMENTwith revision tibial component   TUBAL LIGATION      Family History  Problem Relation Age of Onset   Hypertension Mother    Heart disease Mother    Heart attack Mother    Hyperlipidemia Mother    Stroke Mother    Obesity Mother    Cancer Father    Liver disease Father    Obesity Father    Anesthesia problems Neg Hx    Hypotension Neg Hx    Malignant hyperthermia Neg Hx    Pseudochol deficiency Neg Hx    Colon cancer Neg Hx    Colon polyps Neg Hx    Esophageal cancer Neg Hx    Rectal cancer Neg  Hx     Stomach cancer Neg Hx    Social History   Socioeconomic History   Marital status: Married    Spouse name: Lacretia Tindall   Number of children: 2   Years of education: Not on file   Highest education level: Not on file  Occupational History   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 kits  Tobacco Use   Smoking status: Never    Passive exposure: Never   Smokeless tobacco: Never  Vaping Use   Vaping Use: Never used  Substance and Sexual Activity   Alcohol use: Not Currently   Drug use: Never   Sexual activity: Yes  Other Topics Concern   Not on file  Social History Narrative   Not on file   Social Determinants of Health   Financial Resource Strain: Not on file  Food Insecurity: Not on file  Transportation Needs: Not on file  Physical Activity: Not on file  Stress: Not on file  Social Connections: Not on file    Review of Systems  Constitutional:  Negative for chills, diaphoresis, fatigue and fever.  HENT:  Negative for congestion, ear pain and sore throat.   Eyes: Negative.   Respiratory:  Negative for cough and shortness of breath.   Cardiovascular:  Negative for chest pain and leg swelling.  Gastrointestinal:  Negative for abdominal pain, constipation, diarrhea, nausea and vomiting.  Genitourinary:  Negative for dysuria and urgency.  Musculoskeletal:  Positive for arthralgias (chronic) and back pain (chronic). Negative for myalgias.  Allergic/Immunologic: Positive for environmental allergies.  Neurological:  Negative for dizziness and headaches.  Hematological: Negative.   Psychiatric/Behavioral:  Negative for dysphoric mood.     Objective:  Pulse 87    Temp (!) 96 F (35.6 C)    Ht _0  (1.549 m)    Wt 263 lb (119.3 kg)    SpO2 99%    BMI 49.69 kg/m  BP 138/90    Pulse 87    Temp (!) 96 F (35.6 C)    Ht _1  (1.549 m)    Wt 263 lb (119.3 kg)    SpO2 99%    BMI 49.69 kg/m    BP/Weight 09/11/2021 07/31/2021 98/10/3823  Systolic BP - 053 976  Diastolic BP - 90 734   Wt. (Lbs) 263 264.2 267  BMI 49.69 49.92 50.45    Physical Exam Vitals reviewed.  Constitutional:      Appearance: She is obese.  HENT:     Mouth/Throat:     Mouth: Mucous membranes are moist.  Eyes:     Comments: Eyeglasses in place  Cardiovascular:     Rate and Rhythm: Normal rate and regular rhythm.     Pulses: Normal pulses.     Heart sounds: Normal heart sounds.  Pulmonary:     Effort: Pulmonary effort is normal.     Breath sounds: Normal breath sounds.  Abdominal:     General: Bowel sounds are normal.     Palpations: Abdomen is soft.  Musculoskeletal:        General: Tenderness present.  Skin:    General: Skin is warm and dry.     Capillary Refill: Capillary refill takes less than 2 seconds.  Neurological:     Mental Status: She is alert and oriented to person, place, and time. Mental status is at baseline.  Psychiatric:        Mood and Affect: Mood normal.        Behavior: Behavior normal.  Lab Results  Component Value Date   WBC 7.9 02/13/2021   HGB 13.3 02/13/2021   HCT 41.0 02/13/2021   PLT 316 02/13/2021   GLUCOSE 105 (H) 02/13/2021   CHOL 180 02/13/2021   TRIG 165 (H) 02/13/2021   HDL 41 02/13/2021   LDLCALC 110 (H) 02/13/2021   ALT 17 02/13/2021   AST 14 02/13/2021   NA 143 02/13/2021   K 4.4 02/13/2021   CL 105 02/13/2021   CREATININE 0.71 02/13/2021   BUN 13 02/13/2021   CO2 24 02/13/2021   TSH 4.450 02/13/2021   INR 1.53 (H) 08/06/2011   HGBA1C 5.5 02/13/2021      Assessment & Plan:  1. Hypertensive heart disease without heart failure - Lipid panel - TSH -CBC -CMP   2. Benign essential hypertension-not at goal - CBC with Differential/Platelet - Comprehensive metabolic panel - Lipid panel - TSH  3. Impaired fasting glucose - Comprehensive metabolic panel - TSH  4. Vitamin D deficiency-not at goal - Vitamin D, 25-hydroxy -Vitamin D rich diet  5. Class 3 severe obesity with serious comorbidity and body mass index  (BMI) of 45.0 to 49.9 in adult, unspecified obesity type (HCC) - CBC with Differential/Platelet - Comprehensive metabolic panel -daily physical activity      Continue medications Increase physical activity Recommend routine eye exam Follow-up in 5-month    Follow-up: 338-month An After Visit Summary was printed and given to the patient.   I, ShRip HarbourNP, have reviewed all documentation for this visit. The documentation on 09/11/21 for the exam, diagnosis, procedures, and orders are all accurate and complete.    Signed,  ShRip HarbourNP [phone]

## 2021-09-12 LAB — COMPREHENSIVE METABOLIC PANEL
ALT: 12 IU/L (ref 0–32)
AST: 11 IU/L (ref 0–40)
Albumin/Globulin Ratio: 1.8 (ref 1.2–2.2)
Albumin: 4.2 g/dL (ref 3.8–4.8)
Alkaline Phosphatase: 77 IU/L (ref 44–121)
BUN/Creatinine Ratio: 18 (ref 12–28)
BUN: 14 mg/dL (ref 8–27)
Bilirubin Total: 0.8 mg/dL (ref 0.0–1.2)
CO2: 26 mmol/L (ref 20–29)
Calcium: 9.1 mg/dL (ref 8.7–10.3)
Chloride: 105 mmol/L (ref 96–106)
Creatinine, Ser: 0.77 mg/dL (ref 0.57–1.00)
Globulin, Total: 2.3 g/dL (ref 1.5–4.5)
Glucose: 89 mg/dL (ref 70–99)
Potassium: 5.1 mmol/L (ref 3.5–5.2)
Sodium: 142 mmol/L (ref 134–144)
Total Protein: 6.5 g/dL (ref 6.0–8.5)
eGFR: 86 mL/min/{1.73_m2} (ref >59)

## 2021-09-12 LAB — CBC WITH DIFFERENTIAL/PLATELET
Basophils Absolute: 0.1 10*3/uL (ref 0.0–0.2)
Basos: 1 %
EOS (ABSOLUTE): 0.2 10*3/uL (ref 0.0–0.4)
Eos: 3 %
Hematocrit: 42.6 % (ref 34.0–46.6)
Hemoglobin: 14 g/dL (ref 11.1–15.9)
Immature Grans (Abs): 0 10*3/uL (ref 0.0–0.1)
Immature Granulocytes: 0 %
Lymphocytes Absolute: 3.4 10*3/uL — ABNORMAL HIGH (ref 0.7–3.1)
Lymphs: 38 %
MCH: 29.2 pg (ref 26.6–33.0)
MCHC: 32.9 g/dL (ref 31.5–35.7)
MCV: 89 fL (ref 79–97)
Monocytes Absolute: 0.6 10*3/uL (ref 0.1–0.9)
Monocytes: 7 %
Neutrophils Absolute: 4.6 10*3/uL (ref 1.4–7.0)
Neutrophils: 51 %
Platelets: 322 10*3/uL (ref 150–450)
RBC: 4.79 x10E6/uL (ref 3.77–5.28)
RDW: 13.1 % (ref 11.7–15.4)
WBC: 8.9 10*3/uL (ref 3.4–10.8)

## 2021-09-12 LAB — LIPID PANEL
Chol/HDL Ratio: 4 ratio (ref 0.0–4.4)
Cholesterol, Total: 169 mg/dL (ref 100–199)
HDL: 42 mg/dL (ref >39)
LDL Chol Calc (NIH): 105 mg/dL — ABNORMAL HIGH (ref 0–99)
Triglycerides: 123 mg/dL (ref 0–149)
VLDL Cholesterol Cal: 22 mg/dL (ref 5–40)

## 2021-09-12 LAB — TSH: TSH: 2.42 u[IU]/mL (ref 0.450–4.500)

## 2021-09-12 LAB — CARDIOVASCULAR RISK ASSESSMENT

## 2021-09-12 LAB — VITAMIN D 25 HYDROXY (VIT D DEFICIENCY, FRACTURES): Vit D, 25-Hydroxy: 31 ng/mL (ref 30.0–100.0)

## 2021-09-13 ENCOUNTER — Other Ambulatory Visit: Payer: Self-pay | Admitting: Cardiology

## 2021-10-19 ENCOUNTER — Encounter: Payer: Self-pay | Admitting: Cardiology

## 2021-10-20 ENCOUNTER — Encounter: Payer: Self-pay | Admitting: Cardiology

## 2021-10-20 NOTE — Telephone Encounter (Signed)
Pt aware that Dr. Dulce Sellar was the right MD to call for approval of ASA. ?Made aware from EP standpoint we don't need to advise. ?Patient verbalized understanding and agreeable to plan.  ? ?

## 2021-10-25 ENCOUNTER — Ambulatory Visit (INDEPENDENT_AMBULATORY_CARE_PROVIDER_SITE_OTHER): Payer: Medicare Other | Admitting: Podiatry

## 2021-10-25 ENCOUNTER — Encounter: Payer: Self-pay | Admitting: Podiatry

## 2021-10-25 ENCOUNTER — Other Ambulatory Visit: Payer: Self-pay

## 2021-10-25 DIAGNOSIS — M5416 Radiculopathy, lumbar region: Secondary | ICD-10-CM

## 2021-10-25 NOTE — Addendum Note (Signed)
Addended by: Hedy Jacob on: 10/25/2021 04:37 PM ? ? Modules accepted: Orders ? ?

## 2021-10-25 NOTE — Progress Notes (Signed)
?Subjective:  ?Patient ID: Rebecca Rojas, adult    DOB: 01/17/1956,   MRN: [id number] ? ?Chief Complaint  ?Patient presents with  ? Foot Pain  ?  Right foot numbness , in left foot - patient states this has been a constant feeling   ? ? ?66 y.o. adult presents for bilateral foot numbness relates right is worse than left. Relates constant numbness. She denies redness or swelling. She is not diabetic. She does have a history of sciatic nerve problem and radiculopathy in L5-S1.  Denies any other pedal complaints. Denies n/v/f/c.  ? ?Past Medical History:  ?Diagnosis Date  ? Acute laryngopharyngitis 01/13/2020  ? Acute non-recurrent frontal sinusitis 09/22/2017  ? Allergic rhinitis 02/15/2021  ? Allergic rhinitis due to animal (cat) (dog) hair and dander 02/15/2021  ? Allergic rhinitis due to pollen 02/15/2021  ? Allergy   ? dust, environmental, feathers, has epipen, prn  ? Allergy-induced asthma   ? Anemia   ? Annual physical exam 02/15/2021  ? Arthritis   ? osteoarthritis  ? Arthritis of right acromioclavicular joint 06/15/2019  ? Asthma 12/28/2015  ? Asthma with exacerbation 04/27/2020  ? Atrial fibrillation ([place]) 05/20/2020  ? Atrial fibrillation with RVR ([place]) 05/20/2020  ? Back pain   ? Benign essential hypertension 12/28/2015  ? Bilateral lower extremity edema 08/04/2018  ? Chronic allergic conjunctivitis 02/15/2021  ? Encounter for screening colonoscopy 01/08/2017  ? Encounter for screening mammogram for breast cancer 02/15/2021  ? Glenoid labral tear, right, initial encounter 06/15/2019  ? Headache   ? Healthcare maintenance 01/08/2017  ? High risk medication use 12/28/2015  ? History of seasonal allergies   ? Hordeolum internum of right lower eyelid 02/14/2017  ? Hypertensive heart disease 06/02/2020  ? Insulin resistance 10/01/2019  ? Joint pain   ? Lower extremity edema   ? Lower respiratory infection (e.g., bronchitis, pneumonia, pneumonitis, pulmonitis) 05/10/2009  ? Qualifier: Diagnosis of  By: Gwenette Greet MD,  Armando Reichert   ? Migraine headache 12/28/2015  ? Mild persistent asthma, uncomplicated [id number]  ? Mixed hyperlipidemia 12/28/2015  ? Morbid obesity ([place])   ? Myalgia 12/28/2015  ? Need for shingles vaccine 02/15/2021  ? Osteoarthritis   ? Other chest pain 02/15/2021  ? Other specified menopausal and perimenopausal disorders 02/15/2021  ? Pain of right eye 02/14/2017  ? Pain of right foot 08/30/2016  ? Persistent cough for 3 weeks or longer 10/11/2017  ? Pharyngitis 05/23/2018  ? Rotator cuff tear, right 06/15/2019  ? Shortness of breath   ? maybe with exertion  ? Skin lesion of foot 08/30/2016  ? Sleep apnea   ? uses cpap  ? Sleep apnea with use of continuous positive airway pressure (CPAP) 02/15/2021  ? Sore throat 05/11/2018  ? Unspecified open wound, left lower leg, initial encounter 08/04/2018  ? Vertigo   ? Vitamin D deficiency   ? ? ?Objective:  ?Physical Exam: ?Vascular: DP/PT pulses 2/4 bilateral. CFT <3 seconds. Normal hair growth on digits. No edema.  ?Skin. No lacerations or abrasions bilateral feet.  ?Musculoskeletal: MMT 5/5 bilateral lower extremities in DF, PF, Inversion and Eversion. Deceased ROM in DF of ankle joint. No pain to palpation or pain with ROM of motion.  ?Neurological: Sensation intact to light touch. Protective sensation intact. Relates most numbness in through lateral three toes.  ? ?Assessment:  ? ?1. Lumbar radiculopathy   ? ? ? ?Plan:  ?Patient was evaluated and treated and all questions answered. ?Discussed neuropathy and radiculopathy and etiology  as well as treatment with patient.  ?Radiographs reviewed and discussed with patient.  ?-Discussed and educated patient on foot care, especially with  ?regards to the vascular, neurological and musculoskeletal systems.  ?-Discussed supportive shoes at all times and checking feet regularly.  ?-Follow-up with PCP for prescription management of gabapentin.  ?-Discussed use of capsaicin cream.  ?-Follow-up with back specialist for evaluation.    ?-Patient to return as needed.  ? ? ?Lorenda Peck, DPM  ? ? ?

## 2021-10-26 ENCOUNTER — Telehealth: Payer: Self-pay | Admitting: *Deleted

## 2021-10-26 NOTE — Telephone Encounter (Signed)
Primary Cardiologist:None ? ?Chart reviewed as part of pre-operative protocol coverage. Because of Rebecca Rojas's past medical history and time since last visit, he/she will require a virtual visit/telephone call in order to better assess preoperative cardiovascular risk. ? ?Pre-op covering staff: ?- Please contact patient, obtain consent, and schedule appointment  ? ?Dr. Dulce Sellar has advised that patient may hold aspirin for 7 days prior to surgery and restart 24 hours after surgery.  ? ?Rebecca Aland, NP-C ? ?  ?10/26/2021, 4:46 PM ?[facility] ?[address], [address] ?Office [phone] Fax [phone] ? ?

## 2021-10-26 NOTE — Telephone Encounter (Signed)
? ?  Pre-operative Risk Assessment  ?  ?Patient Name: Rebecca Rojas  ?DOB: 09-13-1955 ?MRN: [id number]  ? ?  ? ?Request for Surgical Clearance   ? ?Procedure:   LEFT SHOULDER MINI OPEN vs ARTHROSCOPIC ROTATOR CUFF REPAIR, SUBACROMIAL DECOMPRESSION AND DISTAL CLAVICLE EXCISION ? ?Date of Surgery:  Clearance 11/08/21                              ?   ?Surgeon:  DR. Jonny Ruiz LEE GRAVES ?Surgeon's Group or Practice Name:  [facility] ?Phone number:  [phone] ?Fax number:  [phone] ATTN: JUDY DANIELS ?  ?Type of Clearance Requested:   ?- Medical  ?  ?Type of Anesthesia:   CHOICE ?  ?Additional requests/questions:   ? ?Signed, ?Danielle Rankin   ?10/26/2021, 4:14 PM  ? ?

## 2021-10-27 ENCOUNTER — Encounter: Payer: Self-pay | Admitting: Physician Assistant

## 2021-10-27 ENCOUNTER — Telehealth: Payer: Self-pay | Admitting: *Deleted

## 2021-10-27 ENCOUNTER — Other Ambulatory Visit: Payer: Self-pay

## 2021-10-27 ENCOUNTER — Ambulatory Visit (INDEPENDENT_AMBULATORY_CARE_PROVIDER_SITE_OTHER): Payer: Medicare Other | Admitting: Physician Assistant

## 2021-10-27 DIAGNOSIS — Z01810 Encounter for preprocedural cardiovascular examination: Secondary | ICD-10-CM | POA: Diagnosis not present

## 2021-10-27 DIAGNOSIS — I119 Hypertensive heart disease without heart failure: Secondary | ICD-10-CM

## 2021-10-27 DIAGNOSIS — E8881 Metabolic syndrome: Secondary | ICD-10-CM

## 2021-10-27 DIAGNOSIS — Z6841 Body Mass Index (BMI) 40.0 and over, adult: Secondary | ICD-10-CM

## 2021-10-27 DIAGNOSIS — E782 Mixed hyperlipidemia: Secondary | ICD-10-CM

## 2021-10-27 MED ORDER — SEMAGLUTIDE (2 MG/DOSE) 8 MG/3ML ~~LOC~~ SOPN: 2.0000 mg | PEN_INJECTOR | SUBCUTANEOUS | 2 refills | Status: DC

## 2021-10-27 NOTE — Telephone Encounter (Signed)
Pt has been scheduled for telephone visit today, 10/27/21 1:20. ?Medications reconciled / consent on file ? ?  ?Patient Consent for Virtual Visit  ? ? ?   ? ?Rebecca Rojas has provided verbal consent on 10/27/2021 for a virtual visit (video or telephone). ? ? ?CONSENT FOR VIRTUAL VISIT FOR:  Rebecca Rojas  ?By participating in this virtual visit I agree to the following: ? ?I hereby voluntarily request, consent and authorize [facility] and its employed or contracted physicians, physician assistants, nurse practitioners or other licensed health care professionals (the Practitioner), to provide me with telemedicine health care services (the ?Services") as deemed necessary by the treating Practitioner. I acknowledge and consent to receive the Services by the Practitioner via telemedicine. I understand that the telemedicine visit will involve communicating with the Practitioner through live audiovisual communication technology and the disclosure of certain medical information by electronic transmission. I acknowledge that I have been given the opportunity to request an in-person assessment or other available alternative prior to the telemedicine visit and am voluntarily participating in the telemedicine visit. ? ?I understand that I have the right to withhold or withdraw my consent to the use of telemedicine in the course of my care at any time, without affecting my right to future care or treatment, and that the Practitioner or I may terminate the telemedicine visit at any time. I understand that I have the right to inspect all information obtained and/or recorded in the course of the telemedicine visit and may receive copies of available information for a reasonable fee.  I understand that some of the potential risks of receiving the Services via telemedicine include:  ?Delay or interruption in medical evaluation due to technological equipment failure or disruption; ?Information transmitted may not be sufficient  (e.g. poor resolution of images) to allow for appropriate medical decision making by the Practitioner; and/or  ?In rare instances, security protocols could fail, causing a breach of personal health information. ? ?Furthermore, I acknowledge that it is my responsibility to provide information about my medical history, conditions and care that is complete and accurate to the best of my ability. I acknowledge that Practitioner's advice, recommendations, and/or decision may be based on factors not within their control, such as incomplete or inaccurate data provided by me or distortions of diagnostic images or specimens that may result from electronic transmissions. I understand that the practice of medicine is not an exact science and that Practitioner makes no warranties or guarantees regarding treatment outcomes. I acknowledge that a copy of this consent can be made available to me via my patient portal [facility] MyChart), or I can request a printed copy by calling the office of [facility].   ? ?I understand that my insurance will be billed for this visit.  ? ?I have read or had this consent read to me. ?I understand the contents of this consent, which adequately explains the benefits and risks of the Services being provided via telemedicine.  ?I have been provided ample opportunity to ask questions regarding this consent and the Services and have had my questions answered to my satisfaction. ?I give my informed consent for the services to be provided through the use of telemedicine in my medical care ? ? ? ?

## 2021-10-27 NOTE — Telephone Encounter (Signed)
Pt has been scheduled for telephone visit today, 10/27/21 1:20. ?

## 2021-10-27 NOTE — Progress Notes (Signed)
Virtual Visit via Telephone Note   This visit type was conducted due to national recommendations for restrictions regarding the COVID-19 Pandemic (e.g. social distancing) in an effort to limit this patient's exposure and mitigate transmission in our community.  Due to her co-morbid illnesses, this patient is at least at moderate risk for complications without adequate follow up.  This format is felt to be most appropriate for this patient at this time.  The patient did not have access to video technology/had technical difficulties with video requiring transitioning to audio format only (telephone).  All issues noted in this document were discussed and addressed.  No physical exam could be performed with this format.  Please refer to the patient's chart for her  consent to telehealth [facility]. Evaluation Performed:  Preoperative cardiovascular risk assessment  This visit type was conducted due to national recommendations for restrictions regarding the COVID-19 Pandemic (e.g. social distancing).  This format is felt to be most appropriate for this patient at this time.  All issues noted in this document were discussed and addressed.  No physical exam was performed (except for noted visual exam findings with Video Visits).  Please refer to the patient's chart (MyChart message for video visits and phone note for telephone visits) for the patient's consent to telehealth for [facility]. _____________   Date:  10/27/2021   Patient ID:  Rebecca PollackJackie S Rojas, DOB 1956/07/02, MRN [id number] Patient Location:  Home Provider location:   Office  Primary Care Provider:  Janie MorningHeaton, Shannon J, NP Primary Cardiologist:  Will Jorja LoaMartin Camnitz, MD Dr. Dulce SellarMunley removed from care as she was seeing Dr. Elberta Fortisamnitz.  Chief Complaint    66 y.o. y/o adult with a h/o PAF, chronic anticoagulation, RBBB, and SVT who is pending LEFT SHOULDER MINI OPEN vs ARTHROSCOPIC ROTATOR CUFF REPAIR, SUBACROMIAL DECOMPRESSION AND  DISTAL CLAVICLE EXCISION and presents today for telephonic preoperative cardiovascular risk assessment.  Past Medical History    Past Medical History:  Diagnosis Date   Acute laryngopharyngitis 01/13/2020   Acute non-recurrent frontal sinusitis 09/22/2017   Allergic rhinitis 02/15/2021   Allergic rhinitis due to animal (cat) (dog) hair and dander 02/15/2021   Allergic rhinitis due to pollen 02/15/2021   Allergy    dust, environmental, feathers, has epipen, prn   Allergy-induced asthma    Anemia    Annual physical exam 02/15/2021   Arthritis    osteoarthritis   Arthritis of right acromioclavicular joint 06/15/2019   Asthma 12/28/2015   Asthma with exacerbation 04/27/2020   Atrial fibrillation (HCC) 05/20/2020   Atrial fibrillation with RVR (HCC) 05/20/2020   Back pain    Benign essential hypertension 12/28/2015   Bilateral lower extremity edema 08/04/2018   Chronic allergic conjunctivitis 02/15/2021   Encounter for screening colonoscopy 01/08/2017   Encounter for screening mammogram for breast cancer 02/15/2021   Glenoid labral tear, right, initial encounter 06/15/2019   Headache    Healthcare maintenance 01/08/2017   High risk medication use 12/28/2015   History of seasonal allergies    Hordeolum internum of right lower eyelid 02/14/2017   Hypertensive heart disease 06/02/2020   Insulin resistance 10/01/2019   Joint pain    Lower extremity edema    Lower respiratory infection (e.g., bronchitis, pneumonia, pneumonitis, pulmonitis) 05/10/2009   Qualifier: Diagnosis of  By: Shelle Ironlance MD, Maree KrabbeKeith M    Migraine headache 12/28/2015   Mild persistent asthma, uncomplicated 02/15/2021   Mixed hyperlipidemia 12/28/2015   Morbid obesity (HCC)    Myalgia 12/28/2015  Need for shingles vaccine 02/15/2021   Osteoarthritis    Other chest pain 02/15/2021   Other specified menopausal and perimenopausal disorders 02/15/2021   Pain of right eye 02/14/2017   Pain of right foot 08/30/2016   Persistent  cough for 3 weeks or longer 10/11/2017   Pharyngitis 05/23/2018   Rotator cuff tear, right 06/15/2019   Shortness of breath    maybe with exertion   Skin lesion of foot 08/30/2016   Sleep apnea    uses cpap   Sleep apnea with use of continuous positive airway pressure (CPAP) 02/15/2021   Sore throat 05/11/2018   Unspecified open wound, left lower leg, initial encounter 08/04/2018   Vertigo    Vitamin D deficiency    Past Surgical History:  Procedure Laterality Date   ABDOMINAL HYSTERECTOMY     COLONOSCOPY  09/2010   normal   FRACTURE SURGERY     left 5th finger fx with bone graft repair   HEEL SPUR EXCISION     JOINT REPLACEMENT     KNEE ARTHROSCOPY Left    Left Knee lateral release     LIPOMA EXCISION     SHOULDER ARTHROSCOPY WITH ROTATOR CUFF REPAIR AND SUBACROMIAL DECOMPRESSION Right 06/15/2019   Procedure: SHOULDER ARTHROSCOPY WITH ROTATOR CUFF REPAIR AND SUBACROMIAL DECOMPRESSION;  Surgeon: Jodi Geralds, MD;  Location: [facility];  Service: Orthopedics;  Laterality: Right;   SVT ABLATION N/A 12/06/2020   Procedure: SVT ABLATION;  Surgeon: Regan Lemming, MD;  Location: [facility];  Service: Cardiovascular;  Laterality: N/A;   TONSILLECTOMY     TOTAL KNEE ARTHROPLASTY  08/03/2011   Procedure: TOTAL KNEE ARTHROPLASTY;  Surgeon: Harvie Junior;  Location: [facility] OR;  Service: Orthopedics;  Laterality: Right;  COMPUTER ASSISTED TOTAL KNEE REPLACEMENTwith revision tibial component   TUBAL LIGATION      Allergies  Allergies  Allergen Reactions   Sulfa Antibiotics Itching   Cefdinir Itching   Erythromycin    Hydrocodone Bit-Homatrop Mbr Rash    History of Present Illness    Rebecca Rojas is a 66 y.o. adult who presents via audio/video conferencing for a telehealth visit today.  Pt was last seen in cardiology clinic on 07/31/21, by Dr. Dulce Sellar.  At that time [name] was doing well.  She is now pending LEFT SHOULDER MINI OPEN vs ARTHROSCOPIC ROTATOR CUFF REPAIR,  SUBACROMIAL DECOMPRESSION AND DISTAL CLAVICLE EXCISION.  Since his last visit, she has continued to remain active. She has been cutting and stacking firewood with her husband. CT coronary 2021 with minimal CAD. She has no cardiac complaints.   Home Medications    Prior to Admission medications   Medication Sig Start Date End Date Taking? Authorizing Provider  acetaminophen (TYLENOL) 500 MG tablet Take 1,000 mg by mouth every 6 (six) hours as needed for moderate pain.    [provider]  albuterol (PROVENTIL HFA;VENTOLIN HFA) 108 (90 BASE) MCG/ACT inhaler Inhale 1-2 puffs into the lungs every 6 (six) hours as needed for wheezing or shortness of breath.     [provider]  azelastine (OPTIVAR) 0.05 % ophthalmic solution Place 1 drop into both eyes 2 (two) times daily as needed for allergies (allergies). 05/16/20   [provider]  EPINEPHrine 0.3 mg/0.3 mL IJ SOAJ injection Inject 0.3 mg into the muscle as needed for anaphylaxis.    [provider]  fluticasone furoate-vilanterol (BREO ELLIPTA) 100-25 MCG/INH AEPB Inhale 1 puff into the lungs daily.  [provider]  ipratropium (ATROVENT) 0.02 % nebulizer solution Take 3 mLs by nebulization every 6 (six) hours as needed for wheezing or shortness of breath.    [provider]  levocetirizine (XYZAL) 5 MG tablet Take 5 mg by mouth every evening.    [provider]  Magnesium 500 MG CAPS Take 500 mg by mouth daily. Patient not taking: Reported on 10/27/2021    [provider]  metoprolol succinate (TOPROL-XL) 25 MG 24 hr tablet TAKE 1 TABLET (25 MG TOTAL) BY MOUTH DAILY. 09/13/21   Baldo Daub, MD  montelukast (SINGULAIR) 10 MG tablet Take 10 mg by mouth at bedtime.    [provider]  Semaglutide, 2 MG/DOSE, 8 MG/3ML SOPN Inject 2 mg as directed once a week. 10/27/21   Janie Morning, NP  Vitamin D, Ergocalciferol, (DRISDOL) 1.25 MG (50000 UNIT) CAPS capsule Take 1  capsule (50,000 Units total) by mouth every 7 (seven) days. Patient not taking: Reported on 10/27/2021 02/15/21   Marianne Sofia, PA-C    Physical Exam    Vital Signs:  CEILI BOSHERS does not have vital signs available for review today.  Given telephonic nature of communication, physical exam is limited. AAOx3. NAD. Normal affect.  Speech and respirations are unlabored.  Accessory Clinical Findings    None  Assessment & Plan    1.  Preoperative Cardiovascular Risk Assessment:  She is able to complete more than 4.0 METS. She does not have a history of ischemic heart disease or heart failure. According to the RCRI, she is at 0.4% risk of MACE during the perioperative period.   Therefore, based on ACC/AHA guidelines, the patient would be at acceptable risk for the planned procedure without further cardiovascular testing.   The patient was advised that if she develops new symptoms prior to surgery to contact our office to arrange for a follow-up visit, and she verbalized understanding.  I will route this recommendation to the requesting party via Epic fax function and remove from pre-op pool. Please call with questions.   Per Dr. Dulce Sellar, she may hold ASA as requested for surgery and resume after when safe per surgeon.    COVID-19 Education: The signs and symptoms of COVID-19 were discussed with the patient and how to seek care for testing (follow up with PCP or arrange E-visit).  The importance of social distancing was discussed today.  Patient Risk:   After full review of this patient's history and clinical status, I feel that he is at least moderate risk for cardiac complications at this time, thus necessitating a telehealth visit sooner than our first available in office visit.  Time:   Today, I have spent 8 minutes with the patient with telehealth technology discussing medical history, symptoms, and management plan.     Roe Rutherford Hoyt Leanos, PA  10/27/2021, 1:27 PM

## 2021-10-30 ENCOUNTER — Telehealth: Payer: Self-pay | Admitting: Pulmonary Disease

## 2021-10-30 NOTE — Telephone Encounter (Signed)
Fax received from Dr. Milly Jakob with [facility] to perform a Left shoulder mini open versus arthroscopic rotator cuff repair on patient.  Patient needs surgery clearance. Patient was seen on 11/9/202. Patient has been scheduled for Thursday with Katie. Office protocol is a risk assessment can be sent to surgeon if patient has been seen in 60 days or less.  ? ?Sending to [facility] for risk assessment or recommendations if patient needs to be seen in office prior to surgical procedure.   ?

## 2021-11-02 ENCOUNTER — Other Ambulatory Visit: Payer: Self-pay

## 2021-11-02 ENCOUNTER — Encounter: Payer: Self-pay | Admitting: Nurse Practitioner

## 2021-11-02 ENCOUNTER — Ambulatory Visit (INDEPENDENT_AMBULATORY_CARE_PROVIDER_SITE_OTHER): Payer: Medicare Other | Admitting: Nurse Practitioner

## 2021-11-02 ENCOUNTER — Ambulatory Visit (INDEPENDENT_AMBULATORY_CARE_PROVIDER_SITE_OTHER): Payer: Medicare Other

## 2021-11-02 VITALS — BP 122/70 | HR 87 | Wt 261.4 lb

## 2021-11-02 DIAGNOSIS — G4733 Obstructive sleep apnea (adult) (pediatric): Secondary | ICD-10-CM

## 2021-11-02 DIAGNOSIS — G4730 Sleep apnea, unspecified: Secondary | ICD-10-CM | POA: Diagnosis not present

## 2021-11-02 DIAGNOSIS — J4530 Mild persistent asthma, uncomplicated: Secondary | ICD-10-CM | POA: Diagnosis not present

## 2021-11-02 DIAGNOSIS — Z01818 Encounter for other preprocedural examination: Secondary | ICD-10-CM | POA: Insufficient documentation

## 2021-11-02 NOTE — Assessment & Plan Note (Signed)
Compliant with use and continues to receive good benefit.  Continue CPAP 9 cm of water nightly.  ? ?Patient Instructions  ?Continue Albuterol inhaler 2 puffs every 6 hours as needed for shortness of breath or wheezing. Notify if symptoms persist despite rescue inhaler/neb use. ?Continue Breo 1 puff daily. Brush tongue and rinse mouth afterwards ?-Continue singulair 10 mg At bedtime ?-Continue Xyzal 5 mg daily ? ?Continue to use CPAP every night, minimum of 6 hours a night.  ?Change equipment every 30 days or as directed by DME. Wash your tubing with warm soap and water daily, hang to dry. Wash humidifier portion weekly.  ?Maintain clean equipment, as directed by home health agency.  ?Be aware of reduced alertness and do not drive or operate heavy machinery if experiencing this or drowsiness.  ?Exercise encouraged, as tolerated. ?Healthy weight management discussed.  ?Avoid or decrease alcohol consumption and medications that make you more sleepy, if possible. ?Notify if persistent daytime sleepiness occurs even with consistent use of CPAP. ? ?Return SD card for surgical clearance ? ?Bring CPAP mask and tubing with you to your procedure.  ? ?Follow up in one year with Dr. Craige Cotta. If symptoms do not improve or worsen, please contact office for sooner follow up or seek emergency care. ? ? ?

## 2021-11-02 NOTE — Assessment & Plan Note (Signed)
Well-controlled sleep apnea.  Appears that asthma is also well controlled.  Moderate risk for surgery/anesthesia. ? ?Factors that increase the risk for postoperative pulmonary complications are age, morbid obesity, sleep apnea and asthma ? ?Respiratory complications generally occur in 1% of ASA Class I patients, 5% of ASA Class II and 10% of ASA Class III-IV patients These complications rarely result in mortality and include postoperative pneumonia, atelectasis, pulmonary embolism, ARDS and increased time requiring postoperative mechanical ventilation. ?  ?Overall, I recommend proceeding with the surgery if the risk for respiratory complications are outweighed by the potential benefits. This will need to be discussed between the patient and surgeon. ?  ?To reduce risks of respiratory complications, I recommend: ?--Pre- and post-operative incentive spirometry performed frequently while awake ?--Inpatient use of currently prescribed positive-pressure for OSA whenever the patient is sleeping ?-- Short duration of surgery as much as possible and avoid paralytic if possible ?--OOB, encourage mobility post-op, DVT prophylaxis  ? ? ?1) RISK FOR PROLONGED MECHANICAL VENTILAION - > 48h ? ?1A) Arozullah - Prolonged mech ventilation risk ?Arozullah Postperative Pulmonary Risk Score - for mech ventilation dependence [address] [employer], [place], major non-cardiac surgery) Comment Score  ?Type of surgery - abd ao aneurysm (27), thoracic (21), neurosurgery / upper abdominal / vascular (21), neck (11) Left shoulder 0  ?Emergency Surgery - (11)  0  ?ALbumin < 3 or poor nutritional state - (9)  0  ?BUN > 30 -  (8)  0  ?Partial or completely dependent functional status - (7)  0  ?COPD -  (6) asthma 6  ?Age - 84 to 68 (4), > 70  (6) 65 4  ?TOTAL  10  ?Risk Stratifcation scores  - < 10 (0.5%), 11-19 (1.8%), 20-27 (4.2%), 28-40 (10.1%), >40 (26.6%)    ? ?

## 2021-11-02 NOTE — Assessment & Plan Note (Signed)
Well-controlled on ICS/LABA therapy and Singulair.  No recent flares.  Follow-up with Dr. Converse Callas as scheduled.  Advised to take inhalers with her to surgery. ?

## 2021-11-02 NOTE — Progress Notes (Signed)
Reviewed and agree with assessment/plan. ? ? ?Coralyn Helling, MD ?[facility] Pulmonary/Critical Care ?11/02/2021, 4:46 PM ?Pager:  [phone] ? ?

## 2021-11-02 NOTE — Patient Instructions (Addendum)
Continue Albuterol inhaler 2 puffs every 6 hours as needed for shortness of breath or wheezing. Notify if symptoms persist despite rescue inhaler/neb use. ?Continue Breo 1 puff daily. Brush tongue and rinse mouth afterwards ?-Continue singulair 10 mg At bedtime ?-Continue Xyzal 5 mg daily ? ?Continue to use CPAP every night, minimum of 6 hours a night.  ?Change equipment every 30 days or as directed by DME. Wash your tubing with warm soap and water daily, hang to dry. Wash humidifier portion weekly.  ?Maintain clean equipment, as directed by home health agency.  ?Be aware of reduced alertness and do not drive or operate heavy machinery if experiencing this or drowsiness.  ?Exercise encouraged, as tolerated. ?Healthy weight management discussed.  ?Avoid or decrease alcohol consumption and medications that make you more sleepy, if possible. ?Notify if persistent daytime sleepiness occurs even with consistent use of CPAP. ? ?Return SD card for surgical clearance ? ?Bring CPAP mask and tubing with you to your procedure.  ? ?Follow up in one year with Dr. Halford Chessman. If symptoms do not improve or worsen, please contact office for sooner follow up or seek emergency care. ?

## 2021-11-02 NOTE — Progress Notes (Signed)
@Patient  ID: Rebecca Rojas, adult    DOB: 1955-10-14, 66 y.o.   MRN: [id number]  No chief complaint on file.   Referring provider: Janie Morning, NP  HPI: 66 year old female, never smoker followed for obstructive sleep apnea on CPAP.  She is a patient Dr. Evlyn Courier and last seen in office on 06/29/2019.  Past medical history significant for asthma follwoed by Dr. [place] Callas, hypertension, migraines, A-fib not on anticoagulation therapy, allergic rhinitis, osteoarthritis, morbid obesity, vitamin D deficiency, HLD, vertigo.  TEST/EVENTS:  07/28/2015 sleep study: AHI 15.2, SPO2 nadir 79%  06/29/2019: OV with Dr. Craige Cotta.  Compliant with CPAP therapy.  No issues with mask fit.  Had shoulder surgery a few weeks ago and was feeling well. Continued CPAP 9 cmH2O.  11/02/2021: Today - surgical clearance Patient presents today for surgical clearance prior to left shoulder surgery with Dr. Delorise Shiner.  She is anticipated to have surgery next week.  Plans for outpatient procedure.  Unsure of anesthesia but last surgery she had with him was a regional block.  She has been doing well overall with her CPAP.  She has had some recent periods where she has not used it for a few nights.  First being due to a sore on her nose which is resolved and the second due to the fact that her 45 year old mother is staying with her.  She did start back on it over the past few days.  Otherwise she has had excellent compliance with her machine.  Denies any excessive daytime fatigue symptoms or drowsy driving.  She does not have any history of narcolepsy and has no problems with morning headaches.  Reports that her breathing is stable and she has not had a flare in over a year if not longer.  She is followed by Dr. [place] Callas for asthma and is on Breo.  Rarely requires use of albuterol.  CPAP download: 9 cmH2O 90% compliance, average usage 6 hours 57 minutes Median unintentional leak 4 L/min Average AHI 0.7  Allergies  Allergen Reactions    Sulfa Antibiotics Itching   Cefdinir Itching   Erythromycin    Hydrocodone Bit-[place] Mbr Rash    Immunization History  Administered Date(s) Administered   Influenza Inj Mdck Quad Pf 04/20/2019, 05/23/2020   Influenza,inj,Quad PF,6+ Mos 05/24/2010, 04/18/2018   Influenza,inj,quad, With Preservative 05/23/2020   Influenza-Unspecified 08/29/2016, 04/18/2018, 09/05/2021   PFIZER(Purple Top)SARS-COV-2 Vaccination 11/07/2019, 11/27/2019, 12/28/2019, 03/20/2021   Pfizer Covid-19 Vaccine Bivalent Booster 29yrs & up 09/05/2021   Pneumococcal Conjugate-13 12/29/2015   Pneumococcal Polysaccharide-23 06/24/2009   Tdap 10/01/2012   Zoster Recombinat (Shingrix) 02/15/2021, 04/25/2021    Past Medical History:  Diagnosis Date   Acute laryngopharyngitis 01/13/2020   Acute non-recurrent frontal sinusitis 09/22/2017   Allergic rhinitis 02/15/2021   Allergic rhinitis due to animal (cat) (dog) hair and dander 02/15/2021   Allergic rhinitis due to pollen 02/15/2021   Allergy    dust, environmental, feathers, has epipen, prn   Allergy-induced asthma    Anemia    Annual physical exam 02/15/2021   Arthritis    osteoarthritis   Arthritis of right acromioclavicular joint 06/15/2019   Asthma 12/28/2015   Asthma with exacerbation 04/27/2020   Atrial fibrillation (HCC) 05/20/2020   Atrial fibrillation with RVR (HCC) 05/20/2020   Back pain    Benign essential hypertension 12/28/2015   Bilateral lower extremity edema 08/04/2018   Chronic allergic conjunctivitis 02/15/2021   Encounter for screening colonoscopy 01/08/2017   Encounter for screening mammogram for breast cancer  02/15/2021   Glenoid labral tear, right, initial encounter 06/15/2019   Headache    Healthcare maintenance 01/08/2017   High risk medication use 12/28/2015   History of seasonal allergies    Hordeolum internum of right lower eyelid 02/14/2017   Hypertensive heart disease 06/02/2020   Insulin resistance 10/01/2019   Joint pain     Lower extremity edema    Lower respiratory infection (e.g., bronchitis, pneumonia, pneumonitis, pulmonitis) 05/10/2009   Qualifier: Diagnosis of  By: Shelle Iron MD, Maree Krabbe    Migraine headache 12/28/2015   Mild persistent asthma, uncomplicated 02/15/2021   Mixed hyperlipidemia 12/28/2015   Morbid obesity (HCC)    Myalgia 12/28/2015   Need for shingles vaccine 02/15/2021   Osteoarthritis    Other chest pain 02/15/2021   Other specified menopausal and perimenopausal disorders 02/15/2021   Pain of right eye 02/14/2017   Pain of right foot 08/30/2016   Persistent cough for 3 weeks or longer 10/11/2017   Pharyngitis 05/23/2018   Rotator cuff tear, right 06/15/2019   Shortness of breath    maybe with exertion   Skin lesion of foot 08/30/2016   Sleep apnea    uses cpap   Sleep apnea with use of continuous positive airway pressure (CPAP) 02/15/2021   Sore throat 05/11/2018   Unspecified open wound, left lower leg, initial encounter 08/04/2018   Vertigo    Vitamin D deficiency     Tobacco History: Social History   Tobacco Use  Smoking Status Never   Passive exposure: Never  Smokeless Tobacco Never   Counseling given: Not Answered   Outpatient Medications Prior to Visit  Medication Sig Dispense Refill   acetaminophen (TYLENOL) 500 MG tablet Take 1,000 mg by mouth every 6 (six) hours as needed for moderate pain.     albuterol (PROVENTIL HFA;VENTOLIN HFA) 108 (90 BASE) MCG/ACT inhaler Inhale 1-2 puffs into the lungs every 6 (six) hours as needed for wheezing or shortness of breath.      azelastine (OPTIVAR) 0.05 % ophthalmic solution Place 1 drop into both eyes 2 (two) times daily as needed for allergies (allergies).     EPINEPHrine 0.3 mg/0.3 mL IJ SOAJ injection Inject 0.3 mg into the muscle as needed for anaphylaxis.     fluticasone furoate-vilanterol (BREO ELLIPTA) 100-25 MCG/INH AEPB Inhale 1 puff into the lungs daily.     ipratropium (ATROVENT) 0.02 % nebulizer solution Take 3 mLs  by nebulization every 6 (six) hours as needed for wheezing or shortness of breath.     levocetirizine (XYZAL) 5 MG tablet Take 5 mg by mouth every evening.     Magnesium 500 MG CAPS Take 500 mg by mouth daily. (Patient not taking: Reported on 10/27/2021)     metoprolol succinate (TOPROL-XL) 25 MG 24 hr tablet TAKE 1 TABLET (25 MG TOTAL) BY MOUTH DAILY. 90 tablet 3   montelukast (SINGULAIR) 10 MG tablet Take 10 mg by mouth at bedtime.     Semaglutide, 2 MG/DOSE, 8 MG/3ML SOPN Inject 2 mg as directed once a week. 3 mL 2   Vitamin D, Ergocalciferol, (DRISDOL) 1.25 MG (50000 UNIT) CAPS capsule Take 1 capsule (50,000 Units total) by mouth every 7 (seven) days. (Patient not taking: Reported on 10/27/2021) 12 capsule 3   No facility-administered medications prior to visit.     Review of Systems:   Constitutional: No weight loss or gain, night sweats, fevers, chills, fatigue, or lassitude. HEENT: No headaches, difficulty swallowing, tooth/dental problems, or sore throat. No sneezing, itching,  ear ache, nasal congestion, or post nasal drip CV:  No chest pain, orthopnea, PND, swelling in lower extremities, anasarca, dizziness, palpitations, syncope Resp: No shortness of breath with exertion or at rest. No excess mucus or change in color of mucus. No productive or non-productive. No hemoptysis. No wheezing.  No chest wall deformity Skin: No rash, lesions, ulcerations MSK:  +left shoulder pain. No joint swelling.  No decreased range of motion.  No back pain. Neuro: No dizziness or lightheadedness.  Psych: No depression or anxiety. Mood stable.     Physical Exam:  BP 122/70   Pulse 87   Wt 261 lb 6.4 oz (118.6 kg)   SpO2 98%   BMI 49.39 kg/m   GEN: Pleasant, interactive, well-appearing; morbidly obese; in no acute distress. HEENT:  Normocephalic and atraumatic. PERRLA. Sclera white. Nasal turbinates pink, moist and patent bilaterally. No rhinorrhea present. Oropharynx pink and moist, without  exudate or edema. No lesions, ulcerations, or postnasal drip.  NECK:  Supple w/ fair ROM. No JVD present. Normal carotid impulses w/o bruits. Thyroid symmetrical with no goiter or nodules palpated. No lymphadenopathy.   CV: RRR, no m/r/g, no peripheral edema. Pulses intact, +2 bilaterally. No cyanosis, pallor or clubbing. PULMONARY:  Unlabored, regular breathing. Clear bilaterally A&P w/o wheezes/rales/rhonchi. No accessory muscle use. No dullness to percussion. GI: BS present and normoactive. Soft, non-tender to palpation.  Neuro: A/Ox3. No focal deficits noted.   Skin: Warm, no lesions or rashe Psych: Normal affect and behavior. Judgement and thought content appropriate.     Lab Results:  CBC    Component Value Date/Time   WBC 8.9 09/11/2021 0833   WBC 9.4 06/12/2019 1531   RBC 4.79 09/11/2021 0833   RBC 4.68 06/12/2019 1531   HGB 14.0 09/11/2021 0833   HCT 42.6 09/11/2021 0833   PLT 322 09/11/2021 0833   MCV 89 09/11/2021 0833   MCH 29.2 09/11/2021 0833   MCH 29.5 06/12/2019 1531   MCHC 32.9 09/11/2021 0833   MCHC 31.4 06/12/2019 1531   RDW 13.1 09/11/2021 0833   LYMPHSABS 3.4 (H) 09/11/2021 0833   MONOABS 0.7 02/11/2013 1810   EOSABS 0.2 09/11/2021 0833   BASOSABS 0.1 09/11/2021 0833    BMET    Component Value Date/Time   NA 142 09/11/2021 0833   K 5.1 09/11/2021 0833   CL 105 09/11/2021 0833   CO2 26 09/11/2021 0833   GLUCOSE 89 09/11/2021 0833   GLUCOSE 92 02/11/2013 1810   BUN 14 09/11/2021 0833   CREATININE 0.77 09/11/2021 0833   CALCIUM 9.1 09/11/2021 0833   GFRNONAA 93 06/13/2020 0936   GFRAA 107 06/13/2020 0936    BNP No results found for: BNP   Imaging:  No results found.    No flowsheet data found.  No results found for: NITRICOXIDE      Assessment & Plan:   Sleep apnea with use of continuous positive airway pressure (CPAP) Compliant with use and continues to receive good benefit.  Continue CPAP 9 cm of water nightly.   Patient  Instructions  Continue Albuterol inhaler 2 puffs every 6 hours as needed for shortness of breath or wheezing. Notify if symptoms persist despite rescue inhaler/neb use. Continue Breo 1 puff daily. Brush tongue and rinse mouth afterwards -Continue singulair 10 mg At bedtime -Continue Xyzal 5 mg daily  Continue to use CPAP every night, minimum of 6 hours a night.  Change equipment every 30 days or as directed by DME. Wash your tubing  with warm soap and water daily, hang to dry. Wash humidifier portion weekly.  Maintain clean equipment, as directed by home health agency.  Be aware of reduced alertness and do not drive or operate heavy machinery if experiencing this or drowsiness.  Exercise encouraged, as tolerated. Healthy weight management discussed.  Avoid or decrease alcohol consumption and medications that make you more sleepy, if possible. Notify if persistent daytime sleepiness occurs even with consistent use of CPAP.  Return SD card for surgical clearance  Bring CPAP mask and tubing with you to your procedure.   Follow up in one year with Dr. Craige Cotta. If symptoms do not improve or worsen, please contact office for sooner follow up or seek emergency care.    Mild persistent asthma, uncomplicated Well-controlled on ICS/LABA therapy and Singulair.  No recent flares.  Follow-up with Dr. [place] Callas as scheduled.  Advised to take inhalers with her to surgery.  Preoperative clearance Well-controlled sleep apnea.  Appears that asthma is also well controlled.  Moderate risk for surgery/anesthesia.  Factors that increase the risk for postoperative pulmonary complications are age, morbid obesity, sleep apnea and asthma  Respiratory complications generally occur in 1% of ASA Class I patients, 5% of ASA Class II and 10% of ASA Class III-IV patients These complications rarely result in mortality and include postoperative pneumonia, atelectasis, pulmonary embolism, ARDS and increased time requiring  postoperative mechanical ventilation.   Overall, I recommend proceeding with the surgery if the risk for respiratory complications are outweighed by the potential benefits. This will need to be discussed between the patient and surgeon.   To reduce risks of respiratory complications, I recommend: --Pre- and post-operative incentive spirometry performed frequently while awake --Inpatient use of currently prescribed positive-pressure for OSA whenever the patient is sleeping -- Short duration of surgery as much as possible and avoid paralytic if possible --OOB, encourage mobility post-op, DVT prophylaxis    1) RISK FOR PROLONGED MECHANICAL VENTILAION - > 48h  1A) [place] - Prolonged mech ventilation risk [place] Postperative Pulmonary Risk Score - for mech ventilation dependence >48h USAA, Ann Surg 2000, major non-cardiac surgery) Comment Score  Type of surgery - abd ao aneurysm (27), thoracic (21), neurosurgery / upper abdominal / vascular (21), neck (11) Left shoulder 0  Emergency Surgery - (11)  0  ALbumin < 3 or poor nutritional state - (9)  0  BUN > 30 -  (8)  0  Partial or completely dependent functional status - (7)  0  COPD -  (6) asthma 6  Age - 60 to 62 (4), > 70  (6) 65 4  TOTAL  10  Risk Stratifcation scores  - < 10 (0.5%), 11-19 (1.8%), 20-27 (4.2%), 28-40 (10.1%), >40 (26.6%)      I spent 30 minutes of dedicated to the care of this patient on the date of this encounter to include pre-visit review of records, face-to-face time with the patient discussing conditions above, post visit ordering of testing, clinical documentation with the electronic health record, making appropriate referrals as documented, and communicating necessary findings to members of the patients care team.  Noemi Chapel, NP 11/02/2021  Pt aware and understands NP's role.

## 2021-11-06 NOTE — Telephone Encounter (Signed)
OV notes and clearance form have been faxed back to [facility]. Nothing further needed at this time.  

## 2021-12-11 ENCOUNTER — Encounter: Payer: Self-pay | Admitting: Nurse Practitioner

## 2021-12-11 ENCOUNTER — Ambulatory Visit (INDEPENDENT_AMBULATORY_CARE_PROVIDER_SITE_OTHER): Payer: Medicare Other | Admitting: Nurse Practitioner

## 2021-12-11 VITALS — BP 132/72 | HR 73 | Resp 18 | Ht 62.0 in | Wt 261.0 lb

## 2021-12-11 DIAGNOSIS — Z6841 Body Mass Index (BMI) 40.0 and over, adult: Secondary | ICD-10-CM | POA: Diagnosis not present

## 2021-12-11 DIAGNOSIS — E66813 Obesity, class 3: Secondary | ICD-10-CM

## 2021-12-11 DIAGNOSIS — Z23 Encounter for immunization: Secondary | ICD-10-CM | POA: Diagnosis not present

## 2021-12-11 NOTE — Progress Notes (Signed)
? ?Subjective:  ?Patient ID: Rebecca Rojas, adult    DOB: 11-28-1955  Age: 66 y.o. MRN: [id number] ? ?CC: ? ?Weight management ? ?HPI ?[name] is a 66 year old female that presents for follow-up of weight management. She is currently prescribed Ozempic 2 mg injection weekly. Current weight 261 lbs, BMI 47.74 she recently underwent shoulder surgery. She has been unable to cook or exercise. Weight has been neutral since last visit 81-month ago. She denies side effects of medication. She is currently undergoing PT for shoulder. She hopes to increase mobility in order to return to cooking and increasing physical activity.  ? ? ?Pertinent labs: ?Lab Results  ?Component Value Date  ? CHOL 169 09/11/2021  ? HDL 42 09/11/2021  ? LDLCALC 105 (H) 09/11/2021  ? TRIG 123 09/11/2021  ? CHOLHDL 4.0 09/11/2021  ? Lab Results  ?Component Value Date  ? NA 142 09/11/2021  ? K 5.1 09/11/2021  ? CREATININE 0.77 09/11/2021  ? EGFR 86 09/11/2021  ? GFRNONAA 93 06/13/2020  ? GLUCOSE 89 09/11/2021  ?  ? ?The 10-year ASCVD risk score (Arnett DK, et al., 2019) is: 7%  ? ?  ?Current Outpatient Medications on File Prior to Visit  ?Medication Sig Dispense Refill  ? acetaminophen (TYLENOL) 500 MG tablet Take 1,000 mg by mouth every 6 (six) hours as needed for moderate pain.    ? albuterol (PROVENTIL HFA;VENTOLIN HFA) 108 (90 BASE) MCG/ACT inhaler Inhale 1-2 puffs into the lungs every 6 (six) hours as needed for wheezing or shortness of breath.     ? azelastine (OPTIVAR) 0.05 % ophthalmic solution Place 1 drop into both eyes 2 (two) times daily as needed for allergies (allergies).    ? EPINEPHrine 0.3 mg/0.3 mL IJ SOAJ injection Inject 0.3 mg into the muscle as needed for anaphylaxis.    ? fluticasone furoate-vilanterol (BREO ELLIPTA) 100-25 MCG/INH AEPB Inhale 1 puff into the lungs daily.    ? ipratropium (ATROVENT) 0.02 % nebulizer solution Take 3 mLs by nebulization every 6 (six) hours as needed for wheezing or shortness of breath.    ?  levocetirizine (XYZAL) 5 MG tablet Take 5 mg by mouth every evening.    ? Magnesium 500 MG CAPS Take 500 mg by mouth daily.    ? metoprolol succinate (TOPROL-XL) 25 MG 24 hr tablet TAKE 1 TABLET (25 MG TOTAL) BY MOUTH DAILY. 90 tablet 3  ? montelukast (SINGULAIR) 10 MG tablet Take 10 mg by mouth at bedtime.    ? Semaglutide, 2 MG/DOSE, 8 MG/3ML SOPN Inject 2 mg as directed once a week. 3 mL 2  ? Vitamin D, Ergocalciferol, (DRISDOL) 1.25 MG (50000 UNIT) CAPS capsule Take 1 capsule (50,000 Units total) by mouth every 7 (seven) days. 12 capsule 3  ? ?No current facility-administered medications on file prior to visit.  ? ?Past Medical History:  ?Diagnosis Date  ? Acute laryngopharyngitis 01/13/2020  ? Acute non-recurrent frontal sinusitis 09/22/2017  ? Allergic rhinitis 02/15/2021  ? Allergic rhinitis due to animal (cat) (dog) hair and dander 02/15/2021  ? Allergic rhinitis due to pollen 02/15/2021  ? Allergy   ? dust, environmental, feathers, has epipen, prn  ? Allergy-induced asthma   ? Anemia   ? Annual physical exam 02/15/2021  ? Arthritis   ? osteoarthritis  ? Arthritis of right acromioclavicular joint 06/15/2019  ? Asthma 12/28/2015  ? Asthma with exacerbation 04/27/2020  ? Atrial fibrillation ([place] 05/20/2020  ? Atrial fibrillation with RVR ([facility] 05/20/2020  ? Back pain   ?  Benign essential hypertension 12/28/2015  ? Bilateral lower extremity edema 08/04/2018  ? Chronic allergic conjunctivitis 02/15/2021  ? Encounter for screening colonoscopy 01/08/2017  ? Encounter for screening mammogram for breast cancer 02/15/2021  ? Glenoid labral tear, right, initial encounter 06/15/2019  ? Headache   ? Healthcare maintenance 01/08/2017  ? High risk medication use 12/28/2015  ? History of seasonal allergies   ? Hordeolum internum of right lower eyelid 02/14/2017  ? Hypertensive heart disease 06/02/2020  ? Insulin resistance 10/01/2019  ? Joint pain   ? Lower extremity edema   ? Lower respiratory infection (e.g., bronchitis,  pneumonia, pneumonitis, pulmonitis) 05/10/2009  ? Qualifier: Diagnosis of  By: Gwenette Greet MD, Armando Reichert   ? Migraine headache 12/28/2015  ? Mild persistent asthma, uncomplicated 10/18/6008  ? Mixed hyperlipidemia 12/28/2015  ? Morbid obesity ([place])   ? Myalgia 12/28/2015  ? Need for shingles vaccine 02/15/2021  ? Osteoarthritis   ? Other chest pain 02/15/2021  ? Other specified menopausal and perimenopausal disorders 02/15/2021  ? Pain of right eye 02/14/2017  ? Pain of right foot 08/30/2016  ? Persistent cough for 3 weeks or longer 10/11/2017  ? Pharyngitis 05/23/2018  ? Rotator cuff tear, right 06/15/2019  ? Shortness of breath   ? maybe with exertion  ? Skin lesion of foot 08/30/2016  ? Sleep apnea   ? uses cpap  ? Sleep apnea with use of continuous positive airway pressure (CPAP) 02/15/2021  ? Sore throat 05/11/2018  ? Unspecified open wound, left lower leg, initial encounter 08/04/2018  ? Vertigo   ? Vitamin D deficiency   ? ?Past Surgical History:  ?Procedure Laterality Date  ? ABDOMINAL HYSTERECTOMY    ? COLONOSCOPY  09/2010  ? normal  ? FRACTURE SURGERY    ? left 5th finger fx with bone graft repair  ? HEEL SPUR EXCISION    ? JOINT REPLACEMENT    ? KNEE ARTHROSCOPY Left   ? Left Knee lateral release    ? LIPOMA EXCISION    ? SHOULDER ARTHROSCOPY WITH ROTATOR CUFF REPAIR AND SUBACROMIAL DECOMPRESSION Right 06/15/2019  ? Procedure: SHOULDER ARTHROSCOPY WITH ROTATOR CUFF REPAIR AND SUBACROMIAL DECOMPRESSION;  Surgeon: Dorna Leitz, MD;  Location: [facility];  Service: Orthopedics;  Laterality: Right;  ? SVT ABLATION N/A 12/06/2020  ? Procedure: SVT ABLATION;  Surgeon: Constance Haw, MD;  Location: [facility];  Service: Cardiovascular;  Laterality: N/A;  ? TONSILLECTOMY    ? TOTAL KNEE ARTHROPLASTY  08/03/2011  ? Procedure: TOTAL KNEE ARTHROPLASTY;  Surgeon: Alta Corning;  Location: [facility];  Service: Orthopedics;  Laterality: Right;  COMPUTER ASSISTED TOTAL KNEE REPLACEMENTwith revision tibial component  ? TUBAL  LIGATION    ?  ?Family History  ?Problem Relation Age of Onset  ? Hypertension Mother   ? Heart disease Mother   ? Heart attack Mother   ? Hyperlipidemia Mother   ? Stroke Mother   ? Obesity Mother   ? Cancer Father   ? Liver disease Father   ? Obesity Father   ? Anesthesia problems Neg Hx   ? Hypotension Neg Hx   ? Malignant hyperthermia Neg Hx   ? Pseudochol deficiency Neg Hx   ? Colon cancer Neg Hx   ? Colon polyps Neg Hx   ? Esophageal cancer Neg Hx   ? Rectal cancer Neg Hx   ? Stomach cancer Neg Hx   ? ?Social History  ? ?Socioeconomic History  ? Marital status: Married  ?  Spouse name: Kishia Shackett  ? Number of children: 2  ? Years of education: Not on file  ? Highest education level: Not on file  ?Occupational History  ? 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  ?Tobacco Use  ? Smoking status: Never  ?  Passive exposure: Never  ? Smokeless tobacco: Never  ?Vaping Use  ? Vaping Use: Never used  ?Substance and Sexual Activity  ? Alcohol use: Not Currently  ? Drug use: Never  ? Sexual activity: Yes  ?Other Topics Concern  ? Not on file  ?Social History Narrative  ? Not on file  ? ?Social Determinants of Health  ? ?Financial Resource Strain: Not on file  ?Food Insecurity: Not on file  ?Transportation Needs: Not on file  ?Physical Activity: Not on file  ?Stress: Not on file  ?Social Connections: Not on file  ? ? ?Review of Systems  ?Constitutional:  Negative for appetite change, fatigue and unexpected weight change.  ?HENT:  Negative for congestion, ear pain, rhinorrhea, sinus pressure, sinus pain and tinnitus.   ?Eyes:  Negative for pain.  ?Respiratory:  Negative for cough and shortness of breath.   ?Cardiovascular:  Negative for chest pain, palpitations and leg swelling.  ?Gastrointestinal:  Negative for abdominal pain, constipation, diarrhea, nausea and vomiting.  ?Endocrine: Negative for cold intolerance, heat intolerance, polydipsia, polyphagia and polyuria.  ?Genitourinary:  Negative for dysuria, frequency  and hematuria.  ?Musculoskeletal:  Positive for arthralgias (left shoulder) and myalgias (left shoulder). Negative for back pain and joint swelling.  ?Skin:  Negative for rash.  ?Allergic/Immunologic: Negative for

## 2021-12-11 NOTE — Patient Instructions (Addendum)
Continue medications ?Pneumonvax-20 vaccine given in office ?Follow-up in 753-months, fasting ? ? ? ?Pneumococcal Conjugate Vaccine: What You Need to Know ?1. Why get vaccinated? ?Pneumococcal conjugate vaccine can prevent pneumococcal disease. ?Pneumococcal disease refers to any illness caused by pneumococcal bacteria. These bacteria can cause many types of illnesses, including pneumonia, which is an infection of the lungs. Pneumococcal bacteria are one of the most common causes of pneumonia. ?Besides pneumonia, pneumococcal bacteria can also cause: ?Ear infections ?Sinus infections ?Meningitis (infection of the tissue covering the brain and spinal cord) ?Bacteremia (infection of the blood) ?Anyone can get pneumococcal disease, but children under 66 years old, people with certain medical conditions or other risk factors, and adults 65 years or older are at the highest risk. ?Most pneumococcal infections are mild. However, some can result in long-term problems, such as brain damage or hearing loss. Meningitis, bacteremia, and pneumonia caused by pneumococcal disease can be fatal. ?2. Pneumococcal conjugate vaccine ?Pneumococcal conjugate vaccine helps protect against bacteria that cause pneumococcal disease. There are three pneumococcal conjugate vaccines (PCV13, PCV15, and PCV20). The different vaccines are recommended for different people based on their age and medical status. ?PCV13 ?Infants and young children usually need 4 doses of PCV13, at ages 552, 414, 286, and 4112-15 months. ?Older children (through age 66 months) may be vaccinated with PCV13 if they did not receive the recommended doses. ?Children and adolescents 226-66 years of age with certain medical conditions should receive a single dose of PCV13 if they did not already receive PCV13. ?PCV15 or PCV20 ?Adults 19 through 66 years old with certain medical conditions or other risk factors who have not already received a pneumococcal conjugate vaccine should receive  either: ?a single dose of PCV15 followed by a dose of pneumococcal polysaccharide vaccine (PPSV23), or ?a single dose of PCV20. ?Adults 65 years or older who have not already received a pneumococcal conjugate vaccine should receive either: ?a single dose of PCV15 followed by a dose of PPSV23, or ?a single dose of PCV20. ?Your health care provider can give you more information. ?3. Talk with your health care provider ?Tell your vaccination provider if the person getting the vaccine: ?Has had an allergic reaction after a previous dose of any type of pneumococcal conjugate vaccine (PCV13, PCV15, PCV20, or an earlier pneumococcal conjugate vaccine known as PCV7), or to any vaccine containing diphtheria toxoid (for example, DTaP), or has any severe, life-threatening allergies ?In some cases, your health care provider may decide to postpone pneumococcal conjugate vaccination until a future visit. ?People with minor illnesses, such as a cold, may be vaccinated. People who are moderately or severely ill should usually wait until they recover. ?Your health care provider can give you more information. ?4. Risks of a vaccine reaction ?Redness, swelling, pain, or tenderness where the shot is given, and fever, loss of appetite, fussiness (irritability), feeling tired, headache, muscle aches, joint pain, and chills can happen after pneumococcal conjugate vaccination. ?Young children may be at increased risk for seizures caused by fever after PCV13 if it is administered at the same time as inactivated influenza vaccine. Ask your health care provider for more information. ?People sometimes faint after medical procedures, including vaccination. Tell your provider if you feel dizzy or have vision changes or ringing in the ears. ?As with any medicine, there is a very remote chance of a vaccine causing a severe allergic reaction, other serious injury, or death. ?5. What if there is a serious problem? ?An allergic reaction could occur  after the vaccinated person leaves the clinic. If you see signs of a severe allergic reaction (hives, swelling of the face and throat, difficulty breathing, a fast heartbeat, dizziness, or weakness), call 9-1-1 and get the person to the nearest hospital. ?For other signs that concern you, call your health care provider. ?Adverse reactions should be reported to the Vaccine Adverse Event Reporting System (VAERS). Your health care provider will usually file this report, or you can do it yourself. Visit the VAERS website at www.vaers.LAgents.no or call (605)619-0508. VAERS is only for reporting reactions, and VAERS staff members do not give medical advice. ?6. The National Vaccine Injury Compensation Program ?The National Vaccine Injury Compensation Program (VICP) is a federal program that was created to compensate people who may have been injured by certain vaccines. Claims regarding alleged injury or death due to vaccination have a time limit for filing, which may be as short as two years. Visit the VICP website at SpiritualWord.at or call (581)504-9914 to learn about the program and about filing a claim. ?7. How can I learn more? ?Ask your health care provider. ?Call your local or state health department. ?Visit the website of the Food and Drug Administration (FDA) for vaccine package inserts and additional information at FinderList.no. ?Contact the Centers for Disease Control and Prevention (CDC): ?Call 628 660 9846 (1-800-CDC-INFO) or ?Visit CDC's website at PicCapture.uy. ?Source: CDC Vaccine Information Statement (Interim) Pneumococcal Conjugate Vaccine (09/23/2020) ?This same material is available at FootballExhibition.com.br for no charge. ?This information is not intended to replace advice given to you by your health care provider. Make sure you discuss any questions you have with your health care provider. ?Document Revised: 07/05/2021 Document Reviewed:  10/07/2020 ?Elsevier Patient Education ? 2023 Elsevier Inc. ? ? ? ?Exercising to Lose Weight ?Getting regular exercise is important for everyone. It is especially important if you are overweight. Being overweight increases your risk of heart disease, stroke, diabetes, high blood pressure, and several types of cancer. Exercising, and reducing the calories you consume, can help you lose weight and improve fitness and health. ?Exercise can be moderate or vigorous intensity. To lose weight, most people need to do a certain amount of moderate or vigorous-intensity exercise each week. ?How can exercise affect me? ?You lose weight when you exercise enough to burn more calories than you eat. Exercise also reduces body fat and builds muscle. The more muscle you have, the more calories you burn. Exercise also: ?Improves mood. ?Reduces stress and tension. ?Improves your overall fitness, flexibility, and endurance. ?Increases bone strength. ?Moderate-intensity exercise ? ?Moderate-intensity exercise is any activity that gets you moving enough to burn at least three times more energy (calories) than if you were sitting. ?Examples of moderate exercise include: ?Walking a mile in 15 minutes. ?Doing light yard work. ?Biking at an easy pace. ?Most people should get at least 150 minutes of moderate-intensity exercise a week to maintain their body weight. ?Vigorous-intensity exercise ?Vigorous-intensity exercise is any activity that gets you moving enough to burn at least six times more calories than if you were sitting. When you exercise at this intensity, you should be working hard enough that you are not able to carry on a conversation. ?Examples of vigorous exercise include: ?Running. ?Playing a team sport, such as football, basketball, and soccer. ?Jumping rope. ?Most people should get at least 75 minutes a week of vigorous exercise to maintain their body weight. ?What actions can I take to lose weight? ?The amount of exercise  you need to lose weight depends  on: ?Your age. ?The type of exercise. ?Any health conditions you have. ?Your overall physical ability. ?Talk to your health care provider about how much exercise you need and what types

## 2022-01-30 ENCOUNTER — Telehealth: Payer: Self-pay

## 2022-01-30 ENCOUNTER — Telehealth: Payer: Self-pay | Admitting: *Deleted

## 2022-01-30 NOTE — Telephone Encounter (Signed)
   Pre-operative Risk Assessment    Patient Name: Rebecca Rojas  DOB: 1956-05-12 MRN: [id number]      Request for Surgical Clearance    Procedure:   Lumbar Mirodiscectomy  Date of Surgery:  Clearance 02/06/22                                 Surgeon:  Dr Duffy Rhody Surgeon's Group or Practice Name:  [facility] Phone number:  [phone]  ext. [phone] Fax number:  [phone]   Type of Clearance Requested:   - Medical    Type of Anesthesia:  General    Additional requests/questions:    Daylene Katayama   01/30/2022, 10:25 AM

## 2022-01-30 NOTE — Telephone Encounter (Signed)
Spoke with patient and scheduled her for a telehealth pre-op clearance appointment for tomorrow 01/31/22 at 11:00 AM.

## 2022-01-30 NOTE — Telephone Encounter (Signed)
  Patient Consent for Virtual Visit        Rebecca Rojas has provided verbal consent on 01/30/2022 for a virtual visit (video or telephone).   CONSENT FOR VIRTUAL VISIT FOR:  Rebecca Rojas  By participating in this virtual visit I agree to the following:  I hereby voluntarily request, consent and authorize [facility] and its employed or contracted physicians, physician assistants, nurse practitioners or other licensed health care professionals (the Practitioner), to provide me with telemedicine health care services (the "Services") as deemed necessary by the treating Practitioner. I acknowledge and consent to receive the Services by the Practitioner via telemedicine. I understand that the telemedicine visit will involve communicating with the Practitioner through live audiovisual communication technology and the disclosure of certain medical information by electronic transmission. I acknowledge that I have been given the opportunity to request an in-person assessment or other available alternative prior to the telemedicine visit and am voluntarily participating in the telemedicine visit.  I understand that I have the right to withhold or withdraw my consent to the use of telemedicine in the course of my care at any time, without affecting my right to future care or treatment, and that the Practitioner or I may terminate the telemedicine visit at any time. I understand that I have the right to inspect all information obtained and/or recorded in the course of the telemedicine visit and may receive copies of available information for a reasonable fee.  I understand that some of the potential risks of receiving the Services via telemedicine include:  Delay or interruption in medical evaluation due to technological equipment failure or disruption; Information transmitted may not be sufficient (e.g. poor resolution of images) to allow for appropriate medical decision making by the Practitioner; and/or   In rare instances, security protocols could fail, causing a breach of personal health information.  Furthermore, I acknowledge that it is my responsibility to provide information about my medical history, conditions and care that is complete and accurate to the best of my ability. I acknowledge that Practitioner's advice, recommendations, and/or decision may be based on factors not within their control, such as incomplete or inaccurate data provided by me or distortions of diagnostic images or specimens that may result from electronic transmissions. I understand that the practice of medicine is not an exact science and that Practitioner makes no warranties or guarantees regarding treatment outcomes. I acknowledge that a copy of this consent can be made available to me via my patient portal [facility] MyChart), or I can request a printed copy by calling the office of [facility].    I understand that my insurance will be billed for this visit.   I have read or had this consent read to me. I understand the contents of this consent, which adequately explains the benefits and risks of the Services being provided via telemedicine.  I have been provided ample opportunity to ask questions regarding this consent and the Services and have had my questions answered to my satisfaction. I give my informed consent for the services to be provided through the use of telemedicine in my medical care

## 2022-01-30 NOTE — Telephone Encounter (Signed)
Primary Cardiologist:Will Jorja Loa, MD  Chart reviewed as part of pre-operative protocol coverage. Because of Rebecca Rojas's past medical history and time since last visit, he/she will require a virtual visit/telephone call in order to better assess preoperative cardiovascular risk.  Pre-op covering staff: - Please contact patient, obtain consent, and schedule appointment   No request to hold antiplatelet or anticoagulant medication.  Levi Aland, NP-C    01/30/2022, 3:16 PM [facility] [address], [address] Office [phone] Fax [phone]

## 2022-01-31 ENCOUNTER — Ambulatory Visit (INDEPENDENT_AMBULATORY_CARE_PROVIDER_SITE_OTHER): Payer: Medicare Other | Admitting: Nurse Practitioner

## 2022-01-31 ENCOUNTER — Encounter: Payer: Self-pay | Admitting: Nurse Practitioner

## 2022-01-31 DIAGNOSIS — Z01810 Encounter for preprocedural cardiovascular examination: Secondary | ICD-10-CM

## 2022-01-31 NOTE — Progress Notes (Signed)
Virtual Visit via Telephone Note   Because of Rebecca Rojas's co-morbid illnesses, she is at least at moderate risk for complications without adequate follow up.  This format is felt to be most appropriate for this patient at this time.  The patient did not have access to video technology/had technical difficulties with video requiring transitioning to audio format only (telephone).  All issues noted in this document were discussed and addressed.  No physical exam could be performed with this format.  Please refer to the patient's chart for her consent to telehealth for [facility].  Evaluation Performed:  Preoperative cardiovascular risk assessment _____________   Date:  01/31/2022   Patient ID:  [name], DOB 11-30-1955, MRN [id number] Patient Location:  Home Provider location:   Office  Primary Care Provider:  Janie Morning, NP Primary Cardiologist:  Regan Lemming, MD  Chief Complaint / Patient Profile   66 y.o. y/o adult with a h/o minimal CAD by coronary CT 2021, SVT, PAF, OSA on CPAP chronic anticoagulation, RBBB who is pending lumbar microdiscectomy and presents today for telephonic preoperative cardiovascular risk assessment.  Past Medical History    Past Medical History:  Diagnosis Date   Acute laryngopharyngitis 01/13/2020   Acute non-recurrent frontal sinusitis 09/22/2017   Allergic rhinitis 02/15/2021   Allergic rhinitis due to animal (cat) (dog) hair and dander 02/15/2021   Allergic rhinitis due to pollen 02/15/2021   Allergy    dust, environmental, feathers, has epipen, prn   Allergy-induced asthma    Anemia    Annual physical exam 02/15/2021   Arthritis    osteoarthritis   Arthritis of right acromioclavicular joint 06/15/2019   Asthma 12/28/2015   Asthma with exacerbation 04/27/2020   Atrial fibrillation (HCC) 05/20/2020   Atrial fibrillation with RVR (HCC) 05/20/2020   Back pain    Benign essential hypertension 12/28/2015   Bilateral  lower extremity edema 08/04/2018   Chronic allergic conjunctivitis 02/15/2021   Encounter for screening colonoscopy 01/08/2017   Encounter for screening mammogram for breast cancer 02/15/2021   Glenoid labral tear, right, initial encounter 06/15/2019   Headache    Healthcare maintenance 01/08/2017   High risk medication use 12/28/2015   History of seasonal allergies    Hordeolum internum of right lower eyelid 02/14/2017   Hypertensive heart disease 06/02/2020   Insulin resistance 10/01/2019   Joint pain    Lower extremity edema    Lower respiratory infection (e.g., bronchitis, pneumonia, pneumonitis, pulmonitis) 05/10/2009   Qualifier: Diagnosis of  By: Shelle Iron MD, Maree Krabbe    Migraine headache 12/28/2015   Mild persistent asthma, uncomplicated 02/15/2021   Mixed hyperlipidemia 12/28/2015   Morbid obesity (HCC)    Myalgia 12/28/2015   Need for shingles vaccine 02/15/2021   Osteoarthritis    Other chest pain 02/15/2021   Other specified menopausal and perimenopausal disorders 02/15/2021   Pain of right eye 02/14/2017   Pain of right foot 08/30/2016   Persistent cough for 3 weeks or longer 10/11/2017   Pharyngitis 05/23/2018   Rotator cuff tear, right 06/15/2019   Shortness of breath    maybe with exertion   Skin lesion of foot 08/30/2016   Sleep apnea    uses cpap   Sleep apnea with use of continuous positive airway pressure (CPAP) 02/15/2021   Sore throat 05/11/2018   Unspecified open wound, left lower leg, initial encounter 08/04/2018   Vertigo    Vitamin D deficiency    Past Surgical History:  Procedure  Laterality Date   ABDOMINAL HYSTERECTOMY     COLONOSCOPY  09/2010   normal   FRACTURE SURGERY     left 5th finger fx with bone graft repair   HEEL SPUR EXCISION     JOINT REPLACEMENT     KNEE ARTHROSCOPY Left    Left Knee lateral release     LIPOMA EXCISION     SHOULDER ARTHROSCOPY WITH ROTATOR CUFF REPAIR AND SUBACROMIAL DECOMPRESSION Right 06/15/2019   Procedure:  SHOULDER ARTHROSCOPY WITH ROTATOR CUFF REPAIR AND SUBACROMIAL DECOMPRESSION;  Surgeon: Jodi GeraldsGraves, John, MD;  Location: [facility];  Service: Orthopedics;  Laterality: Right;   SVT ABLATION N/A 12/06/2020   Procedure: SVT ABLATION;  Surgeon: Regan Lemmingamnitz, Will Martin, MD;  Location: [facility];  Service: Cardiovascular;  Laterality: N/A;   TONSILLECTOMY     TOTAL KNEE ARTHROPLASTY  08/03/2011   Procedure: TOTAL KNEE ARTHROPLASTY;  Surgeon: Harvie JuniorJohn L Graves;  Location: [facility] OR;  Service: Orthopedics;  Laterality: Right;  COMPUTER ASSISTED TOTAL KNEE REPLACEMENTwith revision tibial component   TUBAL LIGATION      Allergies  Allergies  Allergen Reactions   Sulfa Antibiotics Itching   Cefdinir Itching   Erythromycin    Hydrocodone Bit-Homatrop Mbr Rash    History of Present Illness    Rebecca PollackJackie S Hetland is a 66 y.o. adult who presents via audio/video conferencing for a telehealth visit today.  Pt was last seen in cardiology clinic on 07/31/2021 by Dr. Dulce SellarMunley. She had a preoperative cardiac evaluation via telehealth on 10/27/2021 with Bettina GaviaAngie Duke, PA at that time Rebecca Rojas was doing well.  The patient is now pending procedure as outlined above. Since her last visit, she denies chest pain, shortness of breath, lower extremity edema, fatigue, palpitations, melena, hematuria, hemoptysis, diaphoresis, weakness, presyncope, syncope, orthopnea, and PND.  She continues to be active without worsening symptoms. Checks her rhythm at home several times per month.   Home Medications    Prior to Admission medications   Medication Sig Start Date End Date Taking? Authorizing Provider  acetaminophen (TYLENOL) 500 MG tablet Take 1,000 mg by mouth every 6 (six) hours as needed for moderate pain.    [provider]  albuterol (PROVENTIL HFA;VENTOLIN HFA) 108 (90 BASE) MCG/ACT inhaler Inhale 1-2 puffs into the lungs every 6 (six) hours as needed for wheezing or shortness of breath.     [provider]   aspirin EC 81 MG tablet Take 81 mg by mouth daily. Swallow whole.    [provider]  azelastine (OPTIVAR) 0.05 % ophthalmic solution Place 1 drop into both eyes 2 (two) times daily as needed for allergies (allergies). 05/16/20   [provider]  EPINEPHrine 0.3 mg/0.3 mL IJ SOAJ injection Inject 0.3 mg into the muscle as needed for anaphylaxis.    [provider]  fluticasone furoate-vilanterol (BREO ELLIPTA) 100-25 MCG/INH AEPB Inhale 1 puff into the lungs daily.    [provider]  ipratropium (ATROVENT) 0.02 % nebulizer solution Take 3 mLs by nebulization every 6 (six) hours as needed for wheezing or shortness of breath.    [provider]  levocetirizine (XYZAL) 5 MG tablet Take 5 mg by mouth every evening.    [provider]  Magnesium 500 MG CAPS Take 500 mg by mouth daily.    [provider]  metoprolol succinate (TOPROL-XL) 25 MG 24 hr tablet TAKE 1 TABLET (25 MG TOTAL) BY MOUTH DAILY. 09/13/21   Baldo DaubMunley, Brian J, MD  montelukast (SINGULAIR)  10 MG tablet Take 10 mg by mouth at bedtime.    [provider]  Semaglutide, 2 MG/DOSE, 8 MG/3ML SOPN Inject 2 mg as directed once a week. 10/27/21   Janie Morning, NP  Vitamin D, Ergocalciferol, (DRISDOL) 1.25 MG (50000 UNIT) CAPS capsule Take 1 capsule (50,000 Units total) by mouth every 7 (seven) days. 02/15/21   Marianne Sofia, PA-C    Physical Exam    Vital Signs:  NYALA KIRCHNER does not have vital signs available for review today.  Given telephonic nature of communication, physical exam is limited. AAOx3. NAD. Normal affect.  Speech and respirations are unlabored.  Accessory Clinical Findings    Rojas  Assessment & Plan    1.  Preoperative Cardiovascular Risk Assessment: She is doing well from a cardiac perspective and may proceed without further testing. According to the Revised Cardiac Risk Index (RCRI), her Perioperative Risk of Major Cardiac Event is (%): 0.4. Her  Functional Capacity in METs is: 7.59 according to the [facility] Activity Status Index (DASI).  She was asked by [facility] to hold her aspirin for 1 week prior to procedure which she has done.  I have advised her to resume as soon as hemodynamically stable per surgeon.   A copy of this note will be routed to requesting surgeon.  Time:   Today, I have spent 10 minutes with the patient with telehealth technology discussing medical history, symptoms, and management plan.     Levi Aland, NP-C    01/31/2022, 11:05 AM [facility] [address] Office [phone] Fax [phone]

## 2022-03-13 ENCOUNTER — Ambulatory Visit: Payer: Medicare Other | Admitting: Nurse Practitioner

## 2022-03-14 ENCOUNTER — Ambulatory Visit (INDEPENDENT_AMBULATORY_CARE_PROVIDER_SITE_OTHER): Payer: Medicare Other | Admitting: Nurse Practitioner

## 2022-03-14 ENCOUNTER — Other Ambulatory Visit: Payer: Self-pay | Admitting: Nurse Practitioner

## 2022-03-14 ENCOUNTER — Encounter: Payer: Self-pay | Admitting: Nurse Practitioner

## 2022-03-14 VITALS — BP 138/78 | HR 85 | Temp 97.8°F | Ht 62.0 in | Wt 264.0 lb

## 2022-03-14 DIAGNOSIS — Z1231 Encounter for screening mammogram for malignant neoplasm of breast: Secondary | ICD-10-CM

## 2022-03-14 DIAGNOSIS — I10 Essential (primary) hypertension: Secondary | ICD-10-CM | POA: Diagnosis not present

## 2022-03-14 DIAGNOSIS — Z6841 Body Mass Index (BMI) 40.0 and over, adult: Secondary | ICD-10-CM

## 2022-03-14 DIAGNOSIS — E782 Mixed hyperlipidemia: Secondary | ICD-10-CM | POA: Diagnosis not present

## 2022-03-14 DIAGNOSIS — R7301 Impaired fasting glucose: Secondary | ICD-10-CM

## 2022-03-14 DIAGNOSIS — E559 Vitamin D deficiency, unspecified: Secondary | ICD-10-CM | POA: Diagnosis not present

## 2022-03-14 DIAGNOSIS — I119 Hypertensive heart disease without heart failure: Secondary | ICD-10-CM

## 2022-03-14 MED ORDER — CONTRAVE 8-90 MG PO TB12: 1.0000 | ORAL_TABLET | Freq: Every day | ORAL | 0 refills | Status: DC

## 2022-03-14 NOTE — Progress Notes (Signed)
Subjective:  Patient ID: Rebecca Rojas, adult    DOB: [birth year]  Age: 66 y.o. MRN: [id number]  Chief Complaint  Patient presents with   Hypertension   Weight management   Impaired fasting glucose    HPI: Pt returns for follow-up of impaired fasting glucose, HTN, and Vit D deficiency. Pt has been under increased stress due to caring for her aging mother and increased health problems for herself and spouse. She had to stop Wegovy due to financial constraints related to cost. She has requested an alternate weight loss medication.    Hypertension, follow-up: She was last seen for hypertension 6 months ago.  BP at that visit was 132/72. Management includes Metoprolol 25 mg 1 tablet daily. Patient is followed by Cardiology. She reports excellent compliance with treatment. She is not having side effects.  She is following a Regular diet. She is not exercising. She does not smoke.     Use of agents associated with hypertension: NSAIDS.  Outside blood pressures are not being checked  Pertinent labs: Lab Results  Component Value Date   CHOL 169 09/11/2021   HDL 42 09/11/2021   LDLCALC 105 (H) 09/11/2021   TRIG 123 09/11/2021   CHOLHDL 4.0 09/11/2021   Lab Results  Component Value Date   NA 142 09/11/2021   K 5.1 09/11/2021   CREATININE 0.77 09/11/2021   EGFR 86 09/11/2021   GFRNONAA 93 06/13/2020   GLUCOSE 89 09/11/2021     The 10-year ASCVD risk score (Arnett DK, et al., 2019) is: 8.8%    Lipid/Cholesterol, Follow-up  Last lipid panel Other pertinent labs  Lab Results  Component Value Date   CHOL 169 09/11/2021   HDL 42 09/11/2021   LDLCALC 105 (H) 09/11/2021   TRIG 123 09/11/2021   CHOLHDL 4.0 09/11/2021   Lab Results  Component Value Date   ALT 12 09/11/2021   AST 11 09/11/2021   PLT 322 09/11/2021   TSH 2.420 09/11/2021     She was last seen for this 3 months ago.  Management includes diet controlled.     She reports excellent compliance with  treatment.     She is not having side effects.  Current diet: in general, a "healthy" diet   Current exercise: none  The 10-year ASCVD risk score (Arnett DK, et al., 2019) is: 8.8%    Prediabetes, Follow-up  Lab Results  Component Value Date   HGBA1C 5.5 02/13/2021   HGBA1C 5.5 09/01/2019   GLUCOSE 89 09/11/2021   GLUCOSE 105 (H) 02/13/2021   GLUCOSE 83 11/24/2020    Last seen for for this 3 months ago.  Management since that visit includes . Current symptoms include none and have been stable. Prior visit with dietician: no Current diet: in general, a "healthy" diet   Current exercise: none  Pertinent Labs:    Component Value Date/Time   CHOL 169 09/11/2021 0833   TRIG 123 09/11/2021 0833   CHOLHDL 4.0 09/11/2021 0833   CREATININE 0.77 09/11/2021 0833    Wt Readings from Last 3 Encounters:  03/14/22 264 lb (119.7 kg)  12/11/21 261 lb (118.4 kg)  11/02/21 261 lb 6.4 oz (118.6 kg)     Vitamin D deficiency, follow-up  Lab Results  Component Value Date   VD25OH 31.0 09/11/2021   VD25OH 16.0 (L) 02/13/2021   VD25OH 17.8 (L) 09/01/2019   CALCIUM 9.1 09/11/2021   CALCIUM 8.6 (L) 02/13/2021   Wt Readings from Last 3 Encounters:  03/14/22  264 lb (119.7 kg)  12/11/21 261 lb (118.4 kg)  11/02/21 261 lb 6.4 oz (118.6 kg)    She was last seen for vitamin D deficiency 3 months ago.  Management since that visit includes Vit D rich diet. She reports excellent compliance with treatment. She is not having side effects.    ---------------------------------------------------------------------------------------------------    Current Outpatient Medications on File Prior to Visit  Medication Sig Dispense Refill   albuterol (PROVENTIL HFA;VENTOLIN HFA) 108 (90 BASE) MCG/ACT inhaler Inhale 1-2 puffs into the lungs every 6 (six) hours as needed for wheezing or shortness of breath.      aspirin EC 81 MG tablet Take 81 mg by mouth daily. Swallow whole.     azelastine  (OPTIVAR) 0.05 % ophthalmic solution Place 1 drop into both eyes 2 (two) times daily as needed for allergies (allergies).     BREO ELLIPTA 200-25 MCG/ACT AEPB Inhale 1 puff into the lungs daily.     cyclobenzaprine (FLEXERIL) 10 MG tablet Take 10 mg by mouth 3 (three) times daily as needed.     EPINEPHrine 0.3 mg/0.3 mL IJ SOAJ injection Inject 0.3 mg into the muscle as needed for anaphylaxis.     gabapentin (NEURONTIN) 300 MG capsule Take 300 mg by mouth 3 (three) times daily.     ipratropium (ATROVENT) 0.02 % nebulizer solution Take 3 mLs by nebulization every 6 (six) hours as needed for wheezing or shortness of breath.     levocetirizine (XYZAL) 5 MG tablet Take 5 mg by mouth every evening.     metoprolol succinate (TOPROL-XL) 25 MG 24 hr tablet TAKE 1 TABLET (25 MG TOTAL) BY MOUTH DAILY. 90 tablet 3   montelukast (SINGULAIR) 10 MG tablet Take 10 mg by mouth at bedtime.     No current facility-administered medications on file prior to visit.   Past Medical History:  Diagnosis Date   Acute laryngopharyngitis 01/13/2020   Acute non-recurrent frontal sinusitis 09/22/2017   Allergic rhinitis 02/15/2021   Allergic rhinitis due to animal (cat) (dog) hair and dander 02/15/2021   Allergic rhinitis due to pollen 02/15/2021   Allergy    dust, environmental, feathers, has epipen, prn   Allergy-induced asthma    Anemia    Annual physical exam 02/15/2021   Arthritis    osteoarthritis   Arthritis of right acromioclavicular joint 06/15/2019   Asthma 12/28/2015   Asthma with exacerbation 04/27/2020   Atrial fibrillation ([place]) 05/20/2020   Atrial fibrillation with RVR ([place]) 05/20/2020   Back pain    Benign essential hypertension 12/28/2015   Bilateral lower extremity edema 08/04/2018   Chronic allergic conjunctivitis 02/15/2021   Encounter for screening colonoscopy 01/08/2017   Encounter for screening mammogram for breast cancer 02/15/2021   Glenoid labral tear, right, initial encounter 06/15/2019    Headache    Healthcare maintenance 01/08/2017   High risk medication use 12/28/2015   History of seasonal allergies    Hordeolum internum of right lower eyelid 02/14/2017   Hypertensive heart disease 06/02/2020   Insulin resistance 10/01/2019   Joint pain    Lower extremity edema    Lower respiratory infection (e.g., bronchitis, pneumonia, pneumonitis, pulmonitis) 05/10/2009   Qualifier: Diagnosis of  By: Gwenette Greet MD, Armando Reichert    Migraine headache 12/28/2015   Mild persistent asthma, uncomplicated 02/25/6282   Mixed hyperlipidemia 12/28/2015   Morbid obesity ([place])    Myalgia 12/28/2015   Need for shingles vaccine 02/15/2021   Osteoarthritis    Other chest pain 02/15/2021  Other specified menopausal and perimenopausal disorders 02/15/2021   Pain of right eye 02/14/2017   Pain of right foot 08/30/2016   Persistent cough for 3 weeks or longer 10/11/2017   Pharyngitis 05/23/2018   Rotator cuff tear, right 06/15/2019   Shortness of breath    maybe with exertion   Skin lesion of foot 08/30/2016   Sleep apnea    uses cpap   Sleep apnea with use of continuous positive airway pressure (CPAP) 02/15/2021   Sore throat 05/11/2018   Unspecified open wound, left lower leg, initial encounter 08/04/2018   Vertigo    Vitamin D deficiency    Past Surgical History:  Procedure Laterality Date   ABDOMINAL HYSTERECTOMY     COLONOSCOPY  09/2010   normal   FRACTURE SURGERY     left 5th finger fx with bone graft repair   HEEL SPUR EXCISION     JOINT REPLACEMENT     KNEE ARTHROSCOPY Left    Left Knee lateral release     LIPOMA EXCISION     SHOULDER ARTHROSCOPY WITH ROTATOR CUFF REPAIR AND SUBACROMIAL DECOMPRESSION Right 06/15/2019   Procedure: SHOULDER ARTHROSCOPY WITH ROTATOR CUFF REPAIR AND SUBACROMIAL DECOMPRESSION;  Surgeon: Dorna Leitz, MD;  Location: [facility];  Service: Orthopedics;  Laterality: Right;   SVT ABLATION N/A 12/06/2020   Procedure: SVT ABLATION;  Surgeon: Constance Haw, MD;   Location: [facility];  Service: Cardiovascular;  Laterality: N/A;   TONSILLECTOMY     TOTAL KNEE ARTHROPLASTY  08/03/2011   Procedure: TOTAL KNEE ARTHROPLASTY;  Surgeon: Alta Corning;  Location: [facility];  Service: Orthopedics;  Laterality: Right;  COMPUTER ASSISTED TOTAL KNEE REPLACEMENTwith revision tibial component   TUBAL LIGATION      Family History  Problem Relation Age of Onset   Hypertension Mother    Heart disease Mother    Heart attack Mother    Hyperlipidemia Mother    Stroke Mother    Obesity Mother    Cancer Father    Liver disease Father    Obesity Father    Anesthesia problems Neg Hx    Hypotension Neg Hx    Malignant hyperthermia Neg Hx    Pseudochol deficiency Neg Hx    Colon cancer Neg Hx    Colon polyps Neg Hx    Esophageal cancer Neg Hx    Rectal cancer Neg Hx    Stomach cancer Neg Hx    Social History   Socioeconomic History   Marital status: Married    Spouse name: Malu Pellegrini   Number of children: 2   Years of education: Not on file   Highest education level: Not on file  Occupational History   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  Tobacco Use   Smoking status: Never    Passive exposure: Never   Smokeless tobacco: Never  Vaping Use   Vaping Use: Never used  Substance and Sexual Activity   Alcohol use: Not Currently   Drug use: Never   Sexual activity: Yes  Other Topics Concern   Not on file  Social History Narrative   Not on file   Social Determinants of Health   Financial Resource Strain: Not on file  Food Insecurity: Not on file  Transportation Needs: Not on file  Physical Activity: Not on file  Stress: Not on file  Social Connections: Not on file    Review of Systems  Constitutional:  Negative for appetite change, fatigue and fever.  HENT:  Negative for congestion, ear pain, sinus pressure and sore throat.   Respiratory:  Negative for cough, chest tightness, shortness of breath and wheezing.   Cardiovascular:   Negative for chest pain and palpitations.  Gastrointestinal:  Negative for abdominal pain, constipation, diarrhea, nausea and vomiting.  Genitourinary:  Negative for dysuria and hematuria.  Musculoskeletal:  Negative for arthralgias, back pain, joint swelling and myalgias.  Skin:  Negative for rash.  Neurological:  Negative for dizziness, weakness and headaches.  Psychiatric/Behavioral:  Negative for dysphoric mood. The patient is not nervous/anxious.      Objective:  BP 138/78 (BP Location: Left Arm, Patient Position: Sitting)   Pulse 85   Temp 97.8 F (36.6 C) (Oral)   Ht 5' 2"  (1.575 m)   Wt 264 lb (119.7 kg)   SpO2 96%   BMI 48.29 kg/m      03/14/2022    2:44 PM 12/11/2021    7:48 AM 11/02/2021   11:41 AM  BP/Weight  Systolic BP 283 662 947  Diastolic BP 78 72 70  Wt. (Lbs) 264 261 261.4  BMI 48.29 kg/m2 47.74 kg/m2 49.39 kg/m2    Physical Exam Vitals reviewed.  Constitutional:      Appearance: She is obese.  Cardiovascular:     Rate and Rhythm: Normal rate and regular rhythm.     Pulses: Normal pulses.     Heart sounds: Normal heart sounds.  Pulmonary:     Effort: Pulmonary effort is normal.     Breath sounds: Normal breath sounds.  Skin:    General: Skin is warm and dry.     Capillary Refill: Capillary refill takes less than 2 seconds.  Neurological:     General: No focal deficit present.     Mental Status: She is alert and oriented to person, place, and time.  Psychiatric:        Mood and Affect: Mood normal.        Behavior: Behavior normal.     Lab Results  Component Value Date   WBC 8.9 09/11/2021   HGB 14.0 09/11/2021   HCT 42.6 09/11/2021   PLT 322 09/11/2021   GLUCOSE 89 09/11/2021   CHOL 169 09/11/2021   TRIG 123 09/11/2021   HDL 42 09/11/2021   LDLCALC 105 (H) 09/11/2021   ALT 12 09/11/2021   AST 11 09/11/2021   NA 142 09/11/2021   K 5.1 09/11/2021   CL 105 09/11/2021   CREATININE 0.77 09/11/2021   BUN 14 09/11/2021   CO2 26  09/11/2021   TSH 2.420 09/11/2021   INR 1.53 (H) 08/06/2011   HGBA1C 5.5 02/13/2021      Assessment & Plan:   1. Benign essential hypertension - CBC with Differential/Platelet - Comprehensive metabolic panel - Lipid panel - Hemoglobin A1c  2. Class 3 severe obesity due to excess calories with serious comorbidity and body mass index (BMI) of 45.0 to 49.9 in adult (HCC) - CBC with Differential/Platelet - Comprehensive metabolic panel - Lipid panel - Hemoglobin A1c - Naltrexone-buPROPion HCl ER (CONTRAVE) 8-90 MG TB12; Take 1 tablet by mouth daily.  Dispense: 120 tablet; Refill: 0  3. Vitamin D deficiency - Vitamin D, 25-hydroxy  4. Mixed hyperlipidemia - CBC with Differential/Platelet - Comprehensive metabolic panel - Lipid panel  5. Hypertensive heart disease without heart failure - CBC with Differential/Platelet - Comprehensive metabolic panel - Lipid panel  6. Impaired fasting glucose - CBC with Differential/Platelet - Comprehensive metabolic panel - Hemoglobin A1c  7. Encounter  for screening mammogram for malignant neoplasm of breast - MM Digital Screening; Future   .      We will call you with lab results Continue medications Heart healthy diet Increase physical activity We will send Contrave for weight loss   Follow-up: 19-month fasting  An After Visit Summary was printed and given to the patient.  I, SRip Harbour NP, have reviewed all documentation for this visit. The documentation on 03/14/22 for the exam, diagnosis, procedures, and orders are all accurate and complete.   I,Lauren M Auman,acting as a sEducation administratorfor [employer] NP.,have documented all relevant documentation on the behalf of SRip Harbour NP,as directed by  SRip Harbour NP while in the presence of SRip Harbour NP.   Signed, SRip Harbour NP [place]([phone]

## 2022-03-14 NOTE — Patient Instructions (Addendum)
We will call you with lab results Continue medications Heart healthy diet Increase physical activity We will send Contrave for weight loss  Exercises to do While Sitting  Exercises that you do while sitting (chair exercises) can give you many of the same benefits as full exercise. Benefits include strengthening your heart, burning calories, and keeping muscles and joints healthy. Exercise can also improve your mood and help with depression and anxiety. You may benefit from chair exercises if you are unable to do standing exercises due to: Diabetic foot pain. Obesity. Illness. Arthritis. Recovery from surgery or injury. Breathing problems. Balance problems. Another type of disability. Before starting chair exercises, check with your health care provider or a physical therapist to find out how much exercise you can tolerate and which exercises are safe for you. If your health care provider approves: Start out slowly and build up over time. Aim to work up to about 10-20 minutes for each exercise session. Make exercise part of your daily routine. Drink water when you exercise. Do not wait until you are thirsty. Drink every 10-15 minutes. Stop exercising right away if you have pain, nausea, shortness of breath, or dizziness. If you are exercising in a wheelchair, make sure to lock the wheels. Ask your health care provider whether you can do tai chi or yoga. Many positions in these mind-body exercises can be modified to do while seated. Warm-up Before starting other exercises: Sit up as straight as you can. Have your knees bent at 90 degrees, which is the shape of the capital letter "L." Keep your feet flat on the floor. Sit at the front edge of your chair, if you can. Pull in (tighten) the muscles in your abdomen and stretch your spine and neck as straight as you can. Hold this position for a few minutes. Breathe in and out evenly. Try to concentrate on your breathing, and relax your  mind. Stretching Exercise A: Arm stretch Hold your arms out straight in front of your body. Bend your hands at the wrist with your fingers pointing up, as if signaling someone to stop. Notice the slight tension in your forearms as you hold the position. Keeping your arms out and your hands bent, rotate your hands outward as far as you can and hold this stretch. Aim to have your thumbs pointing up and your pinkie fingers pointing down. Slowly repeat arm stretches for one minute as tolerated. Exercise B: Leg stretch If you can move your legs, try to "draw" letters on the floor with the toes of your foot. Write your name with one foot. Write your name with the toes of your other foot. Slowly repeat the movements for one minute as tolerated. Exercise C: Reach for the sky Reach your hands as far over your head as you can to stretch your spine. Move your hands and arms as if you are climbing a rope. Slowly repeat the movements for one minute as tolerated. Range of motion exercises Exercise A: Shoulder roll Let your arms hang loosely at your sides. Lift just your shoulders up toward your ears, then let them relax back down. When your shoulders feel loose, rotate your shoulders in backward and forward circles. Do shoulder rolls slowly for one minute as tolerated. Exercise B: March in place As if you are marching, pump your arms and lift your legs up and down. Lift your knees as high as you can. If you are unable to lift your knees, just pump your arms and move your  ankles and feet up and down. March in place for one minute as tolerated. Exercise C: Seated jumping jacks Let your arms hang down straight. Keeping your arms straight, lift them up over your head. Aim to point your fingers to the ceiling. While you lift your arms, straighten your legs and slide your heels along the floor to your sides, as wide as you can. As you bring your arms back down to your sides, slide your legs back  together. If you are unable to use your legs, just move your arms. Slowly repeat seated jumping jacks for one minute as tolerated. Strengthening exercises Exercise A: Shoulder squeeze Hold your arms straight out from your body to your sides, with your elbows bent and your fists pointed at the ceiling. Keeping your arms in the bent position, move them forward so your elbows and forearms meet in front of your face. Open your arms back out as wide as you can with your elbows still bent, until you feel your shoulder blades squeezing together. Hold for 5 seconds. Slowly repeat the movements forward and backward for one minute as tolerated. Contact a health care provider if: You have to stop exercising due to any of the following: Pain. Nausea. Shortness of breath. Dizziness. Fatigue. You have significant pain or soreness after exercising. Get help right away if: You have chest pain. You have difficulty breathing. These symptoms may represent a serious problem that is an emergency. Do not wait to see if the symptoms will go away. Get medical help right away. Call your local emergency services (911 in the [place] Do not drive yourself to the hospital. Summary Exercises that you do while sitting (chair exercises) can strengthen your heart, burn calories, and keep muscles and joints healthy. You may benefit from chair exercises if you are unable to do standing exercises due to diabetic foot pain, obesity, recovery from surgery or injury, or other conditions. Before starting chair exercises, check with your health care provider or a physical therapist to find out how much exercise you can tolerate and which exercises are safe for you. This information is not intended to replace advice given to you by your health care provider. Make sure you discuss any questions you have with your health care provider. Document Revised: 10/02/2020 Document Reviewed: 10/02/2020 Elsevier Patient Education  [place].  Naltrexone; Bupropion Extended-Release Tablets What is this medication? NALTREXONE; BUPROPION (nal TREX one; byoo PROE pee on) promotes weight loss. It may also be used to maintain weight loss. It works by decreasing appetite. Changes to diet and exercise are often combined with this medication. This medicine may be used for other purposes; ask your health care provider or pharmacist if you have questions. COMMON BRAND NAME(S): Contrave What should I tell my care team before I take this medication? They need to know if you have any of these conditions: An eating disorder, such as anorexia or bulimia Diabetes Depression Frequently drink alcohol Glaucoma Head injury Heart disease High blood pressure History of a tumor or infection of your brain or spine History of heart attack or stroke History of irregular heartbeat History of substance use disorder or alcohol use disorder Kidney disease Liver disease Low levels of sodium in the blood Mental health condition Seizures Suicidal thoughts, plans, or attempt by you or a family member Taken an MAOI like Carbex, Eldepryl, Marplan, Nardil, or Parnate in last 14 days An unusual or allergic reaction to bupropion, naltrexone, other medications, foods, dyes, or preservatives Breast-feeding  Pregnant or trying to become pregnant How should I use this medication? Take this medication by mouth with a full glass of water. Take it as directed on the prescription label at the same time every day. Do not cut, crush, or chew this medication. Swallow the tablets whole. You can take it with or without food. Do not take with high-fat meals as this may increase your risk of seizures. Keep taking it unless your care team tells you to stop. A special MedGuide will be given to you by the pharmacist with each prescription and refill. Be sure to read this information carefully each time. Talk to your care team about the use of this medication in  children. Special care may be needed. Overdosage: If you think you have taken too much of this medicine contact a poison control center or emergency room at once. NOTE: This medicine is only for you. Do not share this medicine with others. What if I miss a dose? If you miss a dose, skip it. Take your next dose at the normal time. Do not take extra or 2 doses at the same time to make up for the missed dose. What may interact with this medication? Do not take this medication with any of the following: Any medications used to stop taking opioids, such as methadone or buprenorphine Linezolid MAOIs, such as Carbex, Eldepryl, Marplan, Nardil, and Parnate Methylene blue (injected into a vein) Opioid medications Other medications that contain bupropion, such as Zyban or Wellbutrin This medication may also interact with the following: Alcohol Certain medications for blood pressure, such as metoprolol, propranolol Certain medications for depression, anxiety, or mental health conditions Certain medications for HIV or hepatitis Certain medications for irregular heart beat, such as propafenone, flecainide Certain medications for Parkinson disease, such as amantadine, levodopa Certain medications for seizures, such as carbamazepine, phenytoin, phenobarbital Certain medications for sleep Cimetidine Clopidogrel Cyclophosphamide Digoxin Disulfiram Furazolidone Isoniazid Nicotine Orphenadrine Procarbazine Steroid medications, such as prednisone or cortisone Stimulant medications for attention disorders, weight loss, or to stay awake Tamoxifen Theophylline Thiotepa Ticlopidine Tramadol Warfarin This list may not describe all possible interactions. Give your health care provider a list of all the medicines, herbs, non-prescription drugs, or dietary supplements you use. Also tell them if you smoke, drink alcohol, or use illegal drugs. Some items may interact with your medicine. What should I  watch for while using this medication? Visit your care team for regular checks on your progress. This medication may cause serious skin reactions. They can happen weeks to months after starting the medication. Contact your care team right away if you notice fevers or flu-like symptoms with a rash. The rash may be red or purple and then turn into blisters or peeling of the skin. Or, you might notice a red rash with swelling of the face, lips, or lymph nodes in your neck or under your arms. This medication may affect blood sugar. Ask your care team if changes in diet or medications are needed if you have diabetes. Patients and their families should watch out for new or worsening depression or thoughts of suicide. This includes sudden changes in mood, behaviors, or thoughts. These changes can happen at any time but are more common in the beginning of treatment or after a change in dose. Call your care team right away if you experience these thoughts or worsening depression. Avoid alcoholic drinks while taking this medication. Drinking large amounts of alcoholic beverages, using sleeping or anxiety medications, or quickly stopping the  use of these agents while taking this medication may increase your risk for a seizure. Do not drive or use heavy machinery until you know how this medication affects you. This medication can impair your ability to perform these tasks. Inform your care team if you wish to become pregnant or think you might be pregnant. Losing weight while pregnant is not advised and may cause harm to the unborn child. Talk to your care team for more information. What side effects may I notice from receiving this medication? Side effects that you should report to your care team as soon as possible: Allergic reactions--skin rash, itching, hives, swelling of the face, lips, tongue, or throat Fast or irregular heartbeat Increase in blood pressure Liver injury--right upper belly pain, loss of  appetite, nausea, light-colored stool, dark yellow or brown urine, yellowing skin or eyes, unusual weakness or fatigue Mood and behavior changes--anxiety, nervousness, confusion, hallucinations, irritability, hostility, thoughts of suicide or self-harm, worsening mood, feelings of depression Redness, blistering, peeling, or loosening of the skin, including inside the mouth Seizures Sudden eye pain or change in vision such as blurry vision, seeing halos around lights, vision loss Side effects that usually do not require medical attention (report to your care team if they continue or are bothersome): Constipation Dizziness Dry mouth Fatigue Headache Nausea Trouble sleeping Vomiting This list may not describe all possible side effects. Call your doctor for medical advice about side effects. You may report side effects to FDA at 1-800-FDA-1088. Where should I keep my medication? Keep out of the reach of children and pets. Store between 15 and 30 degrees C (59 and 86 degrees F). Get rid of any unused medication after the expiration date. To get rid of medications that are no longer needed or have expired: Take the medication to a medication take-back program. Check with your pharmacy or law enforcement to find a location. If you cannot return the medication, check the label or package insert to see if the medication should be thrown out in the garbage or flushed down the toilet. If you are not sure, ask your care team. If it is safe to put it in the trash, pour the medication out of the container. Mix the medication with cat litter, dirt, coffee grounds, or other unwanted substance. Seal the mixture in a bag or container. Put it in the trash. NOTE: This sheet is a summary. It may not cover all possible information. If you have questions about this medicine, talk to your doctor, pharmacist, or health care provider.  2023 Elsevier/Gold Standard (2021-10-02 00:00:00)   Vitamin D Deficiency Vitamin  D deficiency is when your body does not have enough vitamin D. Vitamin D is important because: It helps your body use certain minerals. It helps to keep your bones healthy. It lessens irritation and swelling (inflammation). It helps the body's defense system (immune system) work better. Not getting enough vitamin D can make your bones soft. What are the causes? Not eating enough foods that have vitamin D in them. Not getting enough sun. Having diseases that make it hard for your body to take in vitamin D. Having had part of your stomach or part of your small intestine taken out. What increases the risk? Being an older adult. Not spending much time outdoors. Living in a long-term care center. Having dark skin. Taking certain medicines. Being overweight or very overweight (obese). Having long-term (chronic) kidney or liver disease. What are the signs or symptoms? In mild cases, there may be  no symptoms. If the condition is very bad, symptoms may include: Bone pain. Muscle pain. Not being able to walk normally. Bones that break easily. Joint pain. How is this treated? Treatment may include taking supplements as told by your doctor. Your doctor will tell you what dose is best for you. This may include taking: Vitamin D. Calcium. Follow these instructions at home: Eating and drinking Eat foods that have vitamin D in them, such as: Dairy products, cereals, or juices that have vitamin D added to them (are fortified). Check the label. Fish, such as salmon or trout. Eggs. The vitamin D is in the yolk. Mushrooms that were treated with UV light. Beef liver. The items listed above may not be a complete list of foods and beverages you can eat and drink. Contact a dietitian for more information. General instructions Take over-the-counter and prescription medicines only as told by your doctor. Take supplements only as told by your doctor. Get sunlight in a safe way. Do not use a tanning  bed. Stay at a healthy weight. Lose weight if you need to. Keep all follow-up visits. How is this prevented? Eating foods that naturally have vitamin D in them. Eating or drinking foods and drinks that have vitamin D added to them, such as cereals, juices, and milk. Taking vitamin D or a multivitamin that has vitamin D in it. Being in the sun. Your body makes vitamin D when your skin gets sunlight. Contact a doctor if: Your symptoms do not go away. You feel like you may vomit (nauseous). You vomit. You poop less often than normal, or you have trouble pooping (constipation). Summary Vitamin D deficiency is when your body does not have enough vitamin D. Vitamin D helps to keep your bones healthy. This condition is often treated by taking a supplement. Your doctor will tell you what dose is best for you. This information is not intended to replace advice given to you by your health care provider. Make sure you discuss any questions you have with your health care provider. Document Revised: 05/12/2021 Document Reviewed: 05/12/2021 Elsevier Patient Education  [place].

## 2022-03-15 LAB — COMPREHENSIVE METABOLIC PANEL
ALT: 17 IU/L (ref 0–32)
AST: 18 IU/L (ref 0–40)
Albumin/Globulin Ratio: 1.4 (ref 1.2–2.2)
Albumin: 4.1 g/dL (ref 3.9–4.9)
Alkaline Phosphatase: 91 IU/L (ref 44–121)
BUN/Creatinine Ratio: 17 (ref 12–28)
BUN: 13 mg/dL (ref 8–27)
Bilirubin Total: 0.5 mg/dL (ref 0.0–1.2)
CO2: 22 mmol/L (ref 20–29)
Calcium: 9.1 mg/dL (ref 8.7–10.3)
Chloride: 105 mmol/L (ref 96–106)
Creatinine, Ser: 0.78 mg/dL (ref 0.57–1.00)
Globulin, Total: 3 g/dL (ref 1.5–4.5)
Glucose: 120 mg/dL — ABNORMAL HIGH (ref 70–99)
Potassium: 4.7 mmol/L (ref 3.5–5.2)
Sodium: 142 mmol/L (ref 134–144)
Total Protein: 7.1 g/dL (ref 6.0–8.5)
eGFR: 84 mL/min/{1.73_m2} (ref >59)

## 2022-03-15 LAB — CBC WITH DIFFERENTIAL/PLATELET
Basophils Absolute: 0 10*3/uL (ref 0.0–0.2)
Basos: 0 %
EOS (ABSOLUTE): 0 10*3/uL (ref 0.0–0.4)
Eos: 0 %
Hematocrit: 44.5 % (ref 34.0–46.6)
Hemoglobin: 14.3 g/dL (ref 11.1–15.9)
Immature Grans (Abs): 0.1 10*3/uL (ref 0.0–0.1)
Immature Granulocytes: 1 %
Lymphocytes Absolute: 2.5 10*3/uL (ref 0.7–3.1)
Lymphs: 16 %
MCH: 28.3 pg (ref 26.6–33.0)
MCHC: 32.1 g/dL (ref 31.5–35.7)
MCV: 88 fL (ref 79–97)
Monocytes Absolute: 0.5 10*3/uL (ref 0.1–0.9)
Monocytes: 4 %
Neutrophils Absolute: 12.1 10*3/uL — ABNORMAL HIGH (ref 1.4–7.0)
Neutrophils: 79 %
Platelets: 357 10*3/uL (ref 150–450)
RBC: 5.06 x10E6/uL (ref 3.77–5.28)
RDW: 13.3 % (ref 11.7–15.4)
WBC: 15.2 10*3/uL — ABNORMAL HIGH (ref 3.4–10.8)

## 2022-03-15 LAB — CARDIOVASCULAR RISK ASSESSMENT

## 2022-03-15 LAB — HEMOGLOBIN A1C
Est. average glucose Bld gHb Est-mCnc: 114 mg/dL
Hgb A1c MFr Bld: 5.6 % (ref 4.8–5.6)

## 2022-03-15 LAB — LIPID PANEL
Chol/HDL Ratio: 4 ratio (ref 0.0–4.4)
Cholesterol, Total: 212 mg/dL — ABNORMAL HIGH (ref 100–199)
HDL: 53 mg/dL (ref >39)
LDL Chol Calc (NIH): 141 mg/dL — ABNORMAL HIGH (ref 0–99)
Triglycerides: 102 mg/dL (ref 0–149)
VLDL Cholesterol Cal: 18 mg/dL (ref 5–40)

## 2022-03-15 LAB — VITAMIN D 25 HYDROXY (VIT D DEFICIENCY, FRACTURES): Vit D, 25-Hydroxy: 22.4 ng/mL — ABNORMAL LOW (ref 30.0–100.0)

## 2022-03-19 ENCOUNTER — Other Ambulatory Visit: Payer: Self-pay

## 2022-03-19 MED ORDER — VITAMIN D (ERGOCALCIFEROL) 1.25 MG (50000 UNIT) PO CAPS: 50000.0000 [IU] | ORAL_CAPSULE | ORAL | 0 refills | Status: DC

## 2022-03-28 ENCOUNTER — Encounter (INDEPENDENT_AMBULATORY_CARE_PROVIDER_SITE_OTHER): Payer: Self-pay

## 2022-04-18 ENCOUNTER — Ambulatory Visit
Admission: RE | Admit: 2022-04-18 | Discharge: 2022-04-18 | Disposition: A | Discharge status: Home / Self Care | Payer: Medicare Other | Source: Ambulatory Visit | Attending: Nurse Practitioner | Admitting: Nurse Practitioner

## 2022-04-18 DIAGNOSIS — Z1231 Encounter for screening mammogram for malignant neoplasm of breast: Secondary | ICD-10-CM

## 2022-05-14 ENCOUNTER — Other Ambulatory Visit: Payer: Self-pay

## 2022-06-18 ENCOUNTER — Ambulatory Visit (INDEPENDENT_AMBULATORY_CARE_PROVIDER_SITE_OTHER): Payer: Medicare Other | Admitting: Nurse Practitioner

## 2022-06-18 ENCOUNTER — Encounter: Payer: Self-pay | Admitting: Nurse Practitioner

## 2022-06-18 VITALS — BP 140/90 | HR 92 | Temp 97.3°F | Resp 16 | Ht 62.0 in | Wt 274.2 lb

## 2022-06-18 DIAGNOSIS — E559 Vitamin D deficiency, unspecified: Secondary | ICD-10-CM

## 2022-06-18 DIAGNOSIS — E782 Mixed hyperlipidemia: Secondary | ICD-10-CM

## 2022-06-18 DIAGNOSIS — J4520 Mild intermittent asthma, uncomplicated: Secondary | ICD-10-CM

## 2022-06-18 DIAGNOSIS — I119 Hypertensive heart disease without heart failure: Secondary | ICD-10-CM

## 2022-06-18 MED ORDER — VALSARTAN 40 MG PO TABS: 40.0000 mg | ORAL_TABLET | Freq: Every day | ORAL | 0 refills | Status: DC

## 2022-06-18 NOTE — Progress Notes (Signed)
Subjective:  Patient ID: Rebecca Rojas, adult    DOB: May 17, 1956  Age: 66 y.o. MRN: [id number]  Chief Complaint  Patient presents with   Hypertension   Obesity    HPI  Pt presents for follow-up of HTN, Hyperlipidemia, and asthma. She had flu and COVID-19 vaccines recently. Up to date on eye exam, mammogram, and colonoscopy.    Hypertension, follow-up: She was last seen for hypertension 3 months ago.  BP at that visit was 138/78. Management includes Metoprolol 25 mg QD.Pt has elevated BP today. She has increased stress related to aging mother who recently fell and broke her left hip and shoulder.  She reports good compliance with treatment. She is not having side effects.  She is following a Regular diet. She is not exercising. She does not smoke. Use of agents associated with hypertension: none.  Outside blood pressures are not being checked.  .  Pertinent labs: Lab Results  Component Value Date   CHOL 212 (H) 03/14/2022   HDL 53 03/14/2022   LDLCALC 141 (H) 03/14/2022   TRIG 102 03/14/2022   CHOLHDL 4.0 03/14/2022   Lab Results  Component Value Date   NA 142 03/14/2022   K 4.7 03/14/2022   CREATININE 0.78 03/14/2022   EGFR 84 03/14/2022   GFRNONAA 93 06/13/2020   GLUCOSE 120 (H) 03/14/2022     Lipid/Cholesterol, Follow-up  Last lipid panel Other pertinent labs  Lab Results  Component Value Date   CHOL 212 (H) 03/14/2022   HDL 53 03/14/2022   LDLCALC 141 (H) 03/14/2022   TRIG 102 03/14/2022   CHOLHDL 4.0 03/14/2022   Lab Results  Component Value Date   ALT 17 03/14/2022   AST 18 03/14/2022   PLT 357 03/14/2022   TSH 2.420 09/11/2021     She was last seen for this 3 months ago.  Management includes diet modification. Current diet: well balanced Current exercise: none  The 10-year ASCVD risk score (Arnett DK, et al., 2019) is: 10.3%     Allergy induced asthma, follow-up: Rebecca Rojas has life-long allergy induced asthma. Current treatment includes  Singulair, Xazal, Breo inhaler, Albuterol, and Proventil. States symptoms are currently well-controlled. She is up-to-date on pneumonia vaccine. Will consider RSV vaccine.   Current Outpatient Medications on File Prior to Visit  Medication Sig Dispense Refill   albuterol (PROVENTIL HFA;VENTOLIN HFA) 108 (90 BASE) MCG/ACT inhaler Inhale 1-2 puffs into the lungs every 6 (six) hours as needed for wheezing or shortness of breath.      aspirin EC 81 MG tablet Take 81 mg by mouth daily. Swallow whole.     azelastine (OPTIVAR) 0.05 % ophthalmic solution Place 1 drop into both eyes 2 (two) times daily as needed for allergies (allergies).     BREO ELLIPTA 200-25 MCG/ACT AEPB Inhale 1 puff into the lungs daily.     cyclobenzaprine (FLEXERIL) 10 MG tablet Take 10 mg by mouth 3 (three) times daily as needed.     EPINEPHrine 0.3 mg/0.3 mL IJ SOAJ injection Inject 0.3 mg into the muscle as needed for anaphylaxis.     gabapentin (NEURONTIN) 300 MG capsule Take 300 mg by mouth 3 (three) times daily.     ipratropium (ATROVENT) 0.02 % nebulizer solution Take 3 mLs by nebulization every 6 (six) hours as needed for wheezing or shortness of breath.     levocetirizine (XYZAL) 5 MG tablet Take 5 mg by mouth every evening.     metoprolol succinate (TOPROL-XL) 25 MG 24  hr tablet TAKE 1 TABLET (25 MG TOTAL) BY MOUTH DAILY. 90 tablet 3   montelukast (SINGULAIR) 10 MG tablet Take 10 mg by mouth at bedtime.     Vitamin D, Ergocalciferol, (DRISDOL) 1.25 MG (50000 UNIT) CAPS capsule Take 1 capsule (50,000 Units total) by mouth every 7 (seven) days. 15 capsule 0   No current facility-administered medications on file prior to visit.   Past Medical History:  Diagnosis Date   Acute laryngopharyngitis 01/13/2020   Acute non-recurrent frontal sinusitis 09/22/2017   Allergic rhinitis 02/15/2021   Allergic rhinitis due to animal (cat) (dog) hair and dander 02/15/2021   Allergic rhinitis due to pollen 02/15/2021   Allergy    dust,  environmental, feathers, has epipen, prn   Allergy-induced asthma    Anemia    Annual physical exam 02/15/2021   Arthritis    osteoarthritis   Arthritis of right acromioclavicular joint 06/15/2019   Asthma 12/28/2015   Asthma with exacerbation 04/27/2020   Atrial fibrillation ([place]) 05/20/2020   Atrial fibrillation with RVR ([place]) 05/20/2020   Back pain    Benign essential hypertension 12/28/2015   Bilateral lower extremity edema 08/04/2018   Chronic allergic conjunctivitis 02/15/2021   Encounter for screening colonoscopy 01/08/2017   Encounter for screening mammogram for breast cancer 02/15/2021   Glenoid labral tear, right, initial encounter 06/15/2019   Headache    Healthcare maintenance 01/08/2017   High risk medication use 12/28/2015   History of seasonal allergies    Hordeolum internum of right lower eyelid 02/14/2017   Hypertensive heart disease 06/02/2020   Insulin resistance 10/01/2019   Joint pain    Lower extremity edema    Lower respiratory infection (e.g., bronchitis, pneumonia, pneumonitis, pulmonitis) 05/10/2009   Qualifier: Diagnosis of  By: Gwenette Greet MD, Armando Reichert    Migraine headache 12/28/2015   Mild persistent asthma, uncomplicated 2/62/0355   Mixed hyperlipidemia 12/28/2015   Morbid obesity ([place])    Myalgia 12/28/2015   Need for shingles vaccine 02/15/2021   Osteoarthritis    Other chest pain 02/15/2021   Other specified menopausal and perimenopausal disorders 02/15/2021   Pain of right eye 02/14/2017   Pain of right foot 08/30/2016   Persistent cough for 3 weeks or longer 10/11/2017   Pharyngitis 05/23/2018   Rotator cuff tear, right 06/15/2019   Shortness of breath    maybe with exertion   Skin lesion of foot 08/30/2016   Sleep apnea    uses cpap   Sleep apnea with use of continuous positive airway pressure (CPAP) 02/15/2021   Sore throat 05/11/2018   Unspecified open wound, left lower leg, initial encounter 08/04/2018   Vertigo    Vitamin D deficiency     Past Surgical History:  Procedure Laterality Date   ABDOMINAL HYSTERECTOMY     COLONOSCOPY  09/2010   normal   FRACTURE SURGERY     left 5th finger fx with bone graft repair   HEEL SPUR EXCISION     JOINT REPLACEMENT     KNEE ARTHROSCOPY Left    Left Knee lateral release     LIPOMA EXCISION     SHOULDER ARTHROSCOPY WITH ROTATOR CUFF REPAIR AND SUBACROMIAL DECOMPRESSION Right 06/15/2019   Procedure: SHOULDER ARTHROSCOPY WITH ROTATOR CUFF REPAIR AND SUBACROMIAL DECOMPRESSION;  Surgeon: Dorna Leitz, MD;  Location: [facility];  Service: Orthopedics;  Laterality: Right;   SVT ABLATION N/A 12/06/2020   Procedure: SVT ABLATION;  Surgeon: Constance Haw, MD;  Location: [facility];  Service:  Cardiovascular;  Laterality: N/A;   TONSILLECTOMY     TOTAL KNEE ARTHROPLASTY  08/03/2011   Procedure: TOTAL KNEE ARTHROPLASTY;  Surgeon: Alta Corning;  Location: [facility];  Service: Orthopedics;  Laterality: Right;  COMPUTER ASSISTED TOTAL KNEE REPLACEMENTwith revision tibial component   TUBAL LIGATION      Family History  Problem Relation Age of Onset   Hypertension Mother    Heart disease Mother    Heart attack Mother    Hyperlipidemia Mother    Stroke Mother    Obesity Mother    Cancer Father    Liver disease Father    Obesity Father    Anesthesia problems Neg Hx    Hypotension Neg Hx    Malignant hyperthermia Neg Hx    Pseudochol deficiency Neg Hx    Colon cancer Neg Hx    Colon polyps Neg Hx    Esophageal cancer Neg Hx    Rectal cancer Neg Hx    Stomach cancer Neg Hx    Breast cancer Neg Hx    Social History   Socioeconomic History   Marital status: Married    Spouse name: Glenyce Randle   Number of children: 2   Years of education: Not on file   Highest education level: Not on file  Occupational History   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  Tobacco Use   Smoking status: Never    Passive exposure: Never   Smokeless tobacco: Never  Vaping Use   Vaping Use:  Never used  Substance and Sexual Activity   Alcohol use: Not Currently   Drug use: Never   Sexual activity: Yes  Other Topics Concern   Not on file  Social History Narrative   Not on file   Social Determinants of Health   Financial Resource Strain: Not on file  Food Insecurity: Not on file  Transportation Needs: Not on file  Physical Activity: Not on file  Stress: Not on file  Social Connections: Not on file    Review of Systems  Constitutional:  Negative for chills, fatigue and fever.  HENT:  Negative for congestion, rhinorrhea and sore throat.   Respiratory:  Negative for cough and shortness of breath.   Cardiovascular:  Negative for chest pain.  Gastrointestinal:  Negative for abdominal pain, constipation, diarrhea, nausea and vomiting.  Genitourinary:  Negative for dysuria and urgency.  Musculoskeletal:  Positive for arthralgias (left shoulder) and back pain. Negative for myalgias.  Allergic/Immunologic: Positive for environmental allergies.  Neurological:  Positive for numbness (right lower leg) and headaches. Negative for dizziness, weakness and light-headedness.  Psychiatric/Behavioral:  Negative for dysphoric mood. The patient is not nervous/anxious.      Objective:  BP (!) 148/94   Pulse 92   Temp (!) 97.3 F (36.3 C)   Resp 16   Ht _0  (1.575 m)   Wt 274 lb 3.2 oz (124.4 kg)   BMI 50.15 kg/m      06/18/2022    8:09 AM 03/14/2022    2:44 PM 12/11/2021    7:48 AM  BP/Weight  Systolic BP 299 242 683  Diastolic BP 94 78 72  Wt. (Lbs) 274.2 264 261  BMI 50.15 kg/m2 48.29 kg/m2 47.74 kg/m2    Physical Exam Constitutional:      Appearance: She is obese.  HENT:     Right Ear: Tympanic membrane normal.     Left Ear: Tympanic membrane normal.  Eyes:     Comments: Eye glasses in place  Neck:     Vascular: No carotid bruit.  Cardiovascular:     Rate and Rhythm: Normal rate and regular rhythm.     Heart sounds: Normal heart sounds. No murmur  heard. Pulmonary:     Effort: Pulmonary effort is normal.     Breath sounds: Normal breath sounds.  Abdominal:     General: Bowel sounds are normal.     Palpations: Abdomen is soft. There is no mass.     Tenderness: There is no abdominal tenderness.  Musculoskeletal:        General: No swelling.     Cervical back: No rigidity.  Skin:    Capillary Refill: Capillary refill takes less than 2 seconds.  Neurological:     General: No focal deficit present.     Mental Status: She is alert and oriented to person, place, and time.  Psychiatric:        Mood and Affect: Mood normal.        Behavior: Behavior normal.    Lab Results  Component Value Date   WBC 15.2 (H) 03/14/2022   HGB 14.3 03/14/2022   HCT 44.5 03/14/2022   PLT 357 03/14/2022   GLUCOSE 120 (H) 03/14/2022   CHOL 212 (H) 03/14/2022   TRIG 102 03/14/2022   HDL 53 03/14/2022   LDLCALC 141 (H) 03/14/2022   ALT 17 03/14/2022   AST 18 03/14/2022   NA 142 03/14/2022   K 4.7 03/14/2022   CL 105 03/14/2022   CREATININE 0.78 03/14/2022   BUN 13 03/14/2022   CO2 22 03/14/2022   TSH 2.420 09/11/2021   INR 1.53 (H) 08/06/2011   HGBA1C 5.6 03/14/2022      Assessment & Plan:   1. Hypertensive heart disease without heart failure - CBC with Differential/Platelet - Comprehensive metabolic panel - valsartan (DIOVAN) 40 MG tablet; Take 1 tablet (40 mg total) by mouth daily.  Dispense: 30 tablet; Refill: 0  2. Mixed hyperlipidemia-not at goal - Lipid panel -continue heart healthy diet -increase physical activity  3. Vitamin D insufficiency-well controlled - VITAMIN D 25 Hydroxy (Vit-D Deficiency, Fractures) -continue Vit D 50 K weekly -continue Vit D rich diet  4. Mild intermittent extrinsic asthma without complication-well controlled - CBC with Differential/Platelet - Comprehensive metabolic panel -continue Albuterol and Proventil PRN -continue Breo QD -continue Singulair 10 mg QD   Begin Valsartan 40 mg  daily Monitor BP, keep log Return in 2 weeks, bring BP log Consider RSV vaccine We will call you with lab results     Follow-up: 64-month  An After Visit Summary was printed and given to the patient.  I, SRip Harbour NP, have reviewed all documentation for this visit. The documentation on 06/18/22 for the exam, diagnosis, procedures, and orders are all accurate and complete.    Signed, SRip Harbour NP [phone]

## 2022-06-18 NOTE — Patient Instructions (Addendum)
Begin Valsartan 40 mg daily Monitor BP, keep log Return in 2 weeks, bring BP log Consider RSV vaccine We will call you with lab results   Respiratory Syncytial Virus (RSV) Vaccine Injection What is this medication? RESPIRATORY SYNCYTIAL VIRUS VACCINE (reh SPIR uh tor ee sin SISH uhl VY rus vak SEEN) reduces the risk of respiratory syncytial virus (RSV). It does not treat RSV. It is still possible to get RSV after receiving this vaccine, but the symptoms may be less severe or not last as long. It works by helping your immune system learn how to fight off a future infection. This medicine may be used for other purposes; ask your health care provider or pharmacist if you have questions. COMMON BRAND NAME(S): ABRYSVO, AREXVY What should I tell my care team before I take this medication? They need to know if you have any of these conditions: Immune system problems An unusual or allergic reaction to respiratory syncytial virus vaccine, other medications, foods, dyes, or preservatives Pregnant or trying to get pregnant Breastfeeding How should I use this medication? This vaccine is injected into a muscle. It is given by your care team. A copy of Vaccine Information Statements will be given before each vaccination. Be sure to read this information carefully each time. This sheet may change often. A copy of Vaccine Information Statements will be given before each vaccination. Be sure to read this information carefully each time. This sheet may change often. Talk to your care team about the use of this vaccine in children. It is not approved for use in children. Overdosage: If you think you have taken too much of this medicine contact a poison control center or emergency room at once. NOTE: This medicine is only for you. Do not share this medicine with others. What if I miss a dose? This does not apply. What may interact with this medication? Medications that lower your chance of fighting  infection This list may not describe all possible interactions. Give your health care provider a list of all the medicines, herbs, non-prescription drugs, or dietary supplements you use. Also tell them if you smoke, drink alcohol, or use illegal drugs. Some items may interact with your medicine. What should I watch for while using this medication? Visit your care team regularly. What side effects may I notice from receiving this medication? Side effects that you should report to your care team as soon as possible: Allergic reactions--skin rash, itching, hives, swelling of the face, lips, tongue, or throat Side effects that usually do not require medical attention (report these to your care team if they continue or are bothersome): Fatigue Feeling faint or lightheaded Headache Joint pain Muscle pain Pain, redness, or irritation at injection site This list may not describe all possible side effects. Call your doctor for medical advice about side effects. You may report side effects to FDA at 1-800-FDA-1088. Where should I keep my medication? This vaccine is only given by your care team. It will not be stored at home. NOTE: This sheet is a summary. It may not cover all possible information. If you have questions about this medicine, talk to your doctor, pharmacist, or health care provider.  2023 Elsevier/Gold Standard (2022-01-10 00:00:00)  Managing Your Hypertension Hypertension, also called high blood pressure, is when the force of the blood pressing against the walls of the arteries is too strong. Arteries are blood vessels that carry blood from your heart throughout your body. Hypertension forces the heart to work harder to pump  blood and may cause the arteries to become narrow or stiff. Understanding blood pressure readings A blood pressure reading includes a higher number over a lower number: The first, or top, number is called the systolic pressure. It is a measure of the pressure in  your arteries as your heart beats. The second, or bottom number, is called the diastolic pressure. It is a measure of the pressure in your arteries as the heart relaxes. For most people, a normal blood pressure is below 120/80. Your personal target blood pressure may vary depending on your medical conditions, your age, and other factors. Blood pressure is classified into four stages. Based on your blood pressure reading, your health care provider may use the following stages to determine what type of treatment you need, if any. Systolic pressure and diastolic pressure are measured in a unit called millimeters of mercury (mmHg). Normal Systolic pressure: below 120. Diastolic pressure: below 80. Elevated Systolic pressure: 120-129. Diastolic pressure: below 80. Hypertension stage 1 Systolic pressure: 130-139. Diastolic pressure: 80-89. Hypertension stage 2 Systolic pressure: 140 or above. Diastolic pressure: 90 or above. How can this condition affect me? Managing your hypertension is very important. Over time, hypertension can damage the arteries and decrease blood flow to parts of the body, including the brain, heart, and kidneys. Having untreated or uncontrolled hypertension can lead to: A heart attack. A stroke. A weakened blood vessel (aneurysm). Heart failure. Kidney damage. Eye damage. Memory and concentration problems. Vascular dementia. What actions can I take to manage this condition? Hypertension can be managed by making lifestyle changes and possibly by taking medicines. Your health care provider will help you make a plan to bring your blood pressure within a normal range. You may be referred for counseling on a healthy diet and physical activity. Nutrition  Eat a diet that is high in fiber and potassium, and low in salt (sodium), added sugar, and fat. An example eating plan is called the DASH diet. DASH stands for Dietary Approaches to Stop Hypertension. To eat this way: Eat  plenty of fresh fruits and vegetables. Try to fill one-half of your plate at each meal with fruits and vegetables. Eat whole grains, such as whole-wheat pasta, brown rice, or whole-grain bread. Fill about one-fourth of your plate with whole grains. Eat low-fat dairy products. Avoid fatty cuts of meat, processed or cured meats, and poultry with skin. Fill about one-fourth of your plate with lean proteins such as fish, chicken without skin, beans, eggs, and tofu. Avoid pre-made and processed foods. These tend to be higher in sodium, added sugar, and fat. Reduce your daily sodium intake. Many people with hypertension should eat less than 1,500 mg of sodium a day. Lifestyle  Work with your health care provider to maintain a healthy body weight or to lose weight. Ask what an ideal weight is for you. Get at least 30 minutes of exercise that causes your heart to beat faster (aerobic exercise) most days of the week. Activities may include walking, swimming, or biking. Include exercise to strengthen your muscles (resistance exercise), such as weight lifting, as part of your weekly exercise routine. Try to do these types of exercises for 30 minutes at least 3 days a week. Do not use any products that contain nicotine or tobacco. These products include cigarettes, chewing tobacco, and vaping devices, such as e-cigarettes. If you need help quitting, ask your health care provider. Control any long-term (chronic) conditions you have, such as high cholesterol or diabetes. Identify your sources  of stress and find ways to manage stress. This may include meditation, deep breathing, or making time for fun activities. Alcohol use Do not drink alcohol if: Your health care provider tells you not to drink. You are pregnant, may be pregnant, or are planning to become pregnant. If you drink alcohol: Limit how much you have to: 0-1 drink a day for women. 0-2 drinks a day for men. Know how much alcohol is in your drink.  In the [place] one drink equals one 12 oz bottle of beer (355 mL), one 5 oz glass of wine (148 mL), or one 1 oz glass of hard liquor (44 mL). Medicines Your health care provider may prescribe medicine if lifestyle changes are not enough to get your blood pressure under control and if: Your systolic blood pressure is 130 or higher. Your diastolic blood pressure is 80 or higher. Take medicines only as told by your health care provider. Follow the directions carefully. Blood pressure medicines must be taken as told by your health care provider. The medicine does not work as well when you skip doses. Skipping doses also puts you at risk for problems. Monitoring Before you monitor your blood pressure: Do not smoke, drink caffeinated beverages, or exercise within 30 minutes before taking a measurement. Use the bathroom and empty your bladder (urinate). Sit quietly for at least 5 minutes before taking measurements. Monitor your blood pressure at home as told by your health care provider. To do this: Sit with your back straight and supported. Place your feet flat on the floor. Do not cross your legs. Support your arm on a flat surface, such as a table. Make sure your upper arm is at heart level. Each time you measure, take two or three readings one minute apart and record the results. You may also need to have your blood pressure checked regularly by your health care provider. General information Talk with your health care provider about your diet, exercise habits, and other lifestyle factors that may be contributing to hypertension. Review all the medicines you take with your health care provider because there may be side effects or interactions. Keep all follow-up visits. Your health care provider can help you create and adjust your plan for managing your high blood pressure. Where to find more information National Heart, Lung, and Blood Institute: [employer]is American Heart Association:  www.heart.org Contact a health care provider if: You think you are having a reaction to medicines you have taken. You have repeated (recurrent) headaches. You feel dizzy. You have swelling in your ankles. You have trouble with your vision. Get help right away if: You develop a severe headache or confusion. You have unusual weakness or numbness, or you feel faint. You have severe pain in your chest or abdomen. You vomit repeatedly. You have trouble breathing. These symptoms may be an emergency. Get help right away. Call 911. Do not wait to see if the symptoms will go away. Do not drive yourself to the hospital. Summary Hypertension is when the force of blood pumping through your arteries is too strong. If this condition is not controlled, it may put you at risk for serious complications. Your personal target blood pressure may vary depending on your medical conditions, your age, and other factors. For most people, a normal blood pressure is less than 120/80. Hypertension is managed by lifestyle changes, medicines, or both. Lifestyle changes to help manage hypertension include losing weight, eating a healthy, low-sodium diet, exercising more, stopping smoking, and limiting alcohol. This  information is not intended to replace advice given to you by your health care provider. Make sure you discuss any questions you have with your health care provider. Document Revised: 04/20/2021 Document Reviewed: 04/20/2021 Elsevier Patient Education  [place].  Blood Pressure Record Sheet To take your blood pressure, you will need a blood pressure machine. You may be prescribed one, or you can buy a blood pressure machine (blood pressure monitor) at your clinic, drug store, or online. When choosing one, look for these features: An automatic monitor that has an arm cuff. A cuff that wraps snugly, but not too tightly, around your upper arm. You should be able to fit only one finger between your arm  and the cuff. A device that stores blood pressure reading results. Do not choose a monitor that measures your blood pressure from your wrist or finger. Follow your health care provider's instructions for how to take your blood pressure. To use this form: Get one reading in the morning (a.m.) before you take any medicines. Get one reading in the evening (p.m.) before supper. Take at least two readings with each blood pressure check. This makes sure the results are correct. Wait 1-2 minutes between measurements. Write down the results in the spaces on this form. Repeat this once a week, or as told by your health care provider. Make a follow-up appointment with your health care provider to discuss the results. Blood pressure log Date: _______________________ a.m. _____________________(1st reading) _____________________(2nd reading) p.m. _____________________(1st reading) _____________________(2nd reading) Date: _______________________ a.m. _____________________(1st reading) _____________________(2nd reading) p.m. _____________________(1st reading) _____________________(2nd reading) Date: _______________________ a.m. _____________________(1st reading) _____________________(2nd reading) p.m. _____________________(1st reading) _____________________(2nd reading) Date: _______________________ a.m. _____________________(1st reading) _____________________(2nd reading) p.m. _____________________(1st reading) _____________________(2nd reading) Date: _______________________ a.m. _____________________(1st reading) _____________________(2nd reading) p.m. _____________________(1st reading) _____________________(2nd reading) This information is not intended to replace advice given to you by your health care provider. Make sure you discuss any questions you have with your health care provider. Document Revised: 04/20/2021 Document Reviewed: 04/20/2021 Elsevier Patient Education  [place].

## 2022-06-19 LAB — CBC WITH DIFFERENTIAL/PLATELET
Basophils Absolute: 0.1 10*3/uL (ref 0.0–0.2)
Basos: 1 %
EOS (ABSOLUTE): 0.3 10*3/uL (ref 0.0–0.4)
Eos: 4 %
Hematocrit: 41.1 % (ref 34.0–46.6)
Hemoglobin: 13.4 g/dL (ref 11.1–15.9)
Immature Grans (Abs): 0 10*3/uL (ref 0.0–0.1)
Immature Granulocytes: 0 %
Lymphocytes Absolute: 3 10*3/uL (ref 0.7–3.1)
Lymphs: 42 %
MCH: 28.6 pg (ref 26.6–33.0)
MCHC: 32.6 g/dL (ref 31.5–35.7)
MCV: 88 fL (ref 79–97)
Monocytes Absolute: 0.5 10*3/uL (ref 0.1–0.9)
Monocytes: 7 %
Neutrophils Absolute: 3.3 10*3/uL (ref 1.4–7.0)
Neutrophils: 46 %
Platelets: 270 10*3/uL (ref 150–450)
RBC: 4.69 x10E6/uL (ref 3.77–5.28)
RDW: 13.5 % (ref 11.7–15.4)
WBC: 7.1 10*3/uL (ref 3.4–10.8)

## 2022-06-19 LAB — COMPREHENSIVE METABOLIC PANEL
ALT: 15 IU/L (ref 0–32)
AST: 18 IU/L (ref 0–40)
Albumin/Globulin Ratio: 1.7 (ref 1.2–2.2)
Albumin: 3.8 g/dL — ABNORMAL LOW (ref 3.9–4.9)
Alkaline Phosphatase: 81 IU/L (ref 44–121)
BUN/Creatinine Ratio: 19 (ref 12–28)
BUN: 14 mg/dL (ref 8–27)
Bilirubin Total: 0.8 mg/dL (ref 0.0–1.2)
CO2: 25 mmol/L (ref 20–29)
Calcium: 8.4 mg/dL — ABNORMAL LOW (ref 8.7–10.3)
Chloride: 104 mmol/L (ref 96–106)
Creatinine, Ser: 0.75 mg/dL (ref 0.57–1.00)
Globulin, Total: 2.3 g/dL (ref 1.5–4.5)
Glucose: 99 mg/dL (ref 70–99)
Potassium: 4.2 mmol/L (ref 3.5–5.2)
Sodium: 142 mmol/L (ref 134–144)
Total Protein: 6.1 g/dL (ref 6.0–8.5)
eGFR: 88 mL/min/{1.73_m2} (ref >59)

## 2022-06-19 LAB — LIPID PANEL
Chol/HDL Ratio: 3.9 ratio (ref 0.0–4.4)
Cholesterol, Total: 163 mg/dL (ref 100–199)
HDL: 42 mg/dL (ref >39)
LDL Chol Calc (NIH): 96 mg/dL (ref 0–99)
Triglycerides: 142 mg/dL (ref 0–149)
VLDL Cholesterol Cal: 25 mg/dL (ref 5–40)

## 2022-06-19 LAB — VITAMIN D 25 HYDROXY (VIT D DEFICIENCY, FRACTURES): Vit D, 25-Hydroxy: 26.3 ng/mL — ABNORMAL LOW (ref 30.0–100.0)

## 2022-06-19 LAB — CARDIOVASCULAR RISK ASSESSMENT

## 2022-06-27 ENCOUNTER — Other Ambulatory Visit: Payer: Self-pay | Admitting: Nurse Practitioner

## 2022-07-02 ENCOUNTER — Ambulatory Visit (INDEPENDENT_AMBULATORY_CARE_PROVIDER_SITE_OTHER): Payer: Medicare Other | Admitting: Nurse Practitioner

## 2022-07-02 ENCOUNTER — Other Ambulatory Visit: Payer: Self-pay | Admitting: Nurse Practitioner

## 2022-07-02 ENCOUNTER — Encounter: Payer: Self-pay | Admitting: Nurse Practitioner

## 2022-07-02 VITALS — BP 136/88 | HR 90 | Temp 97.2°F | Ht 61.0 in | Wt 275.0 lb

## 2022-07-02 DIAGNOSIS — G5791 Unspecified mononeuropathy of right lower limb: Secondary | ICD-10-CM

## 2022-07-02 DIAGNOSIS — E559 Vitamin D deficiency, unspecified: Secondary | ICD-10-CM

## 2022-07-02 DIAGNOSIS — I119 Hypertensive heart disease without heart failure: Secondary | ICD-10-CM

## 2022-07-02 MED ORDER — VITAMIN D (ERGOCALCIFEROL) 1.25 MG (50000 UNIT) PO CAPS: 50000.0000 [IU] | ORAL_CAPSULE | ORAL | 1 refills | Status: DC

## 2022-07-02 MED ORDER — GABAPENTIN 300 MG PO CAPS: 300.0000 mg | ORAL_CAPSULE | Freq: Three times a day (TID) | ORAL | 1 refills | Status: DC

## 2022-07-02 MED ORDER — VALSARTAN 40 MG PO TABS: 40.0000 mg | ORAL_TABLET | Freq: Every day | ORAL | 1 refills | Status: DC

## 2022-07-02 NOTE — Progress Notes (Signed)
Subjective:  Patient ID: Rebecca Rojas, adult    DOB: Apr 29, 1956  Age: 66 y.o. MRN: [id number]  Chief Complaint  Patient presents with   Hypertension    HPI  Pt presents for follow-up of hypertension. She was found to have continued Vit D deficiency per labs two weeks ago. She is taking Vit D 50 K weekly. Hypertension, follow-up: She was last seen for hypertension 2 weeks ago.  BP at that visit was 140/90. Management includes Valsartan 40 mg daily.  She reports excellent compliance with treatment. She is not having side effects.  She is following a Regular diet. She is not exercising. She does not smoke.  Use of agents associated with hypertension: none.   Outside blood pressures are 129/86 HR 93, 136/81 HR 74,132/79 HR 82, 112/63 HR 93, 90/56 HR 76, 128/71 HR 93, 113/60 HR 89, 136/67 HR 85, 133/72 HR 86, 115/65 HR 90, 133/78 HR 94, 117/70 HR 92, 130/78 HR 93, 119/72 HR 83, 120/72 HR 84, 118/60 HR 61  Pertinent labs: Lab Results  Component Value Date   CHOL 163 06/18/2022   HDL 42 06/18/2022   LDLCALC 96 06/18/2022   TRIG 142 06/18/2022   CHOLHDL 3.9 06/18/2022   Lab Results  Component Value Date   NA 142 06/18/2022   K 4.2 06/18/2022   CREATININE 0.75 06/18/2022   EGFR 88 06/18/2022   GFRNONAA 93 06/13/2020   GLUCOSE 99 06/18/2022     The 10-year ASCVD risk score (Arnett DK, et al., 2019) is: 9.3%   Current Outpatient Medications on File Prior to Visit  Medication Sig Dispense Refill   albuterol (PROVENTIL HFA;VENTOLIN HFA) 108 (90 BASE) MCG/ACT inhaler Inhale 1-2 puffs into the lungs every 6 (six) hours as needed for wheezing or shortness of breath.      aspirin EC 81 MG tablet Take 81 mg by mouth daily. Swallow whole.     azelastine (OPTIVAR) 0.05 % ophthalmic solution Place 1 drop into both eyes 2 (two) times daily as needed for allergies (allergies).     BREO ELLIPTA 200-25 MCG/ACT AEPB Inhale 1 puff into the lungs daily.     cyclobenzaprine (FLEXERIL) 10 MG  tablet Take 10 mg by mouth 3 (three) times daily as needed.     EPINEPHrine 0.3 mg/0.3 mL IJ SOAJ injection Inject 0.3 mg into the muscle as needed for anaphylaxis.     etodolac (LODINE) 400 MG tablet Take 400 mg by mouth 2 (two) times daily.     gabapentin (NEURONTIN) 300 MG capsule Take 300 mg by mouth 3 (three) times daily.     ipratropium (ATROVENT) 0.02 % nebulizer solution Take 3 mLs by nebulization every 6 (six) hours as needed for wheezing or shortness of breath.     levocetirizine (XYZAL) 5 MG tablet Take 5 mg by mouth every evening.     metoprolol succinate (TOPROL-XL) 25 MG 24 hr tablet TAKE 1 TABLET (25 MG TOTAL) BY MOUTH DAILY. 90 tablet 3   montelukast (SINGULAIR) 10 MG tablet Take 10 mg by mouth at bedtime.     valsartan (DIOVAN) 40 MG tablet Take 1 tablet (40 mg total) by mouth daily. 30 tablet 0   Vitamin D, Ergocalciferol, (DRISDOL) 1.25 MG (50000 UNIT) CAPS capsule TAKE 1 CAPSULE (50,000 UNITS TOTAL) BY MOUTH EVERY 7 (SEVEN) DAYS 12 capsule 1   No current facility-administered medications on file prior to visit.   Past Medical History:  Diagnosis Date   Acute laryngopharyngitis 01/13/2020  Acute non-recurrent frontal sinusitis 09/22/2017   Allergic rhinitis 02/15/2021   Allergic rhinitis due to animal (cat) (dog) hair and dander 02/15/2021   Allergic rhinitis due to pollen 02/15/2021   Allergy    dust, environmental, feathers, has epipen, prn   Allergy-induced asthma    Anemia    Annual physical exam 02/15/2021   Arthritis    osteoarthritis   Arthritis of right acromioclavicular joint 06/15/2019   Asthma 12/28/2015   Asthma with exacerbation 04/27/2020   Atrial fibrillation ([place]) 05/20/2020   Atrial fibrillation with RVR ([place]) 05/20/2020   Back pain    Benign essential hypertension 12/28/2015   Bilateral lower extremity edema 08/04/2018   Chronic allergic conjunctivitis 02/15/2021   Encounter for screening colonoscopy 01/08/2017   Encounter for screening mammogram  for breast cancer 02/15/2021   Glenoid labral tear, right, initial encounter 06/15/2019   Headache    Healthcare maintenance 01/08/2017   High risk medication use 12/28/2015   History of seasonal allergies    Hordeolum internum of right lower eyelid 02/14/2017   Hypertensive heart disease 06/02/2020   Insulin resistance 10/01/2019   Joint pain    Lower extremity edema    Lower respiratory infection (e.g., bronchitis, pneumonia, pneumonitis, pulmonitis) 05/10/2009   Qualifier: Diagnosis of  By: Gwenette Greet MD, Armando Reichert    Migraine headache 12/28/2015   Mild persistent asthma, uncomplicated 0/30/0923   Mixed hyperlipidemia 12/28/2015   Morbid obesity ([place])    Myalgia 12/28/2015   Need for shingles vaccine 02/15/2021   Osteoarthritis    Other chest pain 02/15/2021   Other specified menopausal and perimenopausal disorders 02/15/2021   Pain of right eye 02/14/2017   Pain of right foot 08/30/2016   Persistent cough for 3 weeks or longer 10/11/2017   Pharyngitis 05/23/2018   Rotator cuff tear, right 06/15/2019   Shortness of breath    maybe with exertion   Skin lesion of foot 08/30/2016   Sleep apnea    uses cpap   Sleep apnea with use of continuous positive airway pressure (CPAP) 02/15/2021   Sore throat 05/11/2018   Unspecified open wound, left lower leg, initial encounter 08/04/2018   Vertigo    Vitamin D deficiency    Past Surgical History:  Procedure Laterality Date   ABDOMINAL HYSTERECTOMY     COLONOSCOPY  09/2010   normal   FRACTURE SURGERY     left 5th finger fx with bone graft repair   HEEL SPUR EXCISION     JOINT REPLACEMENT     KNEE ARTHROSCOPY Left    Left Knee lateral release     LIPOMA EXCISION     SHOULDER ARTHROSCOPY WITH ROTATOR CUFF REPAIR AND SUBACROMIAL DECOMPRESSION Right 06/15/2019   Procedure: SHOULDER ARTHROSCOPY WITH ROTATOR CUFF REPAIR AND SUBACROMIAL DECOMPRESSION;  Surgeon: Dorna Leitz, MD;  Location: [facility];  Service: Orthopedics;  Laterality: Right;    SVT ABLATION N/A 12/06/2020   Procedure: SVT ABLATION;  Surgeon: Constance Haw, MD;  Location: [facility];  Service: Cardiovascular;  Laterality: N/A;   TONSILLECTOMY     TOTAL KNEE ARTHROPLASTY  08/03/2011   Procedure: TOTAL KNEE ARTHROPLASTY;  Surgeon: Alta Corning;  Location: [facility];  Service: Orthopedics;  Laterality: Right;  COMPUTER ASSISTED TOTAL KNEE REPLACEMENTwith revision tibial component   TUBAL LIGATION      Family History  Problem Relation Age of Onset   Hypertension Mother    Heart disease Mother    Heart attack Mother    Hyperlipidemia  Mother    Stroke Mother    Obesity Mother    Cancer Father    Liver disease Father    Obesity Father    Anesthesia problems Neg Hx    Hypotension Neg Hx    Malignant hyperthermia Neg Hx    Pseudochol deficiency Neg Hx    Colon cancer Neg Hx    Colon polyps Neg Hx    Esophageal cancer Neg Hx    Rectal cancer Neg Hx    Stomach cancer Neg Hx    Breast cancer Neg Hx    Social History   Socioeconomic History   Marital status: Married    Spouse name: Nabiha Planck   Number of children: 2   Years of education: Not on file   Highest education level: Not on file  Occupational History   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 [employer]  Tobacco Use   Smoking status: Never    Passive exposure: Never   Smokeless tobacco: Never  Vaping Use   Vaping Use: Never used  Substance and Sexual Activity   Alcohol use: Not Currently   Drug use: Never   Sexual activity: Yes  Other Topics Concern   Not on file  Social History Narrative   Not on file   Social Determinants of Health   Financial Resource Strain: Low Risk  (07/02/2022)   Overall Financial Resource Strain (CARDIA)    Difficulty of Paying Living Expenses: Not hard at all  Food Insecurity: No Food Insecurity (07/02/2022)   Hunger Vital Sign    Worried About Running Out of Food in the Last Year: Never true    Ran Out of Food in the Last Year: Never true  Transportation  Needs: No Transportation Needs (07/02/2022)   PRAPARE - Hydrologist (Medical): No    Lack of Transportation (Non-Medical): No  Physical Activity: Inactive (07/02/2022)   Exercise Vital Sign    Days of Exercise per Week: 0 days    Minutes of Exercise per Session: 0 min  Stress: No Stress Concern Present (07/02/2022)   [place]    Feeling of Stress : Not at all  Social Connections: Moderately Integrated (07/02/2022)   Social Connection and Isolation Panel [NHANES]    Frequency of Communication with Friends and Family: More than three times a week    Frequency of Social Gatherings with Friends and Family: More than three times a week    Attends Religious Services: More than 4 times per year    Active Member of [employer] or Organizations: No    Attends Archivist Meetings: Never    Marital Status: Married    Review of Systems  Constitutional:  Negative for chills, diaphoresis, fatigue and fever.  HENT:  Negative for congestion, ear pain and sore throat.   Respiratory:  Negative for cough and shortness of breath.   Cardiovascular:  Negative for chest pain and leg swelling.  Gastrointestinal:  Negative for abdominal pain, constipation, diarrhea, nausea and vomiting.  Genitourinary:  Negative for dysuria and urgency.  Musculoskeletal:  Negative for arthralgias and myalgias.  Neurological:  Negative for dizziness and headaches.  Psychiatric/Behavioral:  Negative for dysphoric mood.      Objective:  BP 136/88   Pulse 90   Temp (!) 97.2 F (36.2 C)   Ht _0  (1.549 m)   Wt 275 lb (124.7 kg)   SpO2 99%   BMI 51.96 kg/m  07/02/2022   10:17 AM 06/18/2022    8:48 AM 06/18/2022    8:09 AM  BP/Weight  Systolic BP 654 650 354  Diastolic BP 88 90 94  Wt. (Lbs) 275  274.2  BMI 51.96 kg/m2  50.15 kg/m2    Physical Exam Vitals reviewed.  Constitutional:      Appearance:  Normal appearance.  Cardiovascular:     Rate and Rhythm: Normal rate and regular rhythm.     Pulses: Normal pulses.     Heart sounds: Normal heart sounds.  Pulmonary:     Effort: Pulmonary effort is normal.     Breath sounds: Normal breath sounds.  Skin:    General: Skin is warm and dry.     Capillary Refill: Capillary refill takes less than 2 seconds.  Neurological:     Mental Status: She is alert.       Lab Results  Component Value Date   WBC 7.1 06/18/2022   HGB 13.4 06/18/2022   HCT 41.1 06/18/2022   PLT 270 06/18/2022   GLUCOSE 99 06/18/2022   CHOL 163 06/18/2022   TRIG 142 06/18/2022   HDL 42 06/18/2022   LDLCALC 96 06/18/2022   ALT 15 06/18/2022   AST 18 06/18/2022   NA 142 06/18/2022   K 4.2 06/18/2022   CL 104 06/18/2022   CREATININE 0.75 06/18/2022   BUN 14 06/18/2022   CO2 25 06/18/2022   TSH 2.420 09/11/2021   INR 1.53 (H) 08/06/2011   HGBA1C 5.6 03/14/2022      Assessment & Plan:   1. Hypertensive heart disease without heart failure-well controlled -continue Diovan 40 mg QD and Metoprolol 25 mg QD    Continue medications Follow-up in 1-month, fasting  Follow-up: 355-month An After Visit Summary was printed and given to the patient.  I, ShRip HarbourNP, have reviewed all documentation for this visit. The documentation on 07/02/22 for the exam, diagnosis, procedures, and orders are all accurate and complete.   Signed, ShRip HarbourNP [phone]

## 2022-07-02 NOTE — Patient Instructions (Signed)
Continue medications Follow-up in 21-months, fasting  Preventing Vitamin D Deficiency Vitamin D deficiency is when your body does not have enough vitamin D. Vitamin D is important because it helps your body maintain calcium and phosphorus levels. It plays a key role in the health of bones and teeth, reduces inflammation, and improves the body's defense system (immune system). Our bodies make vitamin D when our skin is exposed to direct sunlight. However, for many people, this may not be enough vitamin D to meet the body's needs. How can this condition affect me? If vitamin D deficiency is severe, it can cause a condition in which a person's bones become softer than normal. In adults, this condition is called osteomalacia. In children, this condition is called rickets. Vitamin D deficiency can also cause weak or thin bones (osteoporosis) in adults. What can increase my risk? You may be at risk for a vitamin D deficiency if you: Are pregnant. Are obese. Are an older adult. Have dark skin. Take certain medicines that affect the way vitamin D is absorbed. Have had a surgery in which a part of the stomach or a part of the small intestine was removed. Other risk factors include: Having a condition that limits your ability to absorb fat, such as Crohn's disease, long-term (chronic) pancreatitis, or cystic fibrosis. Having certain conditions that are passed from parent to child (inherited). Not having access to foods rich in vitamin D. Having limited ability to move and go outside safely. Living in areas that have fewer hours of sunlight. Spending most of your day indoors, or covering your skin all the time when you are outdoors. What actions can I take to reduce my risk of a vitamin D deficiency? Knowing the best sources of vitamin D You can meet your daily vitamin D needs from: Foods. Dietary supplements. Direct exposure to natural sunlight. Infant formula, for infants. Knowing how much  vitamin D you need General recommendations for daily vitamin D intake vary by these categories: Infants: 400 international units (IU). Children older than 1 year: 600 international units. Adults: 600 international units. Pregnant and breastfeeding women: 600 international units. Adults older than 70 years: 800 international units. These are minimum levels of recommended amounts. Your health care provider may recommend a different amount of vitamin D intake based on your specific needs and your overall health. Getting sun exposure Get regular, safe exposure to natural sunlight. Expose your skin to direct sunlight for at least 15 minutes every day. If you have dark skin, you may need to expose your skin for a longer period of time. Protect your skin from too much sun exposure. This helps to prevent skin cancer. Ask your health care provider if regular sun exposure is safe for you. Do not use a tanning bed. Eating and drinking  Eat foods that naturally contain vitamin D. These include: Beef liver. Eggs. The vitamin D is in the yolk. Fish, such as salmon or trout. Mushrooms that were treated with UV light. Eat or drink products that have vitamin D added to them (are fortified). These may include: Cereals. Milk, including plant-based alternatives such as almond, soy, or oat milks. Orange juice. Margarine. When choosing foods, check the food label on the package to see: How much vitamin D is in the item. If the food is fortified with vitamin D. The items listed above may not be a complete list of foods and beverages you can eat and drink. Contact a dietitian for more information. Taking supplements and medicines  If you are at risk for vitamin D deficiency, or if you have certain diseases, your health care provider may recommend that you take a vitamin D supplement. Make sure you: Talk with your health care provider before you start taking any vitamin D supplements. You may be more sensitive  to the side effects of vitamin D supplements if you are on certain medicines or have certain medical conditions. Tell your health care provider about all medicines you are taking, including vitamins, herbs, eye drops, creams, and over-the-counter medicines. Take over-the-counter and prescription medicines only as told by your health care provider. Take supplements only as told by your health care provider. To increase absorption of your supplement, take it with a meal or snack. Summary Vitamin D plays a key role in the health of bones and teeth, reduces inflammation, and improves the body's defense system (immune system). A vitamin D deficiency can put you at risk of developing conditions such as rickets or osteoporosis. Our bodies make vitamin D when our skin is exposed to direct sunlight. However, for many people, this may not be enough vitamin D to meet the body's needs. Some foods naturally contain vitamin D, including beef liver, egg yolk, and fish. Eat or drink products that have vitamin D added to them (are fortified). This information is not intended to replace advice given to you by your health care provider. Make sure you discuss any questions you have with your health care provider. Document Revised: 05/12/2021 Document Reviewed: 05/12/2021 Elsevier Patient Education  [place].

## 2022-09-26 ENCOUNTER — Other Ambulatory Visit: Payer: Self-pay | Admitting: Neurosurgery

## 2022-09-26 DIAGNOSIS — R222 Localized swelling, mass and lump, trunk: Secondary | ICD-10-CM

## 2022-10-02 ENCOUNTER — Ambulatory Visit
Admission: RE | Admit: 2022-10-02 | Discharge: 2022-10-02 | Disposition: A | Discharge status: Home / Self Care | Payer: Medicare Other | Source: Ambulatory Visit | Attending: Neurosurgery | Admitting: Neurosurgery

## 2022-10-02 ENCOUNTER — Other Ambulatory Visit: Payer: Self-pay | Admitting: Neurosurgery

## 2022-10-02 DIAGNOSIS — R222 Localized swelling, mass and lump, trunk: Secondary | ICD-10-CM

## 2022-10-10 ENCOUNTER — Encounter: Payer: Self-pay | Admitting: Nurse Practitioner

## 2022-10-10 ENCOUNTER — Ambulatory Visit (INDEPENDENT_AMBULATORY_CARE_PROVIDER_SITE_OTHER): Payer: Medicare Other | Admitting: Nurse Practitioner

## 2022-10-10 VITALS — BP 140/80 | HR 72 | Temp 97.3°F | Resp 18 | Ht 61.0 in | Wt 275.0 lb

## 2022-10-10 DIAGNOSIS — E782 Mixed hyperlipidemia: Secondary | ICD-10-CM | POA: Diagnosis not present

## 2022-10-10 DIAGNOSIS — I10 Essential (primary) hypertension: Secondary | ICD-10-CM | POA: Diagnosis not present

## 2022-10-10 DIAGNOSIS — J4530 Mild persistent asthma, uncomplicated: Secondary | ICD-10-CM

## 2022-10-10 DIAGNOSIS — E559 Vitamin D deficiency, unspecified: Secondary | ICD-10-CM | POA: Diagnosis not present

## 2022-10-10 DIAGNOSIS — I4811 Longstanding persistent atrial fibrillation: Secondary | ICD-10-CM

## 2022-10-10 MED ORDER — VALSARTAN-HYDROCHLOROTHIAZIDE 160-12.5 MG PO TABS: 1.0000 | ORAL_TABLET | Freq: Every day | ORAL | 1 refills | Status: DC

## 2022-10-10 MED ORDER — SEMAGLUTIDE-WEIGHT MANAGEMENT 0.25 MG/0.5ML ~~LOC~~ SOAJ: 0.2500 mg | SUBCUTANEOUS | 0 refills | Status: AC

## 2022-10-10 NOTE — Progress Notes (Addendum)
Subjective:  Patient ID: Rebecca Rojas, adult    DOB: November 16, 1955  Age: 67 y.o. MRN: [id number]  Chief Complaint  Patient presents with   Hyperlipidemia   Hypertension     History of Present illness:  Patient is here for a regular follow up for her hypertension, hyperlipidemia,  and Asthma. Overall patient does not have any specific complain. But she wants to restart wegovy, she used to be on that medicine and later due to insurance coverage problems she had to stopped in between. She wants to restart wegovy back. She has a h/o a-fib with cardiac ablation which she follows to cardiology regularly.  She also shared that Diovan 40 mg is not controlling her blood pressure and she still runs like 140-150's and 80-90's.  She denies headache, dizziness, SHORTNESS OF BREATH,chest pain and distress.  For HYPERTENSION:   She was last seen for hypertension 3 months ago.  BP at that visit was 136/88. Management includes Diovan 40 mg daily, metoprolol 25 mg daily. Follow up with Dr. Curt Bears next month, patient had a Ablation for A-fib with RVR, now she is running SR, patient keeps track of her EKG rhythm through her phone.  She reports good compliance with treatment. She is not having side effects. She is following a Regular diet. She is not exercising. She does not smoke.  Use of agents associated with hypertension: none.   Outside blood pressures are 140's/80's  Pertinent labs: Lab Results  Component Value Date   CHOL 163 06/18/2022   HDL 42 06/18/2022   LDLCALC 96 06/18/2022   TRIG 142 06/18/2022   CHOLHDL 3.9 06/18/2022   Lab Results  Component Value Date   NA 142 06/18/2022   K 4.2 06/18/2022   CREATININE 0.75 06/18/2022   EGFR 88 06/18/2022   GFRNONAA 93 06/13/2020   GLUCOSE 99 06/18/2022     The 10-year ASCVD risk score (Arnett DK, et al., 2019) is: 9.9%     Vitamin D deficiency, follow-up  Lab Results  Component Value Date   VD25OH 26.3 (L) 06/18/2022   VD25OH  22.4 (L) 03/14/2022   VD25OH 31.0 09/11/2021   CALCIUM 8.4 (L) 06/18/2022   CALCIUM 9.1 03/14/2022  ) Wt Readings from Last 3 Encounters:  10/10/22 275 lb (124.7 kg)  07/02/22 275 lb (124.7 kg)  06/18/22 274 lb 3.2 oz (124.4 kg)    She was last seen for vitamin D deficiency 3 months ago.  Management since that visit includes vitamin D, 50,000 units every 7 days She reports excellent compliance with treatment. She is not having side effects.    Asthma, Follow up  She was last seen for this 3 months ago. Current management includes. Albuterol and atorvent as needed   She reports good compliance with treatment. She is not having side effects.  she uses rescue inhaler 1 per  month . She is NOT experiencing any side effects she reports breathing is Improved.   ---------------------------------------------------------------------------------------------------   Current Outpatient Medications on File Prior to Visit  Medication Sig Dispense Refill   albuterol (PROVENTIL HFA;VENTOLIN HFA) 108 (90 BASE) MCG/ACT inhaler Inhale 1-2 puffs into the lungs every 6 (six) hours as needed for wheezing or shortness of breath.      aspirin EC 81 MG tablet Take 81 mg by mouth daily. Swallow whole.     azelastine (OPTIVAR) 0.05 % ophthalmic solution Place 1 drop into both eyes 2 (two) times daily as needed for allergies (allergies).  cyclobenzaprine (FLEXERIL) 10 MG tablet Take 10 mg by mouth 3 (three) times daily as needed.     EPINEPHrine 0.3 mg/0.3 mL IJ SOAJ injection Inject 0.3 mg into the muscle as needed for anaphylaxis.     etodolac (LODINE) 400 MG tablet Take 400 mg by mouth 2 (two) times daily.     gabapentin (NEURONTIN) 300 MG capsule Take 1 capsule (300 mg total) by mouth 3 (three) times daily. 180 capsule 1   ipratropium (ATROVENT) 0.02 % nebulizer solution Take 3 mLs by nebulization every 6 (six) hours as needed for wheezing or shortness of breath.     levocetirizine (XYZAL) 5 MG  tablet Take 5 mg by mouth every evening.     metoprolol succinate (TOPROL-XL) 25 MG 24 hr tablet TAKE 1 TABLET (25 MG TOTAL) BY MOUTH DAILY. 90 tablet 3   montelukast (SINGULAIR) 10 MG tablet Take 10 mg by mouth at bedtime.     Vitamin D, Ergocalciferol, (DRISDOL) 1.25 MG (50000 UNIT) CAPS capsule Take 1 capsule (50,000 Units total) by mouth every 7 (seven) days. 12 capsule 1   No current facility-administered medications on file prior to visit.   Past Medical History:  Diagnosis Date   Acute laryngopharyngitis 01/13/2020   Acute non-recurrent frontal sinusitis 09/22/2017   Allergic rhinitis 02/15/2021   Allergic rhinitis due to animal (cat) (dog) hair and dander 02/15/2021   Allergic rhinitis due to pollen 02/15/2021   Allergy    dust, environmental, feathers, has epipen, prn   Allergy-induced asthma    Anemia    Annual physical exam 02/15/2021   Arthritis    osteoarthritis   Arthritis of right acromioclavicular joint 06/15/2019   Asthma 12/28/2015   Asthma with exacerbation 04/27/2020   Atrial fibrillation ([place]) 05/20/2020   Atrial fibrillation with RVR ([place]) 05/20/2020   Back pain    Benign essential hypertension 12/28/2015   Bilateral lower extremity edema 08/04/2018   Chronic allergic conjunctivitis 02/15/2021   Encounter for screening colonoscopy 01/08/2017   Encounter for screening mammogram for breast cancer 02/15/2021   Glenoid labral tear, right, initial encounter 06/15/2019   Headache    Healthcare maintenance 01/08/2017   High risk medication use 12/28/2015   History of seasonal allergies    Hordeolum internum of right lower eyelid 02/14/2017   Hypertensive heart disease 06/02/2020   Insulin resistance 10/01/2019   Joint pain    Lower extremity edema    Lower respiratory infection (e.g., bronchitis, pneumonia, pneumonitis, pulmonitis) 05/10/2009   Qualifier: Diagnosis of  By: Gwenette Greet MD, Armando Reichert    Migraine headache 12/28/2015   Mild persistent asthma, uncomplicated  [id number]   Mixed hyperlipidemia 12/28/2015   Morbid obesity ([place])    Myalgia 12/28/2015   Need for shingles vaccine 02/15/2021   Osteoarthritis    Other chest pain 02/15/2021   Other specified menopausal and perimenopausal disorders 02/15/2021   Pain of right eye 02/14/2017   Pain of right foot 08/30/2016   Persistent cough for 3 weeks or longer 10/11/2017   Pharyngitis 05/23/2018   Rotator cuff tear, right 06/15/2019   Shortness of breath    maybe with exertion   Skin lesion of foot 08/30/2016   Sleep apnea    uses cpap   Sleep apnea with use of continuous positive airway pressure (CPAP) 02/15/2021   Sore throat 05/11/2018   Unspecified open wound, left lower leg, initial encounter 08/04/2018   Vertigo    Vitamin D deficiency    Past Surgical History:  Procedure  Laterality Date   ABDOMINAL HYSTERECTOMY     COLONOSCOPY  09/2010   normal   FRACTURE SURGERY     left 5th finger fx with bone graft repair   HEEL SPUR EXCISION     JOINT REPLACEMENT     KNEE ARTHROSCOPY Left    Left Knee lateral release     LIPOMA EXCISION     SHOULDER ARTHROSCOPY WITH ROTATOR CUFF REPAIR AND SUBACROMIAL DECOMPRESSION Right 06/15/2019   Procedure: SHOULDER ARTHROSCOPY WITH ROTATOR CUFF REPAIR AND SUBACROMIAL DECOMPRESSION;  Surgeon: Dorna Leitz, MD;  Location: [facility];  Service: Orthopedics;  Laterality: Right;   SVT ABLATION N/A 12/06/2020   Procedure: SVT ABLATION;  Surgeon: Constance Haw, MD;  Location: [facility];  Service: Cardiovascular;  Laterality: N/A;   TONSILLECTOMY     TOTAL KNEE ARTHROPLASTY  08/03/2011   Procedure: TOTAL KNEE ARTHROPLASTY;  Surgeon: Alta Corning;  Location: [facility];  Service: Orthopedics;  Laterality: Right;  COMPUTER ASSISTED TOTAL KNEE REPLACEMENTwith revision tibial component   TUBAL LIGATION      Family History  Problem Relation Age of Onset   Hypertension Mother    Heart disease Mother    Heart attack Mother    Hyperlipidemia Mother    Stroke  Mother    Obesity Mother    Cancer Father    Liver disease Father    Obesity Father    Anesthesia problems Neg Hx    Hypotension Neg Hx    Malignant hyperthermia Neg Hx    Pseudochol deficiency Neg Hx    Colon cancer Neg Hx    Colon polyps Neg Hx    Esophageal cancer Neg Hx    Rectal cancer Neg Hx    Stomach cancer Neg Hx    Breast cancer Neg Hx    Social History   Socioeconomic History   Marital status: Married    Spouse name: Marisabel Mitrovic   Number of children: 2   Years of education: Not on file   Highest education level: Not on file  Occupational History   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 [employer]  Tobacco Use   Smoking status: Never    Passive exposure: Never   Smokeless tobacco: Never  Vaping Use   Vaping Use: Never used  Substance and Sexual Activity   Alcohol use: Not Currently   Drug use: Never   Sexual activity: Yes  Other Topics Concern   Not on file  Social History Narrative   Not on file   Social Determinants of Health   Financial Resource Strain: Low Risk  (07/02/2022)   Overall Financial Resource Strain (CARDIA)    Difficulty of Paying Living Expenses: Not hard at all  Food Insecurity: No Food Insecurity (07/02/2022)   Hunger Vital Sign    Worried About Running Out of Food in the Last Year: Never true    Ran Out of Food in the Last Year: Never true  Transportation Needs: No Transportation Needs (07/02/2022)   PRAPARE - Hydrologist (Medical): No    Lack of Transportation (Non-Medical): No  Physical Activity: Inactive (07/02/2022)   Exercise Vital Sign    Days of Exercise per Week: 0 days    Minutes of Exercise per Session: 0 min  Stress: No Stress Concern Present (07/02/2022)   [place]    Feeling of Stress : Not at all  Social Connections: Moderately Integrated (07/02/2022)   Social Connection  and Isolation Panel [NHANES]    Frequency of  Communication with Friends and Family: More than three times a week    Frequency of Social Gatherings with Friends and Family: More than three times a week    Attends Religious Services: More than 4 times per year    Active Member of [employer] or Organizations: No    Attends Archivist Meetings: Never    Marital Status: Married    Review of Systems  Constitutional:  Negative for chills, fatigue and fever.  HENT:  Negative for congestion, ear pain and sore throat.   Respiratory:  Negative for cough and shortness of breath.   Cardiovascular:  Negative for chest pain and palpitations.  Gastrointestinal:  Negative for abdominal pain, constipation, diarrhea, nausea and vomiting.  Endocrine: Negative for polydipsia, polyphagia and polyuria.  Genitourinary:  Negative for dysuria and frequency.  Musculoskeletal:  Negative for arthralgias and back pain.  Neurological:  Negative for dizziness and headaches.  Psychiatric/Behavioral:  Negative for dysphoric mood. The patient is not nervous/anxious.      Objective:  BP (!) 140/80 (BP Location: Right Arm)   Pulse 72   Temp (!) 97.3 F (36.3 C)   Resp 18   Ht 5' 1"$  (1.549 m)   Wt 275 lb (124.7 kg)   SpO2 97%   BMI 51.96 kg/m      10/10/2022   10:37 AM 10/10/2022    7:56 AM 07/02/2022   10:17 AM  BP/Weight  Systolic BP [id number]  Diastolic BP 80 90 88  Wt. (Lbs)  275 275  BMI  51.96 kg/m2 51.96 kg/m2    Physical Exam Vitals reviewed.  Constitutional:      Appearance: Normal appearance. She is obese.  Neck:     Vascular: No carotid bruit.  Cardiovascular:     Rate and Rhythm: Normal rate and regular rhythm.     Pulses: Normal pulses.     Heart sounds: Normal heart sounds.  Pulmonary:     Effort: Pulmonary effort is normal.     Breath sounds: Normal breath sounds.  Abdominal:     General: Abdomen is flat. Bowel sounds are normal.     Palpations: Abdomen is soft.     Tenderness: There is no abdominal tenderness.   Musculoskeletal:     Cervical back: Normal range of motion.  Neurological:     Mental Status: She is alert and oriented to person, place, and time.  Psychiatric:        Mood and Affect: Mood normal.        Behavior: Behavior normal.        10/10/2022    9:52 AM 06/18/2022    8:15 AM 09/11/2021    7:55 AM 11/15/2020    2:59 PM 09/01/2019    8:35 AM  Depression screen PHQ 2/9  Decreased Interest 0 0 0 0 2  Down, Depressed, Hopeless 0 0 0 0 1  PHQ - 2 Score 0 0 0 0 3  Altered sleeping     1  Tired, decreased energy     3  Change in appetite     3  Feeling bad or failure about yourself      2  Trouble concentrating     2  Moving slowly or fidgety/restless     1  Suicidal thoughts     0  PHQ-9 Score     15  Difficult doing work/chores     Somewhat difficult  07/17/2021   10:48 AM 09/11/2021    7:55 AM 06/18/2022    8:15 AM 07/02/2022   10:30 AM 10/10/2022    9:52 AM  Fall Risk  Falls in the past year? 0 0 0 0 0  Was there an injury with Fall? 0 0 0 0 0  Fall Risk Category Calculator 0 0 0 0 0  Fall Risk Category (Retired) Low Low Low Low   (RETIRED) Patient Fall Risk Level Low fall risk Low fall risk Low fall risk Low fall risk   Patient at Risk for Falls Due to No Fall Risks No Fall Risks No Fall Risks No Fall Risks   Fall risk Follow up Falls evaluation completed Falls evaluation completed Falls evaluation completed Falls evaluation completed     Lab Results  Component Value Date   WBC 7.1 06/18/2022   HGB 13.4 06/18/2022   HCT 41.1 06/18/2022   PLT 270 06/18/2022   GLUCOSE 99 06/18/2022   CHOL 163 06/18/2022   TRIG 142 06/18/2022   HDL 42 06/18/2022   LDLCALC 96 06/18/2022   ALT 15 06/18/2022   AST 18 06/18/2022   NA 142 06/18/2022   K 4.2 06/18/2022   CL 104 06/18/2022   CREATININE 0.75 06/18/2022   BUN 14 06/18/2022   CO2 25 06/18/2022   TSH 2.420 09/11/2021   INR 1.53 (H) 08/06/2011   HGBA1C 5.6 03/14/2022      Assessment & Plan:   Benign  essential hypertension Assessment & Plan: Not controlled Home BP 140's/80-90's  Changed Diovan to Diovan-HCT Will follow up as needed  Nutrition: Stressed importance of moderation in sodium intake, saturated fat and cholesterol, caloric balance, sufficient intake of complex carbohydrates, fiber, calcium and iron.   Exercise: Stressed the importance of regular exercise.     Orders: -     CBC with Differential/Platelet -     Comprehensive metabolic panel -     TSH -     Lipid panel -     Valsartan-hydroCHLOROthiazide; Take 1 tablet by mouth daily.  Dispense: 90 tablet; Refill: 1  Mixed hyperlipidemia Assessment & Plan: Lipid profile will be checked   Nutrition: Stressed importance of moderation in sodium intake, saturated fat and cholesterol, caloric balance, sufficient intake of complex carbohydrates, fiber, calcium and iron.   Exercise: Stressed the importance of regular exercise.     Orders: -     CBC with Differential/Platelet -     Comprehensive metabolic panel -     TSH -     Lipid panel  Morbid obesity (HCC) Assessment & Plan: Mancel Parsons started in a lowest dose, will wait for priorauthorization  Nutrition: Stressed importance of moderation in sodium intake, saturated fat and cholesterol, caloric balance, sufficient intake of complex carbohydrates, fiber, calcium and iron.   Exercise: Stressed the importance of regular exercise.     Orders: -     CBC with Differential/Platelet -     Comprehensive metabolic panel -     TSH -     Lipid panel -     Semaglutide-Weight Management; Inject 0.25 mg into the skin once a week for 28 days.  Dispense: 2 mL; Refill: 0  Vitamin D deficiency -     VITAMIN D 25 Hydroxy (Vit-D Deficiency, Fractures)  Longstanding persistent atrial fibrillation (HCC) Assessment & Plan: Follow up with cardiology post ablation Keep close monitoring of EKG   Vitamin D insufficiency Assessment & Plan: Continue taking Vitamin D  Will recheck  vitamin  D level today   Mild persistent asthma, uncomplicated Assessment & Plan: Well controlled with albuterol inhaler      Follow-up: Return in about 3 months (around 01/08/2023) for CHRONIC, FASTING.  An After Visit Summary was printed and given to the patient.  I, Neil Crouch have reviewed all documentation for this visit. The documentation on 10/10/22   for the exam, diagnosis, procedures, and orders are all accurate and complete.    Neil Crouch, DNP, [facility] [phone]

## 2022-10-10 NOTE — Assessment & Plan Note (Signed)
Well controlled with albuterol inhaler

## 2022-10-10 NOTE — Assessment & Plan Note (Signed)
Not controlled Home BP 140's/80-90's  Changed Diovan to Diovan-HCT Will follow up as needed  Nutrition: Stressed importance of moderation in sodium intake, saturated fat and cholesterol, caloric balance, sufficient intake of complex carbohydrates, fiber, calcium and iron.   Exercise: Stressed the importance of regular exercise.

## 2022-10-10 NOTE — Patient Instructions (Signed)
Follow up in 3 months Office will you regarding [employer] prior authorization  DASH Eating Plan DASH stands for Dietary Approaches to Stop Hypertension. The DASH eating plan is a healthy eating plan that has been shown to: Reduce high blood pressure (hypertension). Reduce your risk for type 2 diabetes, heart disease, and stroke. Help with weight loss. What are tips for following this plan? Reading food labels Check food labels for the amount of salt (sodium) per serving. Choose foods with less than 5 percent of the Daily Value of sodium. Generally, foods with less than 300 milligrams (mg) of sodium per serving fit into this eating plan. To find whole grains, look for the word "whole" as the first word in the ingredient list. Shopping Buy products labeled as "low-sodium" or "no salt added." Buy fresh foods. Avoid canned foods and pre-made or frozen meals. Cooking Avoid adding salt when cooking. Use salt-free seasonings or herbs instead of table salt or sea salt. Check with your health care provider or pharmacist before using salt substitutes. Do not fry foods. Cook foods using healthy methods such as baking, boiling, grilling, roasting, and broiling instead. Cook with heart-healthy oils, such as olive, canola, avocado, soybean, or sunflower oil. Meal planning  Eat a balanced diet that includes: 4 or more servings of fruits and 4 or more servings of vegetables each day. Try to fill one-half of your plate with fruits and vegetables. 6-8 servings of whole grains each day. Less than 6 oz (170 g) of lean meat, poultry, or fish each day. A 3-oz (85-g) serving of meat is about the same size as a deck of cards. One egg equals 1 oz (28 g). 2-3 servings of low-fat dairy each day. One serving is 1 cup (237 mL). 1 serving of nuts, seeds, or beans 5 times each week. 2-3 servings of heart-healthy fats. Healthy fats called omega-3 fatty acids are found in foods such as walnuts, flaxseeds, fortified milks,  and eggs. These fats are also found in cold-water fish, such as sardines, salmon, and mackerel. Limit how much you eat of: Canned or prepackaged foods. Food that is high in trans fat, such as some fried foods. Food that is high in saturated fat, such as fatty meat. Desserts and other sweets, sugary drinks, and other foods with added sugar. Full-fat dairy products. Do not salt foods before eating. Do not eat more than 4 egg yolks a week. Try to eat at least 2 vegetarian meals a week. Eat more home-cooked food and less restaurant, buffet, and fast food. Lifestyle When eating at a restaurant, ask that your food be prepared with less salt or no salt, if possible. If you drink alcohol: Limit how much you use to: 0-1 drink a day for women who are not pregnant. 0-2 drinks a day for men. Be aware of how much alcohol is in your drink. In the [place] one drink equals one 12 oz bottle of beer (355 mL), one 5 oz glass of wine (148 mL), or one 1 oz glass of hard liquor (44 mL). General information Avoid eating more than 2,300 mg of salt a day. If you have hypertension, you may need to reduce your sodium intake to 1,500 mg a day. Work with your health care provider to maintain a healthy body weight or to lose weight. Ask what an ideal weight is for you. Get at least 30 minutes of exercise that causes your heart to beat faster (aerobic exercise) most days of the week. Activities may include  walking, swimming, or biking. Work with your health care provider or dietitian to adjust your eating plan to your individual calorie needs. What foods should I eat? Fruits All fresh, dried, or frozen fruit. Canned fruit in natural juice (without added sugar). Vegetables Fresh or frozen vegetables (raw, steamed, roasted, or grilled). Low-sodium or reduced-sodium tomato and vegetable juice. Low-sodium or reduced-sodium tomato sauce and tomato paste. Low-sodium or reduced-sodium canned vegetables. Grains Whole-grain or  whole-wheat bread. Whole-grain or whole-wheat pasta. Brown rice. [place]. Bulgur. Whole-grain and low-sodium cereals. Pita bread. Low-fat, low-sodium crackers. Whole-wheat flour tortillas. Meats and other proteins Skinless chicken or [place]. Ground chicken or [place]. Pork with fat trimmed off. Fish and seafood. Egg whites. Dried beans, peas, or lentils. Unsalted nuts, nut butters, and seeds. Unsalted canned beans. Lean cuts of beef with fat trimmed off. Low-sodium, lean precooked or cured meat, such as sausages or meat loaves. Dairy Low-fat (1%) or fat-free (skim) milk. Reduced-fat, low-fat, or fat-free cheeses. Nonfat, low-sodium ricotta or cottage cheese. Low-fat or nonfat yogurt. Low-fat, low-sodium cheese. Fats and oils Soft margarine without trans fats. Vegetable oil. Reduced-fat, low-fat, or light mayonnaise and salad dressings (reduced-sodium). Canola, safflower, olive, avocado, soybean, and sunflower oils. Avocado. Seasonings and condiments Herbs. Spices. Seasoning mixes without salt. Other foods Unsalted popcorn and pretzels. Fat-free sweets. The items listed above may not be a complete list of foods and beverages you can eat. Contact a dietitian for more information. What foods should I avoid? Fruits Canned fruit in a light or heavy syrup. Fried fruit. Fruit in cream or butter sauce. Vegetables Creamed or fried vegetables. Vegetables in a cheese sauce. Regular canned vegetables (not low-sodium or reduced-sodium). Regular canned tomato sauce and paste (not low-sodium or reduced-sodium). Regular tomato and vegetable juice (not low-sodium or reduced-sodium). Angie Fava. Olives. Grains Baked goods made with fat, such as croissants, muffins, or some breads. Dry pasta or rice meal packs. Meats and other proteins Fatty cuts of meat. Ribs. Fried meat. Berniece Salines. Bologna, salami, and other precooked or cured meats, such as sausages or meat loaves. Fat from the back of a pig (fatback).  Bratwurst. Salted nuts and seeds. Canned beans with added salt. Canned or smoked fish. Whole eggs or egg yolks. Chicken or [place] with skin. Dairy Whole or 2% milk, cream, and half-and-half. Whole or full-fat cream cheese. Whole-fat or sweetened yogurt. Full-fat cheese. Nondairy creamers. Whipped toppings. Processed cheese and cheese spreads. Fats and oils Butter. Stick margarine. Lard. Shortening. Ghee. Bacon fat. Tropical oils, such as coconut, palm kernel, or palm oil. Seasonings and condiments Onion salt, garlic salt, seasoned salt, table salt, and sea salt. Worcestershire sauce. Tartar sauce. Barbecue sauce. Teriyaki sauce. Soy sauce, including reduced-sodium. Steak sauce. Canned and packaged gravies. Fish sauce. Oyster sauce. Cocktail sauce. Store-bought horseradish. Ketchup. Mustard. Meat flavorings and tenderizers. Bouillon cubes. Hot sauces. Pre-made or packaged marinades. Pre-made or packaged taco seasonings. Relishes. Regular salad dressings. Other foods Salted popcorn and pretzels. The items listed above may not be a complete list of foods and beverages you should avoid. Contact a dietitian for more information. Where to find more information National Heart, Lung, and Blood Institute: https://wilson-eaton.com/ American Heart Association: www.heart.org Academy of Nutrition and Dietetics: www.eatright.[place]: www.kidney.org Summary The DASH eating plan is a healthy eating plan that has been shown to reduce high blood pressure (hypertension). It may also reduce your risk for type 2 diabetes, heart disease, and stroke. When on the DASH eating plan, aim to eat more fresh fruits and  vegetables, whole grains, lean proteins, low-fat dairy, and heart-healthy fats. With the DASH eating plan, you should limit salt (sodium) intake to 2,300 mg a day. If you have hypertension, you may need to reduce your sodium intake to 1,500 mg a day. Work with your health care provider or  dietitian to adjust your eating plan to your individual calorie needs. This information is not intended to replace advice given to you by your health care provider. Make sure you discuss any questions you have with your health care provider. Document Revised: 07/10/2019 Document Reviewed: 07/10/2019 Elsevier Patient Education  [place].

## 2022-10-10 NOTE — Assessment & Plan Note (Signed)
Continue taking Vitamin D  Will recheck vitamin D level today

## 2022-10-10 NOTE — Assessment & Plan Note (Signed)
Rebecca Rojas started in a lowest dose, will wait for priorauthorization  Nutrition: Stressed importance of moderation in sodium intake, saturated fat and cholesterol, caloric balance, sufficient intake of complex carbohydrates, fiber, calcium and iron.   Exercise: Stressed the importance of regular exercise.

## 2022-10-10 NOTE — Assessment & Plan Note (Signed)
>>  ASSESSMENT AND PLAN FOR BENIGN ESSENTIAL HYPERTENSION WRITTEN ON 10/10/2022  9:53 AM BY Lurline Del, FNP  Not controlled Home BP 140's/80-90's  Changed Diovan to Diovan-HCT Will follow up as needed  Nutrition: Stressed importance of moderation in sodium intake, saturated fat and cholesterol, caloric balance, sufficient intake of complex carbohydrates, fiber, calcium and iron.   Exercise: Stressed the importance of regular exercise.

## 2022-10-10 NOTE — Assessment & Plan Note (Signed)
Follow up with cardiology post ablation Keep close monitoring of EKG

## 2022-10-10 NOTE — Assessment & Plan Note (Signed)
>>  ASSESSMENT AND PLAN FOR ATRIAL FIBRILLATION (HCC) WRITTEN ON 10/10/2022  9:54 AM BY Lurline Del, FNP  Follow up with cardiology post ablation Keep close monitoring of EKG

## 2022-10-10 NOTE — Assessment & Plan Note (Signed)
Lipid profile will be checked   Nutrition: Stressed importance of moderation in sodium intake, saturated fat and cholesterol, caloric balance, sufficient intake of complex carbohydrates, fiber, calcium and iron.   Exercise: Stressed the importance of regular exercise.

## 2022-10-11 LAB — LIPID PANEL
Chol/HDL Ratio: 4 ratio (ref 0.0–4.4)
Cholesterol, Total: 177 mg/dL (ref 100–199)
HDL: 44 mg/dL (ref >39)
LDL Chol Calc (NIH): 109 mg/dL — ABNORMAL HIGH (ref 0–99)
Triglycerides: 135 mg/dL (ref 0–149)
VLDL Cholesterol Cal: 24 mg/dL (ref 5–40)

## 2022-10-11 LAB — COMPREHENSIVE METABOLIC PANEL
ALT: 17 IU/L (ref 0–32)
AST: 19 IU/L (ref 0–40)
Albumin/Globulin Ratio: 1.5 (ref 1.2–2.2)
Albumin: 4.1 g/dL (ref 3.9–4.9)
Alkaline Phosphatase: 89 IU/L (ref 44–121)
BUN/Creatinine Ratio: 18 (ref 12–28)
BUN: 14 mg/dL (ref 8–27)
Bilirubin Total: 0.6 mg/dL (ref 0.0–1.2)
CO2: 23 mmol/L (ref 20–29)
Calcium: 9.2 mg/dL (ref 8.7–10.3)
Chloride: 103 mmol/L (ref 96–106)
Creatinine, Ser: 0.76 mg/dL (ref 0.57–1.00)
Globulin, Total: 2.7 g/dL (ref 1.5–4.5)
Glucose: 105 mg/dL — ABNORMAL HIGH (ref 70–99)
Potassium: 5.1 mmol/L (ref 3.5–5.2)
Sodium: 141 mmol/L (ref 134–144)
Total Protein: 6.8 g/dL (ref 6.0–8.5)
eGFR: 86 mL/min/{1.73_m2} (ref >59)

## 2022-10-11 LAB — CBC WITH DIFFERENTIAL/PLATELET
Basophils Absolute: 0.1 10*3/uL (ref 0.0–0.2)
Basos: 1 %
EOS (ABSOLUTE): 0.3 10*3/uL (ref 0.0–0.4)
Eos: 3 %
Hematocrit: 43.3 % (ref 34.0–46.6)
Hemoglobin: 14.2 g/dL (ref 11.1–15.9)
Immature Grans (Abs): 0 10*3/uL (ref 0.0–0.1)
Immature Granulocytes: 0 %
Lymphocytes Absolute: 4 10*3/uL — ABNORMAL HIGH (ref 0.7–3.1)
Lymphs: 43 %
MCH: 29.2 pg (ref 26.6–33.0)
MCHC: 32.8 g/dL (ref 31.5–35.7)
MCV: 89 fL (ref 79–97)
Monocytes Absolute: 0.6 10*3/uL (ref 0.1–0.9)
Monocytes: 7 %
Neutrophils Absolute: 4.1 10*3/uL (ref 1.4–7.0)
Neutrophils: 46 %
Platelets: 313 10*3/uL (ref 150–450)
RBC: 4.87 x10E6/uL (ref 3.77–5.28)
RDW: 13.4 % (ref 11.7–15.4)
WBC: 9.2 10*3/uL (ref 3.4–10.8)

## 2022-10-11 LAB — VITAMIN D 25 HYDROXY (VIT D DEFICIENCY, FRACTURES): Vit D, 25-Hydroxy: 47.6 ng/mL (ref 30.0–100.0)

## 2022-10-11 LAB — CARDIOVASCULAR RISK ASSESSMENT

## 2022-10-11 LAB — TSH: TSH: 4.03 u[IU]/mL (ref 0.450–4.500)

## 2022-11-03 IMAGING — MG MM DIGITAL SCREENING BILAT W/ TOMO AND CAD
8 series · 8 of 24 positions shown · non-contrast
Comparison: Previous exam(s).

CLINICAL DATA: Screening.

EXAM:
DIGITAL SCREENING BILATERAL MAMMOGRAM WITH TOMOSYNTHESIS AND CAD
TECHNIQUE: Bilateral screening digital craniocaudal and mediolateral oblique
mammograms were obtained. Bilateral screening digital breast
tomosynthesis was performed. The images were evaluated with
computer-aided detection.

[R MLO synth-2D]
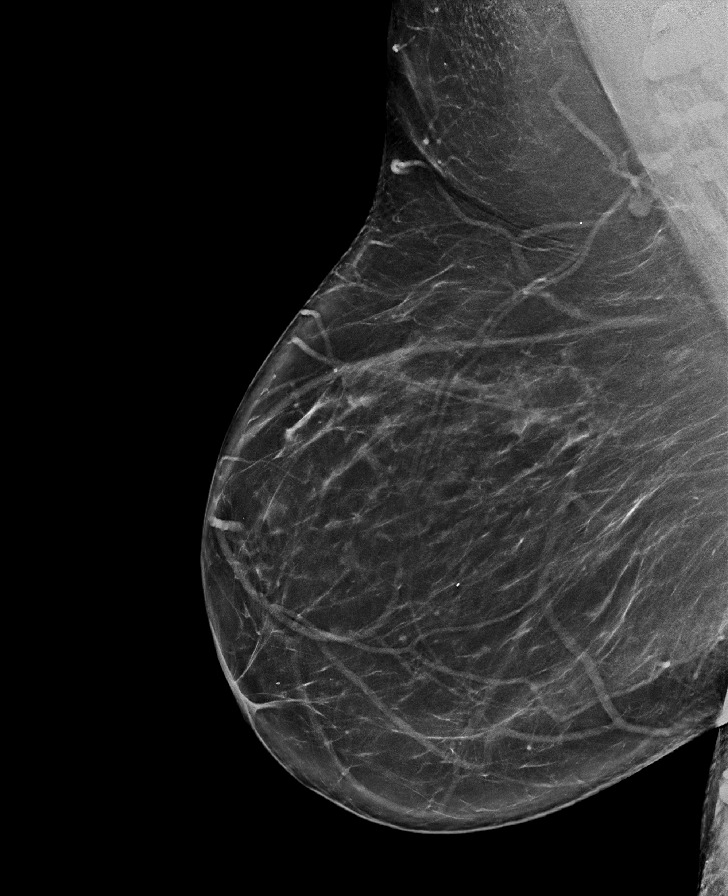

[L MLO synth-2D]
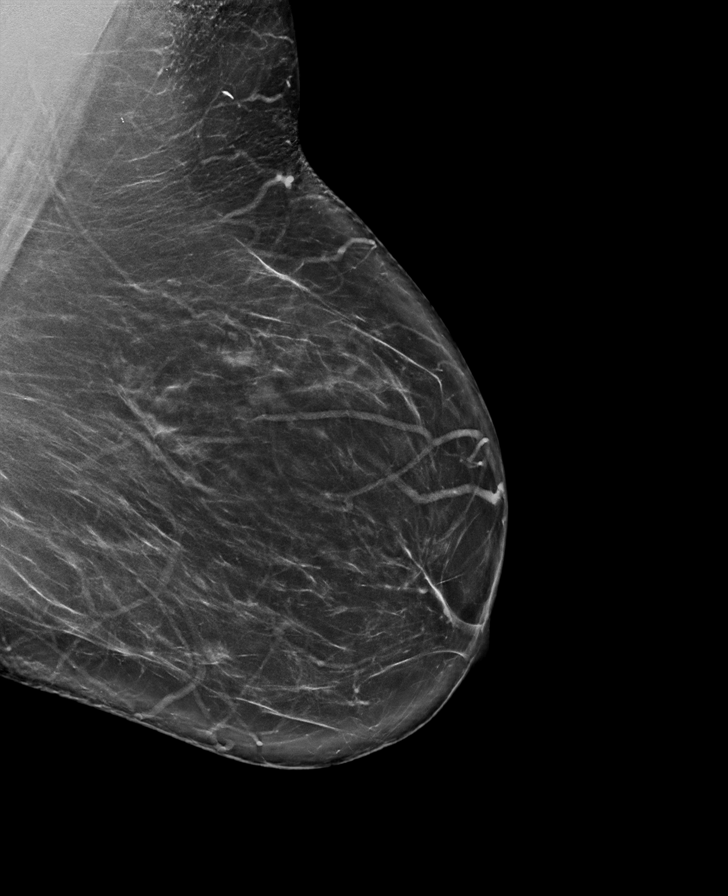

[L CC synth-2D]
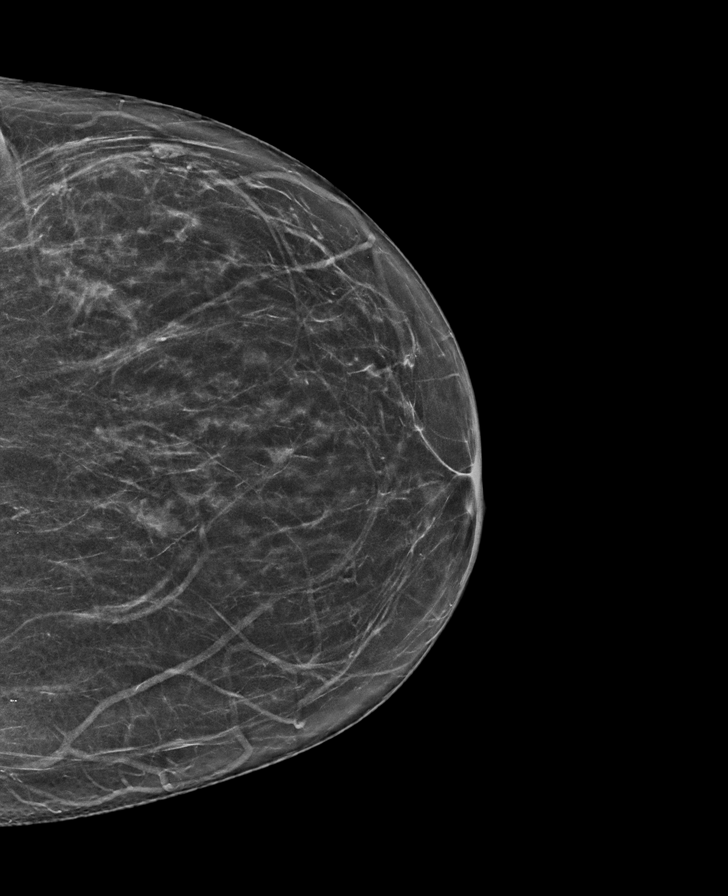

[R CC synth-2D]
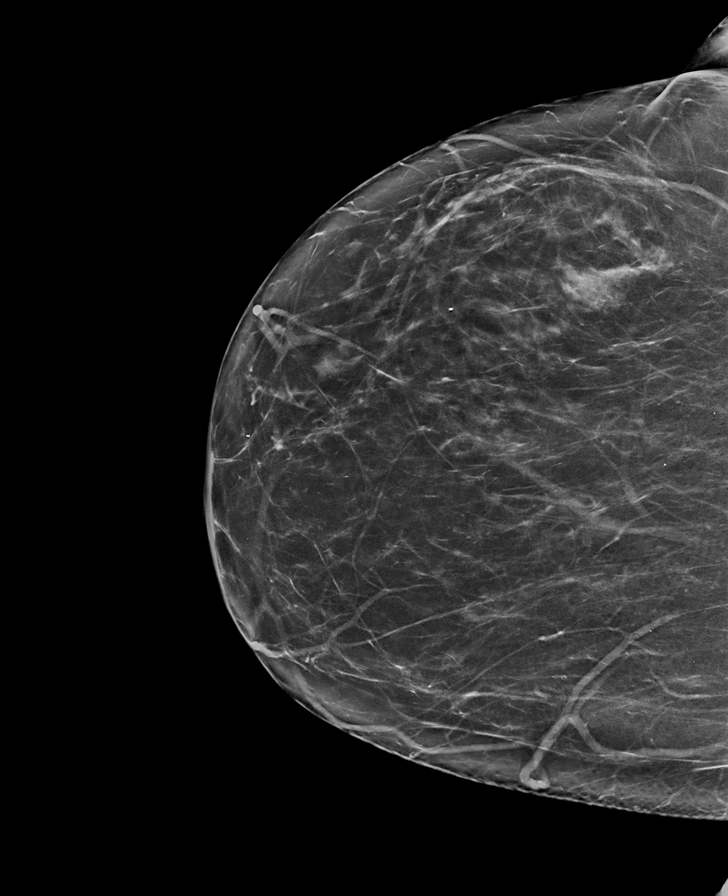

[R MLO tomo · tomo slice 47/92.0]
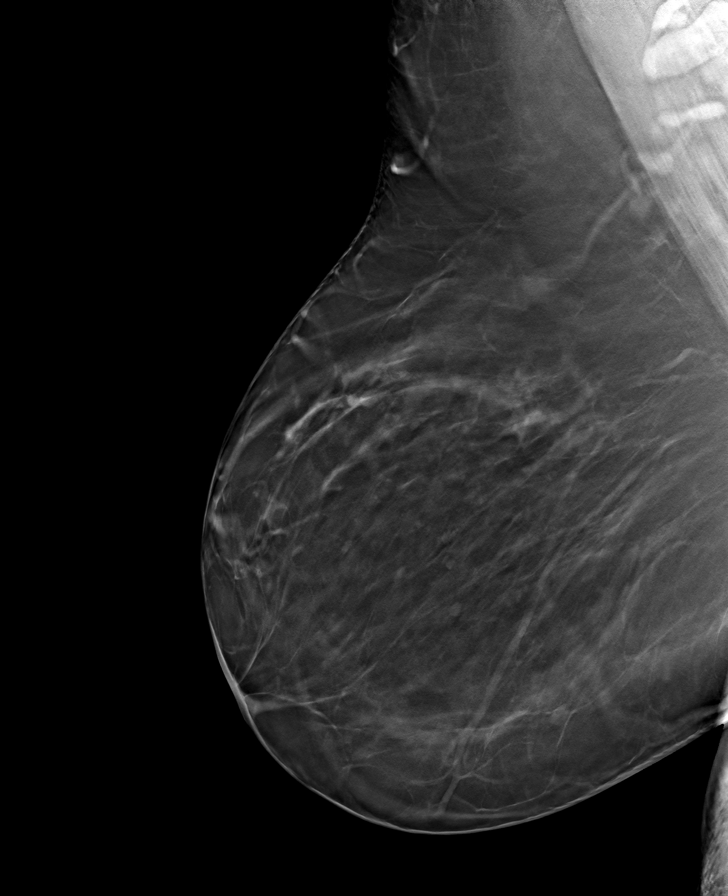

[L CC tomo · tomo slice 35/69.0]
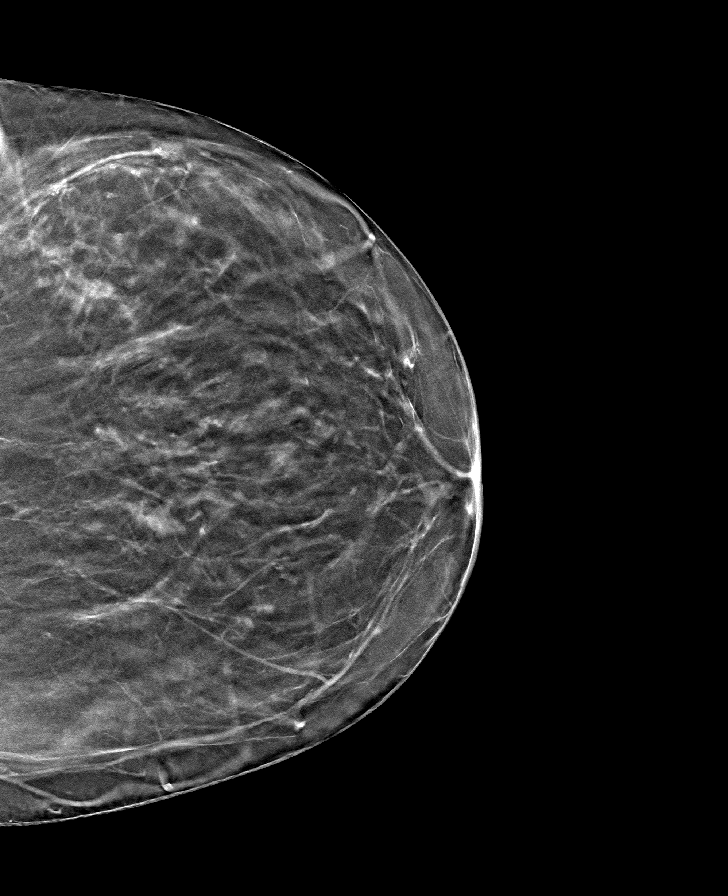

[L MLO tomo · tomo slice 46/91.0]
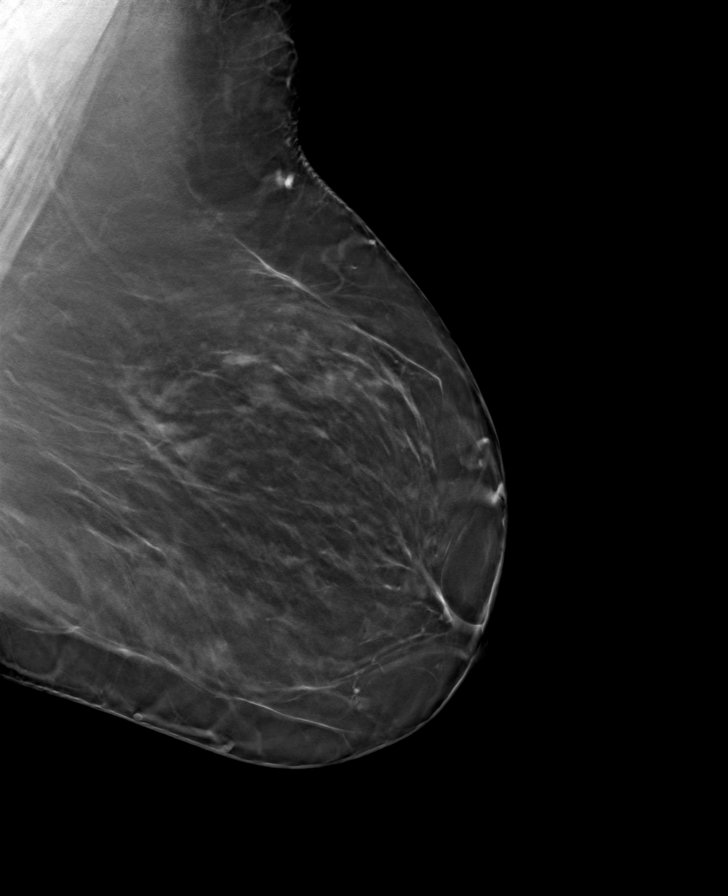

[R CC tomo · tomo slice 39/78.0]
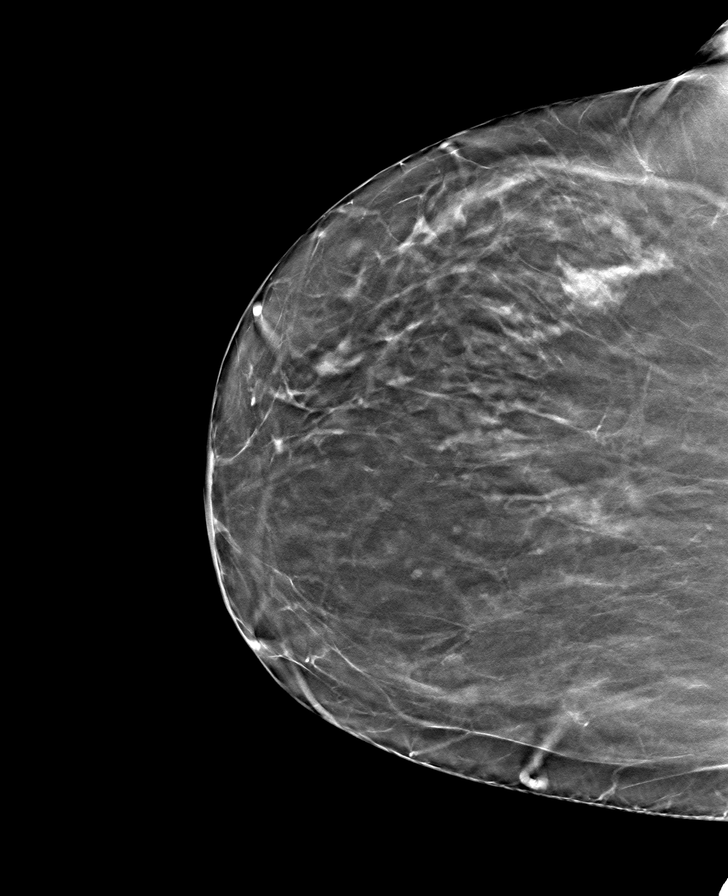

[8 of 24 positions shown; findings below may reference images not displayed]

ACR Breast Density Category b: There are scattered areas of
fibroglandular density.
FINDINGS: There are no findings suspicious for malignancy.
IMPRESSION: No mammographic evidence of malignancy. A result letter of this
screening mammogram will be mailed directly to the patient.

RECOMMENDATION:
Screening mammogram in one year. (Code:51-O-LD2)

BI-RADS CATEGORY  1: Negative.

## 2022-11-05 ENCOUNTER — Encounter: Payer: Self-pay | Admitting: Cardiology

## 2022-11-05 ENCOUNTER — Ambulatory Visit: Payer: Medicare Other | Attending: Cardiology | Admitting: Cardiology

## 2022-11-05 VITALS — BP 116/84 | HR 85 | Ht 61.0 in | Wt 279.0 lb

## 2022-11-05 DIAGNOSIS — I480 Paroxysmal atrial fibrillation: Secondary | ICD-10-CM | POA: Diagnosis not present

## 2022-11-05 DIAGNOSIS — I4719 Other supraventricular tachycardia: Secondary | ICD-10-CM | POA: Insufficient documentation

## 2022-11-05 DIAGNOSIS — G4733 Obstructive sleep apnea (adult) (pediatric): Secondary | ICD-10-CM | POA: Insufficient documentation

## 2022-11-05 NOTE — Progress Notes (Signed)
Electrophysiology Office Note   Date:  11/05/2022   ID:  Rebecca Rojas, DOB 09/04/1955, MRN [id number]  PCP:  Rochel Brome, MD  Cardiologist:  Bettina Gavia Primary Electrophysiologist:  Kathlynn Swofford Meredith Leeds, MD    Chief Complaint: AF, SVT   History of Present Illness: Rebecca Rojas is a 67 y.o. adult who is being seen today for the evaluation of AF, SVT at the request of Rip Harbour, NP. Presenting today for electrophysiology evaluation.  She has a history seen for atrial fibrillation, obstructive sleep apnea, hypertension, SVT.  She presented to [facility] with episodes of SVT.  SVT degenerated into atrial fibrillation.  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  Today, denies symptoms of palpitations, chest pain, shortness of breath, orthopnea, PND, lower extremity edema, claudication, dizziness, presyncope, syncope, bleeding, or neurologic sequela. The patient is tolerating medications without difficulties.  Since being seen she has done well.  She has been trying to exercise.  She has had intermittent palpitations once every few months that lasts from hours to half a day.  They make her feel tired.  She sits down or goes to sleep and they usually go away by the time she wakes up.  Aside from that she has no complaints.    Past Medical History:  Diagnosis Date   Acute laryngopharyngitis 01/13/2020   Acute non-recurrent frontal sinusitis 09/22/2017   Allergic rhinitis 02/15/2021   Allergic rhinitis due to animal (cat) (dog) hair and dander 02/15/2021   Allergic rhinitis due to pollen 02/15/2021   Allergy    dust, environmental, feathers, has epipen, prn   Allergy-induced asthma    Anemia    Annual physical exam 02/15/2021   Arthritis    osteoarthritis   Arthritis of right acromioclavicular joint 06/15/2019   Asthma 12/28/2015   Asthma with exacerbation 04/27/2020   Atrial fibrillation ([place]) 05/20/2020   Atrial fibrillation with RVR ([place]) 05/20/2020   Back pain     Benign essential hypertension 12/28/2015   Bilateral lower extremity edema 08/04/2018   Chronic allergic conjunctivitis 02/15/2021   Encounter for screening colonoscopy 01/08/2017   Encounter for screening mammogram for breast cancer 02/15/2021   Glenoid labral tear, right, initial encounter 06/15/2019   Headache    Healthcare maintenance 01/08/2017   High risk medication use 12/28/2015   History of seasonal allergies    Hordeolum internum of right lower eyelid 02/14/2017   Hypertensive heart disease 06/02/2020   Insulin resistance 10/01/2019   Joint pain    Lower extremity edema    Lower respiratory infection (e.g., bronchitis, pneumonia, pneumonitis, pulmonitis) 05/10/2009   Qualifier: Diagnosis of  By: Gwenette Greet MD, Armando Reichert    Migraine headache 12/28/2015   Mild persistent asthma, uncomplicated [id number]   Mixed hyperlipidemia 12/28/2015   Morbid obesity ([place])    Myalgia 12/28/2015   Need for shingles vaccine 02/15/2021   Osteoarthritis    Other chest pain 02/15/2021   Other specified menopausal and perimenopausal disorders 02/15/2021   Pain of right eye 02/14/2017   Pain of right foot 08/30/2016   Persistent cough for 3 weeks or longer 10/11/2017   Pharyngitis 05/23/2018   Rotator cuff tear, right 06/15/2019   Shortness of breath    maybe with exertion   Skin lesion of foot 08/30/2016   Sleep apnea    uses cpap   Sleep apnea with use of continuous positive airway pressure (CPAP) 02/15/2021   Sore throat 05/11/2018   Unspecified open wound, left lower leg, initial  encounter 08/04/2018   Vertigo    Vitamin D deficiency    Past Surgical History:  Procedure Laterality Date   ABDOMINAL HYSTERECTOMY     COLONOSCOPY  09/2010   normal   FRACTURE SURGERY     left 5th finger fx with bone graft repair   HEEL SPUR EXCISION     JOINT REPLACEMENT     KNEE ARTHROSCOPY Left    Left Knee lateral release     LIPOMA EXCISION     SHOULDER ARTHROSCOPY WITH ROTATOR CUFF REPAIR AND  SUBACROMIAL DECOMPRESSION Right 06/15/2019   Procedure: SHOULDER ARTHROSCOPY WITH ROTATOR CUFF REPAIR AND SUBACROMIAL DECOMPRESSION;  Surgeon: Dorna Leitz, MD;  Location: [facility];  Service: Orthopedics;  Laterality: Right;   SVT ABLATION N/A 12/06/2020   Procedure: SVT ABLATION;  Surgeon: Constance Haw, MD;  Location: [facility];  Service: Cardiovascular;  Laterality: N/A;   TONSILLECTOMY     TOTAL KNEE ARTHROPLASTY  08/03/2011   Procedure: TOTAL KNEE ARTHROPLASTY;  Surgeon: Alta Corning;  Location: [facility];  Service: Orthopedics;  Laterality: Right;  COMPUTER ASSISTED TOTAL KNEE REPLACEMENTwith revision tibial component   TUBAL LIGATION       Current Outpatient Medications  Medication Sig Dispense Refill   albuterol (PROVENTIL HFA;VENTOLIN HFA) 108 (90 BASE) MCG/ACT inhaler Inhale 1-2 puffs into the lungs every 6 (six) hours as needed for wheezing or shortness of breath.      aspirin EC 81 MG tablet Take 81 mg by mouth daily. Swallow whole.     azelastine (OPTIVAR) 0.05 % ophthalmic solution Place 1 drop into both eyes 2 (two) times daily as needed for allergies (allergies).     cyclobenzaprine (FLEXERIL) 10 MG tablet Take 10 mg by mouth 3 (three) times daily as needed.     EPINEPHrine 0.3 mg/0.3 mL IJ SOAJ injection Inject 0.3 mg into the muscle as needed for anaphylaxis.     etodolac (LODINE) 400 MG tablet Take 400 mg by mouth 2 (two) times daily.     gabapentin (NEURONTIN) 300 MG capsule Take 1 capsule (300 mg total) by mouth 3 (three) times daily. 180 capsule 1   ipratropium (ATROVENT) 0.02 % nebulizer solution Take 3 mLs by nebulization every 6 (six) hours as needed for wheezing or shortness of breath.     levocetirizine (XYZAL) 5 MG tablet Take 5 mg by mouth every evening.     metoprolol succinate (TOPROL-XL) 25 MG 24 hr tablet TAKE 1 TABLET (25 MG TOTAL) BY MOUTH DAILY. 90 tablet 3   montelukast (SINGULAIR) 10 MG tablet Take 10 mg by mouth at bedtime.      Semaglutide-Weight Management 0.25 MG/0.5ML SOAJ Inject 0.25 mg into the skin once a week for 28 days. 2 mL 0   valsartan-hydrochlorothiazide (DIOVAN-HCT) 160-12.5 MG tablet Take 1 tablet by mouth daily. 90 tablet 1   Vitamin D, Ergocalciferol, (DRISDOL) 1.25 MG (50000 UNIT) CAPS capsule Take 1 capsule (50,000 Units total) by mouth every 7 (seven) days. 12 capsule 1   WIXELA INHUB 250-50 MCG/ACT AEPB Inhale 1 puff into the lungs 2 (two) times daily.     No current facility-administered medications for this visit.    Allergies:   Sulfa antibiotics, Cefdinir, Erythromycin, and Hydrocodone bit-homatrop mbr   Social History:  The patient  reports that she has never smoked. She has never been exposed to tobacco smoke. She has never used smokeless tobacco. She reports that she does not currently use alcohol. She reports that she does  not use drugs.   Family History:  The patient's family history includes Cancer in her father; Heart attack in her mother; Heart disease in her mother; Hyperlipidemia in her mother; Hypertension in her mother; Liver disease in her father; Obesity in her father and mother; Stroke in her mother.   ROS:  Please see the history of present illness.   Otherwise, review of systems is positive for none.   All other systems are reviewed and negative.   PHYSICAL EXAM: VS:  BP 116/84   Pulse 85   Ht 5\' 1"  (1.549 m)   Wt 279 lb (126.6 kg)   SpO2 98%   BMI 52.72 kg/m  , BMI Body mass index is 52.72 kg/m. GEN: Well nourished, well developed, in no acute distress  HEENT: normal  Neck: no JVD, carotid bruits, or masses Cardiac: RRR; no murmurs, rubs, or gallops,no edema  Respiratory:  clear to auscultation bilaterally, normal work of breathing GI: soft, nontender, nondistended, + BS MS: no deformity or atrophy  Skin: warm and dry Neuro:  Strength and sensation are intact Psych: euthymic mood, full affect  EKG:  EKG is ordered today. Personal review of the ekg ordered shows  sinus rhythm, rate 85  Recent Labs: 10/10/2022: ALT 17; BUN 14; Creatinine, Ser 0.76; Hemoglobin 14.2; Platelets 313; Potassium 5.1; Sodium 141; TSH 4.030    Lipid Panel     Component Value Date/Time   CHOL 177 10/10/2022 0841   TRIG 135 10/10/2022 0841   HDL 44 10/10/2022 0841   CHOLHDL 4.0 10/10/2022 0841   LDLCALC 109 (H) 10/10/2022 0841     Wt Readings from Last 3 Encounters:  11/05/22 279 lb (126.6 kg)  10/10/22 275 lb (124.7 kg)  07/02/22 275 lb (124.7 kg)      Other studies Reviewed: Additional studies/ records that were reviewed today include: TTE 05/23/2020 Review of the above records today demonstrates:    Left Ventricle: Wall thickness is normal.    Left Ventricle: Wall motion is normal.    Left Ventricle: Doppler parameters consistent with mild diastolic  dysfunction and low to normal LA pressure.    Left Ventricle: Systolic function is normal. EF: 55-60%.    Left Atrium: Left atrium is mildly dilated.    No significant valvular abnormality.    ASSESSMENT AND PLAN:  1.  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  Post ablation 12/06/2020.  No episodes further of SVT.  2.  Paroxysmal atrial fibrillation: CHA2DS2-VASc of 2.  Currently on metoprolol as it appears that SVT was a trigger for her atrial fibrillation.  She is having some palpitations with could be due to atrial fibrillation.  She Rebecca Rojas try and get a cardia mobile recording and Rebecca Rojas send it via MyChart.  Otherwise no changes.  3.  Obstructive sleep apnea: CPAP compliance encouraged   Current medicines are reviewed at length with the patient today.   The patient does not have concerns regarding her medicines.  The following changes were made today: None  Labs/ tests ordered today include:  Orders Placed This Encounter  Procedures   EKG 12-Lead      Disposition:   FU 12 months  Signed, Sumiko Ceasar Meredith Leeds, MD  11/05/2022 10:42 AM     [facility] [address] [place]  [address] [phone] (office) [phone] (fax)

## 2022-11-05 NOTE — Patient Instructions (Signed)
Medication Instructions:  °Your physician recommends that you continue on your current medications as directed. Please refer to the Current Medication list given to you today. ° °*If you need a refill on your cardiac medications before your next appointment, please call your pharmacy* ° ° °Lab Work: °None ordered ° ° °Testing/Procedures: °None ordered ° ° °Follow-Up: °At [facility], you and your health needs are our priority.  As part of our continuing mission to provide you with exceptional heart care, we have created designated Provider Care Teams.  These Care Teams include your primary Cardiologist (physician) and Advanced Practice Providers (APPs -  Physician Assistants and Nurse Practitioners) who all work together to provide you with the care you need, when you need it. ° °Your next appointment:   °1 year(s) ° °The format for your next appointment:   °In Person ° °Provider:   °Will Camnitz, MD ° ° ° °Thank you for choosing [facility]!! ° ° °Llewellyn Choplin, RN °[phone] ° ° ° °

## 2022-11-22 ENCOUNTER — Other Ambulatory Visit: Payer: Self-pay

## 2022-11-22 DIAGNOSIS — G5791 Unspecified mononeuropathy of right lower limb: Secondary | ICD-10-CM

## 2022-11-22 MED ORDER — GABAPENTIN 300 MG PO CAPS: 300.0000 mg | ORAL_CAPSULE | Freq: Three times a day (TID) | ORAL | 1 refills | Status: DC

## 2022-11-26 ENCOUNTER — Other Ambulatory Visit: Payer: Self-pay

## 2022-11-26 MED ORDER — METOPROLOL SUCCINATE ER 25 MG PO TB24: 25.0000 mg | ORAL_TABLET | Freq: Every day | ORAL | 3 refills | Status: DC

## 2022-12-11 ENCOUNTER — Encounter (HOSPITAL_BASED_OUTPATIENT_CLINIC_OR_DEPARTMENT_OTHER): Payer: Self-pay | Admitting: Pulmonary Disease

## 2022-12-11 ENCOUNTER — Ambulatory Visit (INDEPENDENT_AMBULATORY_CARE_PROVIDER_SITE_OTHER): Payer: Medicare Other | Admitting: Pulmonary Disease

## 2022-12-11 VITALS — BP 124/84 | HR 90 | Temp 97.9°F | Ht 63.0 in | Wt 279.0 lb

## 2022-12-11 DIAGNOSIS — G4733 Obstructive sleep apnea (adult) (pediatric): Secondary | ICD-10-CM

## 2022-12-11 NOTE — Patient Instructions (Signed)
Will arrange for set up through [facility] to get a new Resmed CPAP machine  Follow up in 4 months

## 2022-12-11 NOTE — Progress Notes (Signed)
[facility]  Chief Complaint  Patient presents with   Follow-up    Follow up. No complaints.     Past Surgical History:  She  has a past surgical history that includes Tonsillectomy; Fracture surgery; Abdominal hysterectomy; Tubal ligation; Total knee arthroplasty (08/03/2011); Joint replacement; Shoulder arthroscopy with rotator cuff repair and subacromial decompression (Right, 06/15/2019); Excision heel spur excision; Lipoma excision; Left Knee lateral release; Knee arthroscopy (Left); Colonoscopy (09/2010); and SVT ABLATION (N/A, 12/06/2020).  Past Medical History:  Allergies, Asthma, OA, HA   Constitutional:  BP 124/84 (BP Location: Right Arm, Patient Position: Sitting, Cuff Size: Large)   Pulse 90   Temp 97.9 F (36.6 C) (Oral)   Ht  (1.6 m)   Wt 279 lb (126.6 kg)   SpO2 97%   BMI 49.42 kg/m   Brief Summary:  Rebecca Rojas is a 67 y.o. adult with obstructive sleep apnea.      Subjective:   She is here with her husband.  She had a Respironics machine.  For a while she was using her husbands old Resmed machine.  She finally received a replacement device.  She has been purchasing supplies on her own through [place] because she never received follow up from her previous DME after getting the replacement Respironics device.  She uses nasal pillow mask.  Not having sinus congestion, cough, or chest congestion.    Goes to bed at 10 pm.  Sleeps through the night.  Gets up at 8 am.  Feels rested.  Gets sleepy later in the day after taking gabapentin or cyclobenzaprine.  Physical Exam:   Appearance - well kempt   ENMT - no sinus tenderness, no oral exudate, no LAN, Mallampati 3 airway, no stridor, scalloped tongue  Respiratory - equal breath sounds bilaterally, no wheezing or rales  CV - s1s2 regular rate and rhythm, no murmurs  Ext - no clubbing, no edema  Skin - no rashes  Psych - normal mood and affect   Sleep  Tests:  PSG 07/28/15 >> AHI 15.2, SpO2 low 79% CPAP 03/19/18 to 04/17/18 >> used on 28 of 30 nights with average 6 hrs 30 min.  Average AHI 0.3 with CPAP 9 cm H2O.   Social History:  She  reports that she has never smoked. She has never been exposed to tobacco smoke. She has never used smokeless tobacco. She reports that she does not currently use alcohol. She reports that she does not use drugs.  Family History:  Her family history includes Cancer in her father; Heart attack in her mother; Heart disease in her mother; Hyperlipidemia in her mother; Hypertension in her mother; Liver disease in her father; Obesity in her father and mother; Stroke in her mother.     Assessment/Plan:   Obstructive sleep apnea. - she is compliant with CPAP and reports benefit from therapy - she will need a new DME; she would like to get set up through [facility] since her husband uses this DME also - she needs a new device - will arrange for a new Resmed CPAP at 9 cm H2O  Allergic asthma. - followed by Dr. Sidney Ace  Atrial fibrillation, AVNRT. - followed by Dr. Loman Brooklyn with EP cardiology  Time Spent Involved in Patient Care on Day of Examination:  26 minutes  Follow up:   Patient Instructions  Will arrange for set up through [facility] to get a new Resmed CPAP machine  Follow up in 4 months  Medication List:   Allergies as of 12/11/2022       Reactions   Sulfa Antibiotics Itching   Cefdinir Itching   Erythromycin    Hydrocodone Bit-homatrop Mbr Rash        Medication List        Accurate as of December 11, 2022  8:54 AM. If you have any questions, ask your nurse or doctor.          albuterol 108 (90 Base) MCG/ACT inhaler Commonly known as: VENTOLIN HFA Inhale 1-2 puffs into the lungs every 6 (six) hours as needed for wheezing or shortness of breath.   aspirin EC 81 MG tablet Take 81 mg by mouth daily. Swallow whole.   azelastine 0.05 % ophthalmic  solution Commonly known as: OPTIVAR Place 1 drop into both eyes 2 (two) times daily as needed for allergies (allergies).   cyclobenzaprine 10 MG tablet Commonly known as: FLEXERIL Take 10 mg by mouth 3 (three) times daily as needed.   EPINEPHrine 0.3 mg/0.3 mL Soaj injection Commonly known as: EPI-PEN Inject 0.3 mg into the muscle as needed for anaphylaxis.   etodolac 400 MG tablet Commonly known as: LODINE Take 400 mg by mouth as needed.   gabapentin 300 MG capsule Commonly known as: NEURONTIN Take 1 capsule (300 mg total) by mouth 3 (three) times daily.   ipratropium 0.02 % nebulizer solution Commonly known as: ATROVENT Take 3 mLs by nebulization every 6 (six) hours as needed for wheezing or shortness of breath.   levocetirizine 5 MG tablet Commonly known as: XYZAL Take 5 mg by mouth every evening.   metoprolol succinate 25 MG 24 hr tablet Commonly known as: TOPROL-XL Take 1 tablet (25 mg total) by mouth daily.   montelukast 10 MG tablet Commonly known as: SINGULAIR Take 10 mg by mouth at bedtime.   valsartan-hydrochlorothiazide 160-12.5 MG tablet Commonly known as: DIOVAN-HCT Take 1 tablet by mouth daily.   Vitamin D (Ergocalciferol) 1.25 MG (50000 UNIT) Caps capsule Commonly known as: DRISDOL Take 1 capsule (50,000 Units total) by mouth every 7 (seven) days.   Wixela Inhub 250-50 MCG/ACT Aepb Generic drug: fluticasone-salmeterol Inhale 1 puff into the lungs 2 (two) times daily.        Signature:  Coralyn Helling, MD [facility]/Critical Care Pager - [phone] 12/11/2022, 8:54 AM

## 2022-12-26 ENCOUNTER — Other Ambulatory Visit: Payer: Self-pay

## 2022-12-26 MED ORDER — ETODOLAC 400 MG PO TABS: 400.0000 mg | ORAL_TABLET | ORAL | 2 refills | Status: DC | PRN

## 2023-01-22 NOTE — Progress Notes (Unsigned)
Subjective:  Patient ID: Rebecca Rojas, adult    DOB: [birth year]  Age: 67 y.o. MRN: [id number]  Chief Complaint  Patient presents with   Medical Management of Chronic Issues    HPI    Pt presents for follow-up of HTN, Hyperlipidemia, and asthma.   Hypertension, follow-up:  Medication management includes Metoprolol 25 mg QD. She reports good compliance with treatment. She is not having side effects.  She is following a Regular diet. She is not exercising. She does not smoke. Home bp readings have been very good.  Lipid/Cholesterol, Follow-up   She was last seen for this 3 months ago.  Management includes diet modification. Current diet: well balanced Current exercise: none   Allergy induced asthma, follow-up: Rebecca Rojas has life-long allergy induced asthma. Current treatment includes Singulair, Xyzal, Breo inhaler, Albuterol, and Proventil. States symptoms are currently well-controlled.      01/23/2023    7:55 AM 10/10/2022    9:52 AM 06/18/2022    8:15 AM 09/11/2021    7:55 AM 11/15/2020    2:59 PM  Depression screen PHQ 2/9  Decreased Interest 0 0 0 0 0  Down, Depressed, Hopeless 0 0 0 0 0  PHQ - 2 Score 0 0 0 0 0  Altered sleeping 0      Tired, decreased energy 0      Change in appetite 0      Feeling bad or failure about yourself  0      Trouble concentrating 0      Moving slowly or fidgety/restless 0      Suicidal thoughts 0      PHQ-9 Score 0      Difficult doing work/chores Not difficult at all            01/23/2023    7:55 AM  Fall Risk   Falls in the past year? 0  Number falls in past yr: 0  Injury with Fall? 0  Risk for fall due to : No Fall Risks  Follow up Falls evaluation completed;Falls prevention discussed    Patient Care Team: Blane Ohara, MD as PCP - General (Family Medicine) Regan Lemming, MD as PCP - Electrophysiology (Cardiology) Regan Lemming, MD as PCP - Cardiology (Cardiology)   Review of Systems  Constitutional:   Negative for chills, fatigue and fever.  HENT:  Negative for congestion, rhinorrhea and sore throat.   Respiratory:  Positive for cough. Negative for shortness of breath.   Cardiovascular:  Positive for palpitations. Negative for chest pain.  Gastrointestinal:  Negative for abdominal pain, constipation, diarrhea, nausea and vomiting.  Genitourinary:  Negative for dysuria and urgency.  Musculoskeletal:  Positive for arthralgias (left knee pain). Negative for back pain and myalgias.  Neurological:  Negative for dizziness, weakness, light-headedness and headaches.  Psychiatric/Behavioral:  Negative for dysphoric mood. The patient is not nervous/anxious.   +  Current Outpatient Medications on File Prior to Visit  Medication Sig Dispense Refill   albuterol (PROVENTIL HFA;VENTOLIN HFA) 108 (90 BASE) MCG/ACT inhaler Inhale 1-2 puffs into the lungs every 6 (six) hours as needed for wheezing or shortness of breath.      aspirin EC 81 MG tablet Take 81 mg by mouth daily. Swallow whole.     azelastine (OPTIVAR) 0.05 % ophthalmic solution Place 1 drop into both eyes 2 (two) times daily as needed for allergies (allergies).     cyclobenzaprine (FLEXERIL) 10 MG tablet Take 10 mg by mouth 3 (three) times daily  as needed.     EPINEPHrine 0.3 mg/0.3 mL IJ SOAJ injection Inject 0.3 mg into the muscle as needed for anaphylaxis.     etodolac (LODINE) 400 MG tablet Take 1 tablet (400 mg total) by mouth as needed. 30 tablet 2   gabapentin (NEURONTIN) 300 MG capsule Take 1 capsule (300 mg total) by mouth 3 (three) times daily. 180 capsule 1   ipratropium (ATROVENT) 0.02 % nebulizer solution Take 3 mLs by nebulization every 6 (six) hours as needed for wheezing or shortness of breath.     levocetirizine (XYZAL) 5 MG tablet Take 5 mg by mouth every evening.     metoprolol succinate (TOPROL-XL) 25 MG 24 hr tablet Take 1 tablet (25 mg total) by mouth daily. 90 tablet 3   montelukast (SINGULAIR) 10 MG tablet Take 10 mg by  mouth at bedtime.     valsartan-hydrochlorothiazide (DIOVAN-HCT) 160-12.5 MG tablet Take 1 tablet by mouth daily. 90 tablet 1   Vitamin D, Ergocalciferol, (DRISDOL) 1.25 MG (50000 UNIT) CAPS capsule Take 1 capsule (50,000 Units total) by mouth every 7 (seven) days. 12 capsule 1   WIXELA INHUB 250-50 MCG/ACT AEPB Inhale 1 puff into the lungs 2 (two) times daily.     No current facility-administered medications on file prior to visit.   Past Medical History:  Diagnosis Date   Allergic rhinitis 02/15/2021   Allergic rhinitis due to animal (cat) (dog) hair and dander 02/15/2021   Allergic rhinitis due to pollen 02/15/2021   Allergy    dust, environmental, feathers, has epipen, prn   Allergy-induced asthma    Anemia    Arthritis    osteoarthritis   Arthritis of right acromioclavicular joint 06/15/2019   Atrial fibrillation (HCC) 05/20/2020   Back pain    Benign essential hypertension 12/28/2015   Bilateral lower extremity edema 08/04/2018   Chronic allergic conjunctivitis 02/15/2021   Glenoid labral tear, right, initial encounter 06/15/2019   Headache    Hordeolum internum of right lower eyelid 02/14/2017   Hypertensive heart disease 06/02/2020   Insulin resistance 10/01/2019   Joint pain    Migraine headache 12/28/2015   Mixed hyperlipidemia 12/28/2015   Morbid obesity (HCC)    Myalgia 12/28/2015   Osteoarthritis    Rotator cuff tear, right 06/15/2019   Skin lesion of foot 08/30/2016   Sleep apnea    uses cpap   Vertigo    Vitamin D deficiency    Past Surgical History:  Procedure Laterality Date   ABDOMINAL HYSTERECTOMY     COLONOSCOPY  09/2010   normal   FRACTURE SURGERY     left 5th finger fx with bone graft repair   HEEL SPUR EXCISION     JOINT REPLACEMENT     KNEE ARTHROSCOPY Left    Left Knee lateral release     LIPOMA EXCISION     SHOULDER ARTHROSCOPY WITH ROTATOR CUFF REPAIR AND SUBACROMIAL DECOMPRESSION Right 06/15/2019   Procedure: SHOULDER ARTHROSCOPY WITH  ROTATOR CUFF REPAIR AND SUBACROMIAL DECOMPRESSION;  Surgeon: Jodi Geralds, MD;  Location: [facility];  Service: Orthopedics;  Laterality: Right;   SVT ABLATION N/A 12/06/2020   Procedure: SVT ABLATION;  Surgeon: Regan Lemming, MD;  Location: [facility];  Service: Cardiovascular;  Laterality: N/A;   TONSILLECTOMY     TOTAL KNEE ARTHROPLASTY  08/03/2011   Procedure: TOTAL KNEE ARTHROPLASTY;  Surgeon: Harvie Junior;  Location: [facility];  Service: Orthopedics;  Laterality: Right;  COMPUTER ASSISTED TOTAL KNEE REPLACEMENTwith revision tibial  component   TUBAL LIGATION      Family History  Problem Relation Age of Onset   Hypertension Mother    Heart disease Mother    Heart attack Mother    Hyperlipidemia Mother    Stroke Mother    Obesity Mother    Cancer Father    Liver disease Father    Obesity Father    Anesthesia problems Neg Hx    Hypotension Neg Hx    Malignant hyperthermia Neg Hx    Pseudochol deficiency Neg Hx    Colon cancer Neg Hx    Colon polyps Neg Hx    Esophageal cancer Neg Hx    Rectal cancer Neg Hx    Stomach cancer Neg Hx    Breast cancer Neg Hx    Social History   Socioeconomic History   Marital status: Married    Spouse name: Rosselin Guerriero   Number of children: 2   Years of education: Not on file   Highest education level: Some college, no degree  Occupational History   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 kits  Tobacco Use   Smoking status: Never    Passive exposure: Never   Smokeless tobacco: Never  Vaping Use   Vaping Use: Never used  Substance and Sexual Activity   Alcohol use: Not Currently   Drug use: Never   Sexual activity: Yes  Other Topics Concern   Not on file  Social History Narrative   Not on file   Social Determinants of Health   Financial Resource Strain: Low Risk  (01/22/2023)   Overall Financial Resource Strain (CARDIA)    Difficulty of Paying Living Expenses: Not hard at all  Food Insecurity: No Food Insecurity (01/22/2023)    Hunger Vital Sign    Worried About Running Out of Food in the Last Year: Never true    Ran Out of Food in the Last Year: Never true  Transportation Needs: No Transportation Needs (01/22/2023)   PRAPARE - Administrator, Civil Service (Medical): No    Lack of Transportation (Non-Medical): No  Physical Activity: Insufficiently Active (01/22/2023)   Exercise Vital Sign    Days of Exercise per Week: 2 days    Minutes of Exercise per Session: 30 min  Stress: No Stress Concern Present (01/22/2023)   [employer] of Occupational Health - Occupational Stress Questionnaire    Feeling of Stress : Not at all  Social Connections: Moderately Integrated (01/22/2023)   Social Connection and Isolation Panel [NHANES]    Frequency of Communication with Friends and Family: Three times a week    Frequency of Social Gatherings with Friends and Family: Once a week    Attends Religious Services: More than 4 times per year    Active Member of [employer] or Organizations: No    Attends Engineer, structural: Never    Marital Status: Married    Objective:  BP 134/78   Pulse 80   Temp (!) 96.2 F (35.7 C)   Resp 18   Ht 5\' 3"  (1.6 m)   Wt 277 lb (125.6 kg)   BMI 49.07 kg/m      01/23/2023    7:53 AM 12/11/2022    8:37 AM 11/05/2022   10:24 AM  BP/Weight  Systolic BP 134 124 116  Diastolic BP 78 84 84  Wt. (Lbs) 277 279 279  BMI 49.07 kg/m2 49.42 kg/m2 52.72 kg/m2    Physical Exam Vitals reviewed.  Constitutional:  Appearance: Normal appearance. She is normal weight.  Neck:     Vascular: No carotid bruit.  Cardiovascular:     Rate and Rhythm: Normal rate. Rhythm irregular.     Heart sounds: Normal heart sounds.  Pulmonary:     Effort: Pulmonary effort is normal. No respiratory distress.     Breath sounds: Normal breath sounds.  Abdominal:     General: Abdomen is flat. Bowel sounds are normal.     Palpations: Abdomen is soft.     Tenderness: There is no abdominal  tenderness.  Skin:    General: Skin is warm and dry.     Comments: Stasis dermatitis lower legs.    Neurological:     Mental Status: She is alert and oriented to person, place, and time.  Psychiatric:        Mood and Affect: Mood normal.        Behavior: Behavior normal.     Diabetic Foot Exam - Simple   Simple Foot Form Diabetic Foot exam was performed with the following findings: Yes 01/23/2023  8:02 AM  Visual Inspection See comments: Yes Sensation Testing See comments: Yes Pulse Check Posterior Tibialis and Dorsalis pulse intact bilaterally: Yes Comments Callous right foot. Numbness and tingling right foot.       Lab Results  Component Value Date   WBC 11.0 (H) 01/23/2023   HGB 14.0 01/23/2023   HCT 42.6 01/23/2023   PLT 324 01/23/2023   GLUCOSE 108 (H) 01/23/2023   CHOL 184 01/23/2023   TRIG 205 (H) 01/23/2023   HDL 43 01/23/2023   LDLCALC 106 (H) 01/23/2023   ALT 27 01/23/2023   AST 31 01/23/2023   NA 141 01/23/2023   K 4.8 01/23/2023   CL 101 01/23/2023   CREATININE 0.84 01/23/2023   BUN 16 01/23/2023   CO2 25 01/23/2023   TSH 4.030 10/10/2022   INR 1.53 (H) 08/06/2011   HGBA1C 6.2 (H) 01/23/2023      Assessment & Plan:    Hypertensive heart disease without heart failure Assessment & Plan: Continue current medication. Valsartan-HYDROCHLOROTHIAZIDE 160/12.5 mg daily. Metoprolol succinate 25 mg daily.    Orders: -     CBC with Differential/Platelet -     Comprehensive metabolic panel  Class 3 severe obesity due to excess calories with serious comorbidity and body mass index (BMI) of 45.0 to 49.9 in adult [facility]) Assessment & Plan: Healthy diet and increase exercise.   Orders: -     Lipid panel  Impaired fasting glucose Assessment & Plan: Labs drawn today.    Orders: -     Hemoglobin A1c  Other secondary osteoarthritis of left knee Assessment & Plan: Well controlled.  No changes to medicines.  Continue to work on eating a healthy  diet and exercise.  Labs drawn today.        No orders of the defined types were placed in this encounter.   Orders Placed This Encounter  Procedures   CBC with Differential/Platelet   Comprehensive metabolic panel   Hemoglobin A1c   Lipid panel     Follow-up: Return in about 3 months (around 04/25/2023).   I,Marla I Leal-Borjas,acting as a scribe for Blane Ohara, MD.,have documented all relevant documentation on the behalf of Blane Ohara, MD,as directed by  Blane Ohara, MD while in the presence of Blane Ohara, MD.   An After Visit Summary was printed and given to the patient.  Blane Ohara, MD [facility] [phone]

## 2023-01-23 ENCOUNTER — Ambulatory Visit (INDEPENDENT_AMBULATORY_CARE_PROVIDER_SITE_OTHER): Payer: Medicare Other | Admitting: Family Medicine

## 2023-01-23 VITALS — BP 134/78 | HR 80 | Temp 96.2°F | Resp 18 | Ht 63.0 in | Wt 277.0 lb

## 2023-01-23 DIAGNOSIS — I4811 Longstanding persistent atrial fibrillation: Secondary | ICD-10-CM

## 2023-01-23 DIAGNOSIS — R7301 Impaired fasting glucose: Secondary | ICD-10-CM | POA: Diagnosis not present

## 2023-01-23 DIAGNOSIS — E782 Mixed hyperlipidemia: Secondary | ICD-10-CM

## 2023-01-23 DIAGNOSIS — Z6841 Body Mass Index (BMI) 40.0 and over, adult: Secondary | ICD-10-CM

## 2023-01-23 DIAGNOSIS — E88819 Insulin resistance, unspecified: Secondary | ICD-10-CM

## 2023-01-23 DIAGNOSIS — M175 Other unilateral secondary osteoarthritis of knee: Secondary | ICD-10-CM

## 2023-01-23 DIAGNOSIS — I119 Hypertensive heart disease without heart failure: Secondary | ICD-10-CM | POA: Diagnosis not present

## 2023-01-23 LAB — LIPID PANEL
Chol/HDL Ratio: 4.3 ratio (ref 0.0–4.4)
Cholesterol, Total: 184 mg/dL (ref 100–199)
HDL: 43 mg/dL (ref >39)
LDL Chol Calc (NIH): 106 mg/dL — ABNORMAL HIGH (ref 0–99)
Triglycerides: 205 mg/dL — ABNORMAL HIGH (ref 0–149)
VLDL Cholesterol Cal: 35 mg/dL (ref 5–40)

## 2023-01-23 LAB — CBC WITH DIFFERENTIAL/PLATELET
Basophils Absolute: 0.1 10*3/uL (ref 0.0–0.2)
Basos: 1 %
EOS (ABSOLUTE): 0.4 10*3/uL (ref 0.0–0.4)
Eos: 4 %
Hematocrit: 42.6 % (ref 34.0–46.6)
Hemoglobin: 14 g/dL (ref 11.1–15.9)
Immature Grans (Abs): 0.1 10*3/uL (ref 0.0–0.1)
Immature Granulocytes: 1 %
Lymphocytes Absolute: 3.9 10*3/uL — ABNORMAL HIGH (ref 0.7–3.1)
Lymphs: 36 %
MCH: 29.8 pg (ref 26.6–33.0)
MCHC: 32.9 g/dL (ref 31.5–35.7)
MCV: 91 fL (ref 79–97)
Monocytes Absolute: 0.7 10*3/uL (ref 0.1–0.9)
Monocytes: 7 %
Neutrophils Absolute: 5.8 10*3/uL (ref 1.4–7.0)
Neutrophils: 51 %
Platelets: 324 10*3/uL (ref 150–450)
RBC: 4.7 x10E6/uL (ref 3.77–5.28)
RDW: 13.5 % (ref 11.7–15.4)
WBC: 11 10*3/uL — ABNORMAL HIGH (ref 3.4–10.8)

## 2023-01-23 LAB — COMPREHENSIVE METABOLIC PANEL WITH GFR
ALT: 27 [IU]/L (ref 0–32)
AST: 31 [IU]/L (ref 0–40)
Albumin/Globulin Ratio: 1.5 (ref 1.2–2.2)
Albumin: 4.1 g/dL (ref 3.9–4.9)
Alkaline Phosphatase: 85 [IU]/L (ref 44–121)
BUN/Creatinine Ratio: 19 (ref 12–28)
BUN: 16 mg/dL (ref 8–27)
Bilirubin Total: 0.7 mg/dL (ref 0.0–1.2)
CO2: 25 mmol/L (ref 20–29)
Calcium: 9.6 mg/dL (ref 8.7–10.3)
Chloride: 101 mmol/L (ref 96–106)
Creatinine, Ser: 0.84 mg/dL (ref 0.57–1.00)
Globulin, Total: 2.7 g/dL (ref 1.5–4.5)
Glucose: 108 mg/dL — ABNORMAL HIGH (ref 70–99)
Potassium: 4.8 mmol/L (ref 3.5–5.2)
Sodium: 141 mmol/L (ref 134–144)
Total Protein: 6.8 g/dL (ref 6.0–8.5)
eGFR: 77 mL/min/{1.73_m2}

## 2023-01-23 LAB — HEMOGLOBIN A1C
Est. average glucose Bld gHb Est-mCnc: 131 mg/dL
Hgb A1c MFr Bld: 6.2 % — ABNORMAL HIGH (ref 4.8–5.6)

## 2023-01-23 NOTE — Assessment & Plan Note (Addendum)
Well controlled.  Continue current medication. Valsartan-HYDROCHLOROTHIAZIDE 160/12.5 mg daily. Metoprolol succinate 25 mg daily.

## 2023-01-23 NOTE — Assessment & Plan Note (Signed)
Healthy diet and increase exercise.

## 2023-01-23 NOTE — Assessment & Plan Note (Addendum)
Labs drawn today.   A1C INCREASED.  Start on metformin 500 mg daily.

## 2023-01-24 ENCOUNTER — Other Ambulatory Visit: Payer: Self-pay

## 2023-01-24 MED ORDER — ROSUVASTATIN CALCIUM 10 MG PO TABS: 10.0000 mg | ORAL_TABLET | Freq: Every day | ORAL | 0 refills | Status: DC

## 2023-01-24 MED ORDER — METFORMIN HCL 500 MG PO TABS: 500.0000 mg | ORAL_TABLET | Freq: Two times a day (BID) | ORAL | 0 refills | Status: DC

## 2023-01-25 NOTE — Assessment & Plan Note (Signed)
Well controlled.  ?No changes to medicines.  ?Continue to work on eating a healthy diet and exercise.  ?Labs drawn today.  ?

## 2023-01-29 ENCOUNTER — Encounter: Payer: Self-pay | Admitting: Family Medicine

## 2023-01-29 DIAGNOSIS — I4811 Longstanding persistent atrial fibrillation: Secondary | ICD-10-CM | POA: Insufficient documentation

## 2023-01-29 NOTE — Assessment & Plan Note (Signed)
Not currently in atrial fibrillation. Continue toprol xl.

## 2023-01-29 NOTE — Assessment & Plan Note (Signed)
Not at goal.  Start rosuvastatin 10 mg daily.  Continue to work on eating a healthy diet and exercise.  Labs drawn today.

## 2023-02-18 ENCOUNTER — Ambulatory Visit (INDEPENDENT_AMBULATORY_CARE_PROVIDER_SITE_OTHER): Payer: Medicare Other

## 2023-02-18 VITALS — BP 132/80

## 2023-02-18 DIAGNOSIS — Z1231 Encounter for screening mammogram for malignant neoplasm of breast: Secondary | ICD-10-CM

## 2023-02-18 DIAGNOSIS — Z0000 Encounter for general adult medical examination without abnormal findings: Secondary | ICD-10-CM | POA: Diagnosis not present

## 2023-02-18 NOTE — Patient Instructions (Signed)
Rebecca Rojas , Thank you for taking time to come for your Medicare Wellness Visit. I appreciate your ongoing commitment to your health goals. Please review the following plan we discussed and let me know if I can assist you in the future.   These are the goals we discussed:  Goals      Activity and Exercise Increased     Evidence-based guidance:  Review current exercise levels.  Assess patient perspective on exercise or activity level, barriers to increasing activity, motivation and readiness for change.  Recommend or set healthy exercise goal based on individual tolerance.  Encourage small steps toward making change in amount of exercise or activity.  Urge reduction of sedentary activities or screen time.  Promote group activities within the community or with family or support person.  Consider referral to rehabiliation therapist for assessment and exercise/activity plan.        Prevent falls     02/18/2023 AWV Goal: Fall Prevention  Over the next year, patient will decrease their risk for falls by: Using assistive devices, such as a cane or walker, as needed Identifying fall risks within their home and correcting them by: Removing throw rugs Adding handrails to stairs or ramps Removing clutter and keeping a clear pathway throughout the home Increasing light, especially at night Adding shower handles/bars Raising toilet seat Identifying potential personal risk factors for falls: Medication side effects Incontinence/urgency Vestibular dysfunction Hearing loss Musculoskeletal disorders Neurological disorders Orthostatic hypotension          This is a list of the screening recommended for you and due dates:  Health Maintenance  Topic Date Due   DTaP/Tdap/Td vaccine (2 - Td or Tdap) 10/01/2022   Flu Shot  03/21/2023   Medicare Annual Wellness Visit  02/18/2024   Mammogram  04/18/2024   Colon Cancer Screening  04/14/2030   Pneumonia Vaccine  Completed   DEXA scan (bone  density measurement)  Completed   Zoster (Shingles) Vaccine  Completed   HPV Vaccine  Aged Out   COVID-19 Vaccine  Discontinued   Hepatitis C Screening  Discontinued    Advanced directives: Please bring a copy for your medical record   Preventive Care 65 Years and Older, Female Preventive care refers to lifestyle choices and visits with your health care provider that can promote health and wellness. What does preventive care include? A yearly physical exam. This is also called an annual well check. Dental exams once or twice a year. Routine eye exams. Ask your health care provider how often you should have your eyes checked. Personal lifestyle choices, including: Daily care of your teeth and gums. Regular physical activity. Eating a healthy diet. Avoiding tobacco and drug use. Limiting alcohol use. Practicing safe sex. Taking low-dose aspirin every day. Taking vitamin and mineral supplements as recommended by your health care provider. What happens during an annual well check? The services and screenings done by your health care provider during your annual well check will depend on your age, overall health, lifestyle risk factors, and family history of disease. Counseling  Your health care provider may ask you questions about your: Alcohol use. Tobacco use. Drug use. Emotional well-being. Home and relationship well-being. Sexual activity. Eating habits. History of falls. Memory and ability to understand (cognition). Work and work Astronomer. Reproductive health. Screening  You may have the following tests or measurements: Height, weight, and BMI. Blood pressure. Lipid and cholesterol levels. These may be checked every 5 years, or more frequently if you are over  40 years old. Skin check. Lung cancer screening. You may have this screening every year starting at age 57 if you have a 30-pack-year history of smoking and currently smoke or have quit within the past 15  years. Fecal occult blood test (FOBT) of the stool. You may have this test every year starting at age 14. Flexible sigmoidoscopy or colonoscopy. You may have a sigmoidoscopy every 5 years or a colonoscopy every 10 years starting at age 80. Hepatitis C blood test. Hepatitis B blood test. Sexually transmitted disease (STD) testing. Diabetes screening. This is done by checking your blood sugar (glucose) after you have not eaten for a while (fasting). You may have this done every 1-3 years. Bone density scan. This is done to screen for osteoporosis. You may have this done starting at age 21. Mammogram. This may be done every 1-2 years. Talk to your health care provider about how often you should have regular mammograms. Talk with your health care provider about your test results, treatment options, and if necessary, the need for more tests. Vaccines  Your health care provider may recommend certain vaccines, such as: Influenza vaccine. This is recommended every year. Tetanus, diphtheria, and acellular pertussis (Tdap, Td) vaccine. You may need a Td booster every 10 years. Zoster vaccine. You may need this after age 41. Pneumococcal 13-valent conjugate (PCV13) vaccine. One dose is recommended after age 88. Pneumococcal polysaccharide (PPSV23) vaccine. One dose is recommended after age 19. Talk to your health care provider about which screenings and vaccines you need and how often you need them. This information is not intended to replace advice given to you by your health care provider. Make sure you discuss any questions you have with your health care provider. Document Released: 09/02/2015 Document Revised: 04/25/2016 Document Reviewed: 06/07/2015 Elsevier Interactive Patient Education  2017 [employer].  Fall Prevention in the Home Falls can cause injuries. They can happen to people of all ages. There are many things you can do to make your home safe and to help prevent falls. What can I do on  the outside of my home? Regularly fix the edges of walkways and driveways and fix any cracks. Remove anything that might make you trip as you walk through a door, such as a raised step or threshold. Trim any bushes or trees on the path to your home. Use bright outdoor lighting. Clear any walking paths of anything that might make someone trip, such as rocks or tools. Regularly check to see if handrails are loose or broken. Make sure that both sides of any steps have handrails. Any raised decks and porches should have guardrails on the edges. Have any leaves, snow, or ice cleared regularly. Use sand or salt on walking paths during winter. Clean up any spills in your garage right away. This includes oil or grease spills. What can I do in the bathroom? Use night lights. Install grab bars by the toilet and in the tub and shower. Do not use towel bars as grab bars. Use non-skid mats or decals in the tub or shower. If you need to sit down in the shower, use a plastic, non-slip stool. Keep the floor dry. Clean up any water that spills on the floor as soon as it happens. Remove soap buildup in the tub or shower regularly. Attach bath mats securely with double-sided non-slip rug tape. Do not have throw rugs and other things on the floor that can make you trip. What can I do in the bedroom?  Use night lights. Make sure that you have a light by your bed that is easy to reach. Do not use any sheets or blankets that are too big for your bed. They should not hang down onto the floor. Have a firm chair that has side arms. You can use this for support while you get dressed. Do not have throw rugs and other things on the floor that can make you trip. What can I do in the kitchen? Clean up any spills right away. Avoid walking on wet floors. Keep items that you use a lot in easy-to-reach places. If you need to reach something above you, use a strong step stool that has a grab bar. Keep electrical cords out  of the way. Do not use floor polish or wax that makes floors slippery. If you must use wax, use non-skid floor wax. Do not have throw rugs and other things on the floor that can make you trip. What can I do with my stairs? Do not leave any items on the stairs. Make sure that there are handrails on both sides of the stairs and use them. Fix handrails that are broken or loose. Make sure that handrails are as long as the stairways. Check any carpeting to make sure that it is firmly attached to the stairs. Fix any carpet that is loose or worn. Avoid having throw rugs at the top or bottom of the stairs. If you do have throw rugs, attach them to the floor with carpet tape. Make sure that you have a light switch at the top of the stairs and the bottom of the stairs. If you do not have them, ask someone to add them for you. What else can I do to help prevent falls? Wear shoes that: Do not have high heels. Have rubber bottoms. Are comfortable and fit you well. Are closed at the toe. Do not wear sandals. If you use a stepladder: Make sure that it is fully opened. Do not climb a closed stepladder. Make sure that both sides of the stepladder are locked into place. Ask someone to hold it for you, if possible. Clearly mark and make sure that you can see: Any grab bars or handrails. First and last steps. Where the edge of each step is. Use tools that help you move around (mobility aids) if they are needed. These include: Canes. Walkers. Scooters. Crutches. Turn on the lights when you go into a dark area. Replace any light bulbs as soon as they burn out. Set up your furniture so you have a clear path. Avoid moving your furniture around. If any of your floors are uneven, fix them. If there are any pets around you, be aware of where they are. Review your medicines with your doctor. Some medicines can make you feel dizzy. This can increase your chance of falling. Ask your doctor what other things that  you can do to help prevent falls. This information is not intended to replace advice given to you by your health care provider. Make sure you discuss any questions you have with your health care provider. Document Released: 06/02/2009 Document Revised: 01/12/2016 Document Reviewed: 09/10/2014 Elsevier Interactive Patient Education  2017 [employer].

## 2023-02-18 NOTE — Progress Notes (Signed)
Subjective:   Rebecca Rojas is a 67 y.o. female who presents for Medicare Annual (Subsequent) preventive examination.  This wellness visit is conducted by a nurse.  The patient's medications were reviewed and reconciled since the patient's last visit.  History details were provided by the patient.  The history appears to be reliable.    Medical History: Patient history and Family history was reviewed  Medications, Allergies, and preventative health maintenance was reviewed and updated.   Visit Complete: Virtual  I connected with  [name] on 02/18/23 by a audio enabled telemedicine application and verified that I am speaking with the correct person using two identifiers.  Patient Location: Home  Provider Location: Office/Clinic  I discussed the limitations of evaluation and management by telemedicine. The patient expressed understanding and agreed to proceed.  Cardiac Risk Factors include: obesity (BMI >30kg/m2)     Objective:    Today's Vitals   02/18/23 1404  BP: 132/80  PainSc: 0-No pain   There is no height or weight on file to calculate BMI.     12/06/2020    8:10 AM 06/15/2019   10:37 AM 06/12/2019    3:19 PM 03/07/2019   10:23 AM 07/30/2011    1:18 PM  Advanced Directives  Does Patient Have a Medical Advance Directive? No No No No Patient does not have advance directive  Would patient like information on creating a medical advance directive? No - Patient declined No - Patient declined     Pre-existing out of facility DNR order (yellow form or pink MOST form)     No    Current Medications (verified) Outpatient Encounter Medications as of 02/18/2023  Medication Sig   albuterol (PROVENTIL HFA;VENTOLIN HFA) 108 (90 BASE) MCG/ACT inhaler Inhale 1-2 puffs into the lungs every 6 (six) hours as needed for wheezing or shortness of breath.    aspirin EC 81 MG tablet Take 81 mg by mouth daily. Swallow whole.   azelastine (OPTIVAR) 0.05 % ophthalmic solution Place 1  drop into both eyes 2 (two) times daily as needed for allergies (allergies).   cyclobenzaprine (FLEXERIL) 10 MG tablet Take 10 mg by mouth 3 (three) times daily as needed.   EPINEPHrine 0.3 mg/0.3 mL IJ SOAJ injection Inject 0.3 mg into the muscle as needed for anaphylaxis.   etodolac (LODINE) 400 MG tablet Take 1 tablet (400 mg total) by mouth as needed.   gabapentin (NEURONTIN) 300 MG capsule Take 1 capsule (300 mg total) by mouth 3 (three) times daily.   ipratropium (ATROVENT) 0.02 % nebulizer solution Take 3 mLs by nebulization every 6 (six) hours as needed for wheezing or shortness of breath.   levocetirizine (XYZAL) 5 MG tablet Take 5 mg by mouth every evening.   metoprolol succinate (TOPROL-XL) 25 MG 24 hr tablet Take 1 tablet (25 mg total) by mouth daily.   montelukast (SINGULAIR) 10 MG tablet Take 10 mg by mouth at bedtime.   rosuvastatin (CRESTOR) 10 MG tablet Take 1 tablet (10 mg total) by mouth daily.   valsartan-hydrochlorothiazide (DIOVAN-HCT) 160-12.5 MG tablet Take 1 tablet by mouth daily.   Vitamin D, Ergocalciferol, (DRISDOL) 1.25 MG (50000 UNIT) CAPS capsule Take 1 capsule (50,000 Units total) by mouth every 7 (seven) days.   WIXELA INHUB 250-50 MCG/ACT AEPB Inhale 1 puff into the lungs 2 (two) times daily.   [DISCONTINUED] metFORMIN (GLUCOPHAGE) 500 MG tablet Take 1 tablet (500 mg total) by mouth 2 (two) times daily with a meal.  No facility-administered encounter medications on file as of 02/18/2023.    Allergies (verified) Sulfa antibiotics, Metformin and related, Cefdinir, Erythromycin, and Hydrocodone bit-homatrop mbr   History: Past Medical History:  Diagnosis Date   Allergic rhinitis 02/15/2021   Allergic rhinitis due to animal (cat) (dog) hair and dander 02/15/2021   Allergic rhinitis due to pollen 02/15/2021   Allergy    dust, environmental, feathers, has epipen, prn   Allergy-induced asthma    Anemia    Arthritis    osteoarthritis   Arthritis of right  acromioclavicular joint 06/15/2019   Atrial fibrillation (HCC) 05/20/2020   Back pain    Benign essential hypertension 12/28/2015   Bilateral lower extremity edema 08/04/2018   Chronic allergic conjunctivitis 02/15/2021   Glenoid labral tear, right, initial encounter 06/15/2019   Headache    Hordeolum internum of right lower eyelid 02/14/2017   Hypertensive heart disease 06/02/2020   Insulin resistance 10/01/2019   Joint pain    Migraine headache 12/28/2015   Mixed hyperlipidemia 12/28/2015   Morbid obesity (HCC)    Myalgia 12/28/2015   Osteoarthritis    Rotator cuff tear, right 06/15/2019   Skin lesion of foot 08/30/2016   Sleep apnea    uses cpap   Vertigo    Vitamin D deficiency    Past Surgical History:  Procedure Laterality Date   ABDOMINAL HYSTERECTOMY     COLONOSCOPY  09/2010   normal   FRACTURE SURGERY     left 5th finger fx with bone graft repair   HEEL SPUR EXCISION     JOINT REPLACEMENT     KNEE ARTHROSCOPY Left    Left Knee lateral release     LIPOMA EXCISION     SHOULDER ARTHROSCOPY WITH ROTATOR CUFF REPAIR AND SUBACROMIAL DECOMPRESSION Right 06/15/2019   Procedure: SHOULDER ARTHROSCOPY WITH ROTATOR CUFF REPAIR AND SUBACROMIAL DECOMPRESSION;  Surgeon: Jodi Geralds, MD;  Location: [facility];  Service: Orthopedics;  Laterality: Right;   SVT ABLATION N/A 12/06/2020   Procedure: SVT ABLATION;  Surgeon: Regan Lemming, MD;  Location: [facility];  Service: Cardiovascular;  Laterality: N/A;   TONSILLECTOMY     TOTAL KNEE ARTHROPLASTY  08/03/2011   Procedure: TOTAL KNEE ARTHROPLASTY;  Surgeon: Harvie Junior;  Location: [facility];  Service: Orthopedics;  Laterality: Right;  COMPUTER ASSISTED TOTAL KNEE REPLACEMENTwith revision tibial component   TUBAL LIGATION     Family History  Problem Relation Age of Onset   Hypertension Mother    Heart disease Mother    Heart attack Mother    Hyperlipidemia Mother    Stroke Mother    Obesity Mother    Cancer Father     Liver disease Father    Obesity Father    Anesthesia problems Neg Hx    Hypotension Neg Hx    Malignant hyperthermia Neg Hx    Pseudochol deficiency Neg Hx    Colon cancer Neg Hx    Colon polyps Neg Hx    Esophageal cancer Neg Hx    Rectal cancer Neg Hx    Stomach cancer Neg Hx    Breast cancer Neg Hx    Social History   Socioeconomic History   Marital status: Married    Spouse name: Wendelyn Cullipher   Number of children: 2   Years of education: Not on file   Highest education level: Some college, no degree  Occupational History   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 kits  Tobacco Use   Smoking status: Never  Passive exposure: Never   Smokeless tobacco: Never  Vaping Use   Vaping Use: Never used  Substance and Sexual Activity   Alcohol use: Never   Drug use: Never   Sexual activity: Yes  Other Topics Concern   Not on file  Social History Narrative   Not on file   Social Determinants of Health   Financial Resource Strain: Low Risk  (01/22/2023)   Overall Financial Resource Strain (CARDIA)    Difficulty of Paying Living Expenses: Not hard at all  Food Insecurity: No Food Insecurity (01/22/2023)   Hunger Vital Sign    Worried About Running Out of Food in the Last Year: Never true    Ran Out of Food in the Last Year: Never true  Transportation Needs: No Transportation Needs (01/22/2023)   PRAPARE - Administrator, Civil Service (Medical): No    Lack of Transportation (Non-Medical): No  Physical Activity: Insufficiently Active (02/18/2023)   Exercise Vital Sign    Days of Exercise per Week: 2 days    Minutes of Exercise per Session: 40 min  Stress: No Stress Concern Present (01/22/2023)   [employer] of Occupational Health - Occupational Stress Questionnaire    Feeling of Stress : Not at all  Social Connections: Moderately Integrated (01/22/2023)   Social Connection and Isolation Panel [NHANES]    Frequency of Communication with Friends and Family: Three  times a week    Frequency of Social Gatherings with Friends and Family: Once a week    Attends Religious Services: More than 4 times per year    Active Member of [employer] or Organizations: No    Attends Engineer, structural: Never    Marital Status: Married    Tobacco Counseling Counseling given: Not Answered   Clinical Intake:  Pre-visit preparation completed: Yes Pain : No/denies pain Pain Score: 0-No pain   BMI - recorded: 49.08 Nutritional Status: BMI > 30  Obese Nutritional Risks: None Diabetes: No How often do you need to have someone help you when you read instructions, pamphlets, or other written materials from your doctor or pharmacy?: 1 - Never Interpreter Needed?: No     Activities of Daily Living    02/18/2023    2:10 PM  In your present state of health, do you have any difficulty performing the following activities:  Hearing? 0  Vision? 0  Difficulty concentrating or making decisions? 0  Walking or climbing stairs? 0  Dressing or bathing? 0  Doing errands, shopping? 0  Preparing Food and eating ? N  Using the Toilet? N  In the past six months, have you accidently leaked urine? N  Do you have problems with loss of bowel control? N  Managing your Medications? N  Managing your Finances? N  Housekeeping or managing your Housekeeping? N    Patient Care Team: Blane Ohara, MD as PCP - General (Family Medicine) Regan Lemming, MD as PCP - Electrophysiology (Cardiology) Regan Lemming, MD as PCP - Cardiology (Cardiology) Coralyn Helling, MD as Consulting Physician (Pulmonary Disease) Sidney Ace, MD as Referring Physician (Allergy)     Assessment:   This is a routine wellness examination for Daley.   Dietary issues and exercise activities discussed:     Goals Addressed             This Visit's Progress    Activity and Exercise Increased       Evidence-based guidance:  Review current exercise levels.  Assess patient  perspective on exercise or activity level, barriers to increasing activity, motivation and readiness for change.  Recommend or set healthy exercise goal based on individual tolerance.  Encourage small steps toward making change in amount of exercise or activity.  Urge reduction of sedentary activities or screen time.  Promote group activities within the community or with family or support person.  Consider referral to rehabiliation therapist for assessment and exercise/activity plan.        Prevent falls       02/18/2023 AWV Goal: Fall Prevention  Over the next year, patient will decrease their risk for falls by: Using assistive devices, such as a cane or walker, as needed Identifying fall risks within their home and correcting them by: Removing throw rugs Adding handrails to stairs or ramps Removing clutter and keeping a clear pathway throughout the home Increasing light, especially at night Adding shower handles/bars Raising toilet seat Identifying potential personal risk factors for falls: Medication side effects Incontinence/urgency Vestibular dysfunction Hearing loss Musculoskeletal disorders Neurological disorders Orthostatic hypotension         Depression Screen    02/18/2023    2:09 PM 01/23/2023    7:55 AM 10/10/2022    9:52 AM 06/18/2022    8:15 AM 09/11/2021    7:55 AM 11/15/2020    2:59 PM 09/01/2019    8:35 AM  PHQ 2/9 Scores  PHQ - 2 Score 0 0 0 0 0 0 3  PHQ- 9 Score 0 0     15    Fall Risk    02/18/2023    2:10 PM 01/23/2023    7:55 AM 10/10/2022    9:52 AM 07/02/2022   10:30 AM 06/18/2022    8:15 AM  Fall Risk   Falls in the past year? 0 0 0 0 0  Number falls in past yr: 0 0 0 0 0  Injury with Fall? 0 0 0 0 0  Risk for fall due to : No Fall Risks No Fall Risks  No Fall Risks No Fall Risks  Follow up Falls evaluation completed;Education provided Falls evaluation completed;Falls prevention discussed  Falls evaluation completed Falls evaluation completed     MEDICARE RISK AT HOME:  Medicare Risk at Home - 02/18/23 1358     Any stairs in or around the home? No    If so, are there any without handrails? No    Home free of loose throw rugs in walkways, pet beds, electrical cords, etc? Yes    Adequate lighting in your home to reduce risk of falls? Yes    Life alert? No    Use of a cane, walker or w/c? No    Grab bars in the bathroom? Yes    Shower chair or bench in shower? No    Elevated toilet seat or a handicapped toilet? No             TIMED UP AND GO:  Was the test performed?  No    Cognitive Function:        02/18/2023    2:10 PM  6CIT Screen  What Year? 0 points  What month? 0 points  What time? 0 points  Count back from 20 0 points  Months in reverse 0 points  Repeat phrase 0 points  Total Score 0 points    Immunizations Immunization History  Administered Date(s) Administered   Influenza Inj Mdck Quad Pf 04/20/2019, 05/23/2020   Influenza, High Dose Seasonal PF 10/03/2021, 05/08/2022   Influenza,inj,Quad  PF,6+ Mos 05/24/2010, 04/18/2018   Influenza,inj,quad, With Preservative 05/23/2020   Influenza-Unspecified 08/29/2016, 04/18/2018, 09/05/2021   PFIZER(Purple Top)SARS-COV-2 Vaccination 11/07/2019, 11/27/2019, 12/28/2019, 03/20/2021   PNEUMOCOCCAL CONJUGATE-20 12/11/2021   Pfizer Covid-19 Vaccine Bivalent Booster 72yrs & up 09/05/2021, 05/08/2022   Pneumococcal Conjugate-13 12/29/2015   Pneumococcal Polysaccharide-23 06/24/2009, 10/03/2021   Tdap 10/01/2012   Zoster Recombinant(Shingrix) 02/15/2021, 04/25/2021   Zoster, Unspecified 01/19/2018    TDAP status: Due, Education has been provided regarding the importance of this vaccine. Advised may receive this vaccine at local pharmacy or Health Dept. Aware to provide a copy of the vaccination record if obtained from local pharmacy or Health Dept. Verbalized acceptance and understanding.  Flu Vaccine status: Up to date  Pneumococcal vaccine status: Up to  date  Covid-19 vaccine status: Completed vaccines  Qualifies for Shingles Vaccine? Yes   Zostavax completed No   Shingrix Completed?: Yes  Screening Tests Health Maintenance  Topic Date Due   Medicare Annual Wellness (AWV)  Never done   DTaP/Tdap/Td (2 - Td or Tdap) 10/01/2022   INFLUENZA VACCINE  03/21/2023   MAMMOGRAM  04/18/2024   Colonoscopy  04/14/2030   Pneumonia Vaccine 6+ Years old  Completed   DEXA SCAN  Completed   Zoster Vaccines- Shingrix  Completed   HPV VACCINES  Aged Out   COVID-19 Vaccine  Discontinued   Hepatitis C Screening  Discontinued    Health Maintenance  Health Maintenance Due  Topic Date Due   Medicare Annual Wellness (AWV)  Never done   DTaP/Tdap/Td (2 - Td or Tdap) 10/01/2022    Colorectal cancer screening: Type of screening: Colonoscopy. Completed 03/2020. Repeat every 10 years  Mammogram status: Completed 03/2022. Repeat every year  Bone Density status: Completed 03/2021. Results reflect: Bone density results: NORMAL. Repeat every 2 years.  Lung Cancer Screening: (Low Dose CT Chest recommended if Age 51-80 years, 20 pack-year currently smoking OR have quit w/in 15years.) does not qualify.   Lung Cancer Screening Referral: N/A  Additional Screening:   Vision Screening: Recommended annual ophthalmology exams for early detection of glaucoma and other disorders of the eye. Is the patient up to date with their annual eye exam?  Yes  Who is the provider or what is the name of the office in which the patient attends annual eye exams? Dr Francesca Oman  Dental Screening: Recommended annual dental exams for proper oral hygiene  Community Resource Referral / Chronic Care Management: CRR required this visit?  No   CCM required this visit?  No     Plan:    1- Tetanus booster and RSV recommended 2- Mammogram ordered for  05/01/23 on the bus 3- Aim for 30 minutes of exercise or brisk walking, 6-8 glasses of water, and 5 servings of fruits and  vegetables each day.  4- Patient stopped taking Metformin after 2 weeks due to brain fog - she is working on managing her A1C with diet and exercise  I have personally reviewed and noted the following in the patient's chart:   Medical and social history Use of alcohol, tobacco or illicit drugs  Current medications and supplements including opioid prescriptions. Patient is not currently taking opioid prescriptions. Functional ability and status Nutritional status Physical activity Advanced directives List of other physicians Hospitalizations, surgeries, and ER visits in previous 12 months Vitals Screenings to include cognitive, depression, and falls Referrals and appointments  In addition, I have reviewed and discussed with patient certain preventive protocols, quality metrics, and best practice recommendations. A written  personalized care plan for preventive services as well as general preventive health recommendations were provided to patient.     ALEGRIA STAKES, LPN   11/24/4257   After Visit Summary: (MyChart) Due to this being a telephonic visit, the after visit summary with patients personalized plan was offered to patient via MyChart

## 2023-02-20 ENCOUNTER — Encounter: Payer: Self-pay | Admitting: Nurse Practitioner

## 2023-02-20 ENCOUNTER — Ambulatory Visit (INDEPENDENT_AMBULATORY_CARE_PROVIDER_SITE_OTHER): Payer: Medicare Other | Admitting: Nurse Practitioner

## 2023-02-20 VITALS — BP 116/70 | HR 88 | Ht 62.0 in | Wt 271.6 lb

## 2023-02-20 DIAGNOSIS — G4730 Sleep apnea, unspecified: Secondary | ICD-10-CM

## 2023-02-20 DIAGNOSIS — J4520 Mild intermittent asthma, uncomplicated: Secondary | ICD-10-CM | POA: Diagnosis not present

## 2023-02-20 NOTE — Assessment & Plan Note (Signed)
Compensated on current regimen. Follow up with Dr. Westervelt Callas as scheduled.

## 2023-02-20 NOTE — Progress Notes (Signed)
@Patient  ID: Rebecca Rojas, female    DOB: 11-12-1955, 67 y.o.   MRN: [id number]  Chief Complaint  Patient presents with   Follow-up    Cpap f/u     Referring provider: Blane Ohara, MD  HPI: 67 year old female, never smoker followed for obstructive sleep apnea on CPAP.  She is a patient Dr. Evlyn Courier and last seen in office on 12/11/2022.  Past medical history significant for asthma follwoed by Dr. [place] Callas, hypertension, migraines, A-fib not on anticoagulation therapy, allergic rhinitis, osteoarthritis, morbid obesity, vitamin D deficiency, HLD, vertigo.  TEST/EVENTS:  07/28/2015 sleep study: AHI 15.2, SPO2 nadir 79%  12/11/2022: OV with Dr. Craige Cotta.  She had to use a Respironics machine that broke.  Finally received a replacement device.  She was using her husband's old machine in the interim.  Purchasing her own supplies on [place] because she never received follow-up from previous DME.  Has nasal pillow mask.  No issues with breathing, sinus congestion, cough or chest congestion.  Compliant with CPAP and reports benefit from use.  Needs a new DME.  Also needs a new CPAP device.  Orders placed for ResMed CPAP at night cmH2O.  02/20/2023: Today-follow-up Patient presents today for follow-up after receiving new CPAP machine.  She is doing well with this.  No concerns or complaints.  She has missed some nights when her husband and her have gone on vacation.  The cabin that they typically stay in is limited on power outlets and her husband is also on a CPAP.  They tend to switch off using his CPAP.  She receives benefit from use.  Denies any drowsy driving or morning headaches.  No significant leaks.  Asthma has been doing well.  Follows with Dr. [place] Callas.  They switched her to [place] due to insurance issues.  01/21/2023-02/19/2023: CPAP 9 cmH2O 24/30 days; 73% >4 hr; av use 6 hr 48 min Leaks 95th 6.4 AHI 0.2  Allergies  Allergen Reactions   Sulfa Antibiotics Itching   Metformin And Related Other (See  Comments)    Caused brain fog   Cefdinir Itching   Erythromycin    Hydrocodone Bit-Homatrop Mbr Rash    Immunization History  Administered Date(s) Administered   Influenza Inj Mdck Quad Pf 04/20/2019, 05/23/2020   Influenza, High Dose Seasonal PF 10/03/2021, 05/08/2022   Influenza,inj,Quad PF,6+ Mos 05/24/2010, 04/18/2018   Influenza,inj,quad, With Preservative 05/23/2020   Influenza-Unspecified 08/29/2016, 04/18/2018, 09/05/2021   PFIZER(Purple Top)SARS-COV-2 Vaccination 11/07/2019, 11/27/2019, 12/28/2019, 03/20/2021   PNEUMOCOCCAL CONJUGATE-20 12/11/2021   Pfizer Covid-19 Vaccine Bivalent Booster 65yrs & up 09/05/2021, 05/08/2022   Pneumococcal Conjugate-13 12/29/2015   Pneumococcal Polysaccharide-23 06/24/2009, 10/03/2021   Tdap 10/01/2012, 02/18/2023   Zoster Recombinant(Shingrix) 02/15/2021, 04/25/2021   Zoster, Unspecified 01/19/2018    Past Medical History:  Diagnosis Date   Allergic rhinitis 02/15/2021   Allergic rhinitis due to animal (cat) (dog) hair and dander 02/15/2021   Allergic rhinitis due to pollen 02/15/2021   Allergy    dust, environmental, feathers, has epipen, prn   Allergy-induced asthma    Anemia    Arthritis    osteoarthritis   Arthritis of right acromioclavicular joint 06/15/2019   Atrial fibrillation (HCC) 05/20/2020   Back pain    Benign essential hypertension 12/28/2015   Bilateral lower extremity edema 08/04/2018   Chronic allergic conjunctivitis 02/15/2021   Glenoid labral tear, right, initial encounter 06/15/2019   Headache    Hordeolum internum of right lower eyelid 02/14/2017   Hypertensive heart disease 06/02/2020  Insulin resistance 10/01/2019   Joint pain    Migraine headache 12/28/2015   Mixed hyperlipidemia 12/28/2015   Morbid obesity (HCC)    Myalgia 12/28/2015   Osteoarthritis    Rotator cuff tear, right 06/15/2019   Skin lesion of foot 08/30/2016   Sleep apnea    uses cpap   Vertigo    Vitamin D deficiency      Tobacco History: Social History   Tobacco Use  Smoking Status Never   Passive exposure: Never  Smokeless Tobacco Never   Counseling given: Not Answered   Outpatient Medications Prior to Visit  Medication Sig Dispense Refill   albuterol (PROVENTIL HFA;VENTOLIN HFA) 108 (90 BASE) MCG/ACT inhaler Inhale 1-2 puffs into the lungs every 6 (six) hours as needed for wheezing or shortness of breath.      aspirin EC 81 MG tablet Take 81 mg by mouth daily. Swallow whole.     azelastine (OPTIVAR) 0.05 % ophthalmic solution Place 1 drop into both eyes 2 (two) times daily as needed for allergies (allergies).     cyclobenzaprine (FLEXERIL) 10 MG tablet Take 10 mg by mouth 3 (three) times daily as needed.     EPINEPHrine 0.3 mg/0.3 mL IJ SOAJ injection Inject 0.3 mg into the muscle as needed for anaphylaxis.     etodolac (LODINE) 400 MG tablet Take 1 tablet (400 mg total) by mouth as needed. 30 tablet 2   gabapentin (NEURONTIN) 300 MG capsule Take 1 capsule (300 mg total) by mouth 3 (three) times daily. 180 capsule 1   ipratropium (ATROVENT) 0.02 % nebulizer solution Take 3 mLs by nebulization every 6 (six) hours as needed for wheezing or shortness of breath.     levocetirizine (XYZAL) 5 MG tablet Take 5 mg by mouth every evening.     metoprolol succinate (TOPROL-XL) 25 MG 24 hr tablet Take 1 tablet (25 mg total) by mouth daily. 90 tablet 3   montelukast (SINGULAIR) 10 MG tablet Take 10 mg by mouth at bedtime.     rosuvastatin (CRESTOR) 10 MG tablet Take 1 tablet (10 mg total) by mouth daily. 90 tablet 0   valsartan-hydrochlorothiazide (DIOVAN-HCT) 160-12.5 MG tablet Take 1 tablet by mouth daily. 90 tablet 1   Vitamin D, Ergocalciferol, (DRISDOL) 1.25 MG (50000 UNIT) CAPS capsule Take 1 capsule (50,000 Units total) by mouth every 7 (seven) days. 12 capsule 1   WIXELA INHUB 250-50 MCG/ACT AEPB Inhale 1 puff into the lungs 2 (two) times daily.     No facility-administered medications prior to visit.      Review of Systems:   Constitutional: No weight loss or gain, night sweats, fevers, chills, fatigue, or lassitude. HEENT: No headaches, difficulty swallowing, tooth/dental problems, or sore throat. No sneezing, itching, ear ache, nasal congestion, or post nasal drip CV:  No chest pain, orthopnea, PND, swelling in lower extremities, anasarca, dizziness, palpitations, syncope Resp: No shortness of breath with exertion or at rest. No excess mucus or change in color of mucus. No productive or non-productive. No hemoptysis. No wheezing.  No chest wall deformity Skin: No rash, lesions, ulcerations MSK:  No joint swelling.  No decreased range of motion.  No back pain. Neuro: No dizziness or lightheadedness.  Psych: No depression or anxiety. Mood stable.     Physical Exam:  BP 116/70   Pulse 88   Ht 5\' 2"  (1.575 m)   Wt 271 lb 9.6 oz (123.2 kg)   SpO2 98%   BMI 49.68 kg/m   GEN:  Pleasant, interactive, well-appearing; morbidly obese; in no acute distress. HEENT:  Normocephalic and atraumatic. PERRLA. Sclera white. Nasal turbinates pink, moist and patent bilaterally. No rhinorrhea present. Oropharynx pink and moist, without exudate or edema. No lesions, ulcerations, or postnasal drip.  NECK:  Supple w/ fair ROM. No JVD present. Normal carotid impulses w/o bruits. Thyroid symmetrical with no goiter or nodules palpated. No lymphadenopathy.   CV: RRR, no m/r/g, no peripheral edema. Pulses intact, +2 bilaterally. No cyanosis, pallor or clubbing. PULMONARY:  Unlabored, regular breathing. Clear bilaterally A&P w/o wheezes/rales/rhonchi. No accessory muscle use. No dullness to percussion. GI: BS present and normoactive. Soft, non-tender to palpation.  Neuro: A/Ox3. No focal deficits noted.   Skin: Warm, no lesions or rashe Psych: Normal affect and behavior. Judgement and thought content appropriate.     Lab Results:  CBC    Component Value Date/Time   WBC 11.0 (H) 01/23/2023 0831   WBC  9.4 06/12/2019 1531   RBC 4.70 01/23/2023 0831   RBC 4.68 06/12/2019 1531   HGB 14.0 01/23/2023 0831   HCT 42.6 01/23/2023 0831   PLT 324 01/23/2023 0831   MCV 91 01/23/2023 0831   MCH 29.8 01/23/2023 0831   MCH 29.5 06/12/2019 1531   MCHC 32.9 01/23/2023 0831   MCHC 31.4 06/12/2019 1531   RDW 13.5 01/23/2023 0831   LYMPHSABS 3.9 (H) 01/23/2023 0831   MONOABS 0.7 02/11/2013 1810   EOSABS 0.4 01/23/2023 0831   BASOSABS 0.1 01/23/2023 0831    BMET    Component Value Date/Time   NA 141 01/23/2023 0831   K 4.8 01/23/2023 0831   CL 101 01/23/2023 0831   CO2 25 01/23/2023 0831   GLUCOSE 108 (H) 01/23/2023 0831   GLUCOSE 92 02/11/2013 1810   BUN 16 01/23/2023 0831   CREATININE 0.84 01/23/2023 0831   CALCIUM 9.6 01/23/2023 0831   GFRNONAA 93 06/13/2020 0936   GFRAA 107 06/13/2020 0936    BNP No results found for: "BNP"   Imaging:  No results found.        No data to display          No results found for: "NITRICOXIDE"      Assessment & Plan:   Sleep apnea with use of continuous positive airway pressure (CPAP) Moderate OSA, on CPAP.  She has good compliance but will miss a night every so often when she goes on vacation.  Recommended that she and her husband take a power strip so they can both plug-in their CPAP machines.  She was agreeable to trying this.  Encouraged her to increase usage as able.  Receives benefit from use.  Excellent control on download without any significant leaks.  Aware of safe driving practices.  Patient Instructions  Continue Albuterol inhaler 2 puffs every 6 hours as needed for shortness of breath or wheezing. Notify if symptoms persist despite rescue inhaler/neb use. Continue Wixela 1 puff Twice daily. Brush tongue and rinse mouth afterwards -Continue singulair 10 mg At bedtime -Continue Xyzal 5 mg daily   Continue to use CPAP every night, minimum of 6 hours a night.  Change equipment every 30 days or as directed by DME. Wash your  tubing with warm soap and water daily, hang to dry. Wash humidifier portion weekly. Use bottled, distilled water and change daily  Be aware of reduced alertness and do not drive or operate heavy machinery if experiencing this or drowsiness.  Notify if persistent daytime sleepiness occurs even with consistent use of  CPAP.   Follow up in one year with Dr. Craige Cotta. If symptoms do not improve or worsen, please contact office for sooner follow up or seek emergency care.   Allergy-induced asthma Compensated on current regimen. Follow up with Dr. [place] Callas as scheduled.     I spent 28 minutes of dedicated to the care of this patient on the date of this encounter to include pre-visit review of records, face-to-face time with the patient discussing conditions above, post visit ordering of testing, clinical documentation with the electronic health record, making appropriate referrals as documented, and communicating necessary findings to members of the patients care team.  Noemi Chapel, NP 02/20/2023  Pt aware and understands NP's role.

## 2023-02-20 NOTE — Assessment & Plan Note (Signed)
Moderate OSA, on CPAP.  She has good compliance but will miss a night every so often when she goes on vacation.  Recommended that she and her husband take a power strip so they can both plug-in their CPAP machines.  She was agreeable to trying this.  Encouraged her to increase usage as able.  Receives benefit from use.  Excellent control on download without any significant leaks.  Aware of safe driving practices.  Patient Instructions  Continue Albuterol inhaler 2 puffs every 6 hours as needed for shortness of breath or wheezing. Notify if symptoms persist despite rescue inhaler/neb use. Continue Wixela 1 puff Twice daily. Brush tongue and rinse mouth afterwards -Continue singulair 10 mg At bedtime -Continue Xyzal 5 mg daily   Continue to use CPAP every night, minimum of 6 hours a night.  Change equipment every 30 days or as directed by DME. Wash your tubing with warm soap and water daily, hang to dry. Wash humidifier portion weekly. Use bottled, distilled water and change daily  Be aware of reduced alertness and do not drive or operate heavy machinery if experiencing this or drowsiness.  Notify if persistent daytime sleepiness occurs even with consistent use of CPAP.   Follow up in one year with Dr. Craige Cotta. If symptoms do not improve or worsen, please contact office for sooner follow up or seek emergency care.

## 2023-02-20 NOTE — Patient Instructions (Signed)
Continue Albuterol inhaler 2 puffs every 6 hours as needed for shortness of breath or wheezing. Notify if symptoms persist despite rescue inhaler/neb use. Continue Wixela 1 puff Twice daily. Brush tongue and rinse mouth afterwards -Continue singulair 10 mg At bedtime -Continue Xyzal 5 mg daily   Continue to use CPAP every night, minimum of 6 hours a night.  Change equipment every 30 days or as directed by DME. Wash your tubing with warm soap and water daily, hang to dry. Wash humidifier portion weekly. Use bottled, distilled water and change daily  Be aware of reduced alertness and do not drive or operate heavy machinery if experiencing this or drowsiness.  Notify if persistent daytime sleepiness occurs even with consistent use of CPAP.   Follow up in one year with Dr. Craige Cotta. If symptoms do not improve or worsen, please contact office for sooner follow up or seek emergency care.

## 2023-02-22 NOTE — Progress Notes (Signed)
Reviewed and agree with assessment/plan.   Miasia Crabtree, MD [facility] 02/22/2023, 7:16 AM Pager:  [phone]  

## 2023-02-25 ENCOUNTER — Other Ambulatory Visit: Payer: Self-pay

## 2023-02-25 MED ORDER — CYCLOBENZAPRINE HCL 10 MG PO TABS: 10.0000 mg | ORAL_TABLET | Freq: Three times a day (TID) | ORAL | 2 refills | Status: DC | PRN

## 2023-02-25 MED ORDER — IPRATROPIUM BROMIDE 0.02 % IN SOLN: 3.0000 mL | Freq: Four times a day (QID) | RESPIRATORY_TRACT | 3 refills | Status: DC | PRN

## 2023-02-25 MED ORDER — ALBUTEROL SULFATE HFA 108 (90 BASE) MCG/ACT IN AERS: 1.0000 | INHALATION_SPRAY | Freq: Four times a day (QID) | RESPIRATORY_TRACT | 2 refills | Status: DC | PRN

## 2023-02-26 ENCOUNTER — Other Ambulatory Visit: Payer: Self-pay

## 2023-02-26 MED ORDER — IPRATROPIUM BROMIDE 0.02 % IN SOLN: 3.0000 mL | Freq: Four times a day (QID) | RESPIRATORY_TRACT | 0 refills | Status: DC | PRN

## 2023-02-27 ENCOUNTER — Other Ambulatory Visit: Payer: Self-pay | Admitting: Family Medicine

## 2023-02-28 NOTE — Telephone Encounter (Signed)
I do not know what her insurance covers.

## 2023-03-01 ENCOUNTER — Other Ambulatory Visit: Payer: Self-pay | Admitting: Family Medicine

## 2023-03-01 MED ORDER — ATROVENT HFA 17 MCG/ACT IN AERS: 2.0000 | INHALATION_SPRAY | Freq: Four times a day (QID) | RESPIRATORY_TRACT | 3 refills | Status: DC | PRN

## 2023-04-01 ENCOUNTER — Other Ambulatory Visit: Payer: Self-pay

## 2023-04-01 ENCOUNTER — Telehealth: Payer: Self-pay

## 2023-04-01 DIAGNOSIS — I10 Essential (primary) hypertension: Secondary | ICD-10-CM

## 2023-04-01 MED ORDER — VALSARTAN-HYDROCHLOROTHIAZIDE 160-12.5 MG PO TABS: 1.0000 | ORAL_TABLET | Freq: Every day | ORAL | 1 refills | Status: DC

## 2023-04-01 NOTE — Patient Outreach (Signed)
  Care Coordination   04/01/2023 Name: Rebecca Rojas MRN: [id number] DOB: 25-Sep-1955   Care Coordination Outreach Attempts:  An unsuccessful telephone outreach was attempted today to offer the patient information about available care coordination services.  Follow Up Plan:  Additional outreach attempts will be made to offer the patient care coordination information and services.   Encounter Outcome:  No Answer   Care Coordination Interventions:  No, not indicated    Rowe Pavy, RN, BSN, [facility] [employer] [phone]

## 2023-04-02 ENCOUNTER — Telehealth: Payer: Self-pay

## 2023-04-02 NOTE — Patient Outreach (Signed)
  Care Coordination   Initial Visit Note   04/02/2023 Name: DANEY REVOIR MRN: [id number] DOB: Nov 25, 1955  CAETLYN AY is a 67 y.o. year old female who sees Cox, Fritzi Mandes, MD for primary care. I spoke with  [name] by phone today.  What matters to the patients health and wellness today?  Placed call to patient to review [facility] care coordination program.  Patient has provided verbal consent and appointment scheduled for assessment tomorrow.  Patients biggest concern is her DM. She is not able to tolerate Metformin.     SDOH assessments and interventions completed:  No     Care Coordination Interventions:  Yes, provided   Follow up plan: Follow up call scheduled for 04/03/2023    Encounter Outcome:  Pt. Visit Completed   Rowe Pavy, RN, BSN, CEN [facility] Coordinator [phone]

## 2023-04-03 ENCOUNTER — Ambulatory Visit: Payer: Self-pay

## 2023-04-03 NOTE — Patient Instructions (Signed)
Visit Information  Thank you for taking time to visit with me today. Please don't hesitate to contact me if I can be of assistance to you before our next scheduled telephone appointment.  Following are the goals we discussed today:    Goals       Activity and Exercise Increased      Evidence-based guidance:  Review current exercise levels.  Assess patient perspective on exercise or activity level, barriers to increasing activity, motivation and readiness for change.  Recommend or set healthy exercise goal based on individual tolerance.  Encourage small steps toward making change in amount of exercise or activity.  Urge reduction of sedentary activities or screen time.  Promote group activities within the community or with family or support person.  Consider referral to rehabiliation therapist for assessment and exercise/activity plan.         Care coordination:  Patient will have more understanding of DM. (pt-stated)      Interventions Today    Flowsheet Row Most Recent Value  Chronic Disease   Chronic disease during today's visit Diabetes  General Interventions   General Interventions Discussed/Reviewed General Interventions Discussed, Labs, Annual Foot Exam, Doctor Visits  Labs Hgb A1c every 3 months  Doctor Visits Discussed/Reviewed Doctor Visits Discussed  Exercise Interventions   Exercise Discussed/Reviewed Physical Activity, Weight Managment  Physical Activity Discussed/Reviewed Types of exercise, Home Exercise Program (HEP)  Weight Management Weight loss  Education Interventions   Education Provided Provided Education  [Reviewed DM care and importance of exercise and nutrition]  Provided Verbal Education On Nutrition, Foot Care, Labs, Blood Sugar Monitoring, Exercise, Medication, When to see the doctor  Nutrition Interventions   Nutrition Discussed/Reviewed Nutrition Discussed, Carbohydrate meal planning, Decreasing sugar intake, Increasing proteins  Pharmacy  Interventions   Pharmacy Dicussed/Reviewed Medications and their functions  Advanced Directive Interventions   Advanced Directives Discussed/Reviewed Advanced Directives Discussed, Provided resource for acquiring and filling out documents      Will mail DM education, Living well with DM. DM diet and in home exercise plan.       Prevent falls      02/18/2023 AWV Goal: Fall Prevention  Over the next year, patient will decrease their risk for falls by: Using assistive devices, such as a cane or walker, as needed Identifying fall risks within their home and correcting them by: Removing throw rugs Adding handrails to stairs or ramps Removing clutter and keeping a clear pathway throughout the home Increasing light, especially at night Adding shower handles/bars Raising toilet seat Identifying potential personal risk factors for falls: Medication side effects Incontinence/urgency Vestibular dysfunction Hearing loss Musculoskeletal disorders Neurological disorders Orthostatic hypotension           Our next appointment is by telephone on 04/24/2023 at 10am  Please call the care guide team at [phone] if you need to cancel or reschedule your appointment.   If you are experiencing a Mental Health or Behavioral Health Crisis or need someone to talk to, please call the Suicide and Crisis Lifeline: 988 call the [place] National Suicide Prevention Lifeline: (912) 666-9625 or TTY: (954)641-9626 TTY 365-208-7169) to talk to a trained counselor call 1-800-273-TALK (toll free, 24 hour hotline) call 911   Patient verbalizes understanding of instructions and care plan provided today and agrees to view in MyChart. Active MyChart status and patient understanding of how to access instructions and care plan via MyChart confirmed with patient.     Rowe Pavy, RN, BSN, CEN [facility] [employer] [phone]

## 2023-04-03 NOTE — Patient Outreach (Signed)
  Care Coordination   Initial Visit Note   04/03/2023 Name: Rebecca Rojas MRN: [id number] DOB: 03-Jun-1956  Rebecca Rojas is a 67 y.o. year old female who sees Cox, Fritzi Mandes, MD for primary care. I spoke with  [name] by phone today.  What matters to the patients health and wellness today?  Patient reports that she is concerned about her DM. Reports recent A1c up to 6.2.  Started on Metformin and reports that she can not tolerate it due to nausea.   Non fasting CBG today 114.  Reports she is doing some yoga on her own at home. Patient is trying to exercise for 30 minutes per day.  Patient reports that she is watching her diet.  Reports she is eating oatmeal , apple cider vinegar, decrease in bread, not frying foods and avoiding sweets.    Goals Addressed               This Visit's Progress     Care coordination:  Patient will have more understanding of DM. (pt-stated)        Interventions Today    Flowsheet Row Most Recent Value  Chronic Disease   Chronic disease during today's visit Diabetes  General Interventions   General Interventions Discussed/Reviewed General Interventions Discussed, Labs, Annual Foot Exam, Doctor Visits  Labs Hgb A1c every 3 months  Doctor Visits Discussed/Reviewed Doctor Visits Discussed  Exercise Interventions   Exercise Discussed/Reviewed Physical Activity, Weight Managment  Physical Activity Discussed/Reviewed Types of exercise, Home Exercise Program (HEP)  Weight Management Weight loss  Education Interventions   Education Provided Provided Education  [Reviewed DM care and importance of exercise and nutrition]  Provided Verbal Education On Nutrition, Foot Care, Labs, Blood Sugar Monitoring, Exercise, Medication, When to see the doctor  Nutrition Interventions   Nutrition Discussed/Reviewed Nutrition Discussed, Carbohydrate meal planning, Decreasing sugar intake, Increasing proteins  Pharmacy Interventions   Pharmacy Dicussed/Reviewed  Medications and their functions  Advanced Directive Interventions   Advanced Directives Discussed/Reviewed Advanced Directives Discussed, Provided resource for acquiring and filling out documents      Will mail DM education, Living well with DM. DM diet and in home exercise plan.         SDOH assessments and interventions completed:  Yes  SDOH Interventions Today    Flowsheet Row Most Recent Value  SDOH Interventions   Food Insecurity Interventions Intervention Not Indicated  Housing Interventions Intervention Not Indicated  Transportation Interventions Intervention Not Indicated  Utilities Interventions Intervention Not Indicated  Alcohol Usage Interventions Intervention Not Indicated (Score <7)        Care Coordination Interventions:  Yes, provided   Follow up plan: Follow up call scheduled for 04/24/2023    Encounter Outcome:  Pt. Visit Completed   Rowe Pavy, RN, BSN, CEN [facility] Coordinator [phone]

## 2023-04-05 NOTE — Patient Instructions (Signed)
Visit Information  Thank you for taking time to visit with me today. Please don't hesitate to contact me if I can be of assistance to you before our next scheduled telephone appointment.  Following are the goals we discussed today:   Goals Addressed               This Visit's Progress     Care coordination:  Patient will have more understanding of DM. (pt-stated)        Interventions Today    Flowsheet Row Most Recent Value  Chronic Disease   Chronic disease during today's visit Diabetes  General Interventions   General Interventions Discussed/Reviewed General Interventions Discussed, Labs, Annual Foot Exam, Doctor Visits  Labs Hgb A1c every 3 months  Doctor Visits Discussed/Reviewed Doctor Visits Discussed  Exercise Interventions   Exercise Discussed/Reviewed Physical Activity, Weight Managment  Physical Activity Discussed/Reviewed Types of exercise, Home Exercise Program (HEP)  Weight Management Weight loss  Education Interventions   Education Provided Provided Education  [Reviewed DM care and importance of exercise and nutrition]  Provided Verbal Education On Nutrition, Foot Care, Labs, Blood Sugar Monitoring, Exercise, Medication, When to see the doctor  Nutrition Interventions   Nutrition Discussed/Reviewed Nutrition Discussed, Carbohydrate meal planning, Decreasing sugar intake, Increasing proteins  Pharmacy Interventions   Pharmacy Dicussed/Reviewed Medications and their functions  Advanced Directive Interventions   Advanced Directives Discussed/Reviewed Advanced Directives Discussed, Provided resource for acquiring and filling out documents      Will mail DM education, Living well with DM. DM diet and in home exercise plan.          Our next appointment is by telephone on 04/24/2023   Please call the care guide team at [phone] if you need to cancel or reschedule your appointment.   If you are experiencing a Mental Health or Behavioral Health Crisis or need  someone to talk to, please call the Suicide and Crisis Lifeline: 988 call the [place] National Suicide Prevention Lifeline: (959)634-7642 or TTY: 787-359-6557 TTY 516 562 8070) to talk to a trained counselor call 1-800-273-TALK (toll free, 24 hour hotline) call 911   Patient verbalizes understanding of instructions and care plan provided today and agrees to view in MyChart. Active MyChart status and patient understanding of how to access instructions and care plan via MyChart confirmed with patient.     Rowe Pavy, RN, BSN, CEN [facility] [phone]

## 2023-04-21 ENCOUNTER — Other Ambulatory Visit: Payer: Self-pay | Admitting: Family Medicine

## 2023-05-01 ENCOUNTER — Ambulatory Visit
Admission: RE | Admit: 2023-05-01 | Discharge: 2023-05-01 | Disposition: A | Discharge status: Home / Self Care | Payer: Medicare Other | Source: Ambulatory Visit | Attending: Family Medicine | Admitting: Family Medicine

## 2023-05-01 DIAGNOSIS — Z1231 Encounter for screening mammogram for malignant neoplasm of breast: Secondary | ICD-10-CM

## 2023-05-02 ENCOUNTER — Ambulatory Visit: Payer: Self-pay

## 2023-05-02 NOTE — Patient Outreach (Signed)
  Care Coordination   Follow Up Visit Note   05/02/2023 Name: AQUANETTA DARCEY MRN: [id number] DOB: [birth year]  NEKESHIA BRAMMER is a 67 y.o. year old female who sees Cox, Fritzi Mandes, MD for primary care. I spoke with  [name] by phone today.  What matters to the patients health and wellness today?  Patient reports that she is doing well.  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  Reports that she feels better. CBG yesterday  non fasting was 90.  Reports that she weighs daily is unchanged.  Reports joint are doing well.  Reports that she continues to follow her DM diet. Patient has an upbeat voice and is proud of her progress.     Goals Addressed               This Visit's Progress     Care coordination:  Patient will have more understanding of DM. (pt-stated)        Interventions Today    Flowsheet Row Most Recent Value  Chronic Disease   Chronic disease during today's visit Diabetes  General Interventions   General Interventions Discussed/Reviewed General Interventions Reviewed  Exercise Interventions   Exercise Discussed/Reviewed Physical Activity, Weight Managment  Physical Activity Discussed/Reviewed Types of exercise, Gym  [encouraged patient to continue to go her exercises daily.]  Weight Management Weight loss  Nutrition Interventions   Nutrition Discussed/Reviewed Nutrition Discussed, Carbohydrate meal planning  Pharmacy Interventions   Pharmacy Dicussed/Reviewed Medications and their functions              SDOH assessments and interventions completed:  No     Care Coordination Interventions:  Yes, provided   Follow up plan: Follow up call scheduled for 04/30/2023   Encouraged patient to call me sooner if needed.   Encounter Outcome:  Patient Visit Completed   Lonia Chimera, RN, BSN, CEN [facility] Coordinator [phone]

## 2023-05-06 ENCOUNTER — Other Ambulatory Visit: Payer: Self-pay | Admitting: Family Medicine

## 2023-05-06 DIAGNOSIS — R928 Other abnormal and inconclusive findings on diagnostic imaging of breast: Secondary | ICD-10-CM

## 2023-05-10 ENCOUNTER — Other Ambulatory Visit: Payer: Self-pay | Admitting: Family Medicine

## 2023-05-10 ENCOUNTER — Ambulatory Visit
Admission: RE | Admit: 2023-05-10 | Discharge: 2023-05-10 | Disposition: A | Discharge status: Home / Self Care | Payer: Medicare Other | Source: Ambulatory Visit | Attending: Family Medicine | Admitting: Family Medicine

## 2023-05-10 DIAGNOSIS — N6320 Unspecified lump in the left breast, unspecified quadrant: Secondary | ICD-10-CM

## 2023-05-10 DIAGNOSIS — R928 Other abnormal and inconclusive findings on diagnostic imaging of breast: Secondary | ICD-10-CM

## 2023-05-12 ENCOUNTER — Other Ambulatory Visit: Payer: Self-pay | Admitting: Family Medicine

## 2023-05-12 DIAGNOSIS — N6323 Unspecified lump in the left breast, lower outer quadrant: Secondary | ICD-10-CM

## 2023-05-14 ENCOUNTER — Ambulatory Visit
Admission: RE | Admit: 2023-05-14 | Discharge: 2023-05-14 | Disposition: A | Discharge status: Home / Self Care | Payer: Medicare Other | Source: Ambulatory Visit | Attending: Family Medicine | Admitting: Family Medicine

## 2023-05-14 DIAGNOSIS — N6320 Unspecified lump in the left breast, unspecified quadrant: Secondary | ICD-10-CM

## 2023-05-14 HISTORY — PX: BREAST BIOPSY: SHX20

## 2023-05-15 LAB — SURGICAL PATHOLOGY

## 2023-05-26 NOTE — Progress Notes (Unsigned)
Subjective:  Patient ID: Rebecca Rojas, female    DOB: Jun 20, 1956  Age: 67 y.o. MRN: [id number]  Chief Complaint  Patient presents with   Medical Management of Chronic Issues    HPI Pt presents for follow-up of HTN, Hyperlipidemia, and asthma.   Hypertension, follow-up: Medication management includes Metoprolol XL 25 mg DAILY, Valsartan/hydrochlorothiazide 160/12.5 mg once daily.. She is following a Regular diet. She is swimming several times a week.  .  Atrial fibrillation: s/p ablation. Metoprolol xl 25 mg daily and baby aspirin 81 mg daily.   Lipid/Cholesterol, Follow-up Medications: Rosuvastatin 10 mg daily. Current diet: well balanced  Vitamin D deficiency: taking vitamin D 50K weekly.   Prediabetes: A1C LAST CHECKED WAS 6.2%. Did not tolerate metformin.  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 Medications: Singulair, Xyzal, Wixela one puff twice daily, Albuterol inhaler. States symptoms are currently well-controlled.   Goes to [employer] and swims/works out for 2+ hours.      05/27/2023    8:14 AM 04/03/2023   10:23 AM 02/18/2023    2:09 PM 01/23/2023    7:55 AM 10/10/2022    9:52 AM  Depression screen PHQ 2/9  Decreased Interest 0 0 0 0 0  Down, Depressed, Hopeless 0 0 0 0 0  PHQ - 2 Score 0 0 0 0 0  Altered sleeping   0 0   Tired, decreased energy   0 0   Change in appetite   0 0   Feeling bad or failure about yourself    0 0   Trouble concentrating   0 0   Moving slowly or fidgety/restless   0 0   Suicidal thoughts   0 0   PHQ-9 Score   0 0   Difficult doing work/chores   Not difficult at all Not difficult at all         05/27/2023    8:14 AM  Fall Risk   Falls in the past year? 0  Number falls in past yr: 0  Injury with Fall? 0  Risk for fall due to : No Fall Risks  Follow up Falls evaluation completed;Falls prevention discussed    Patient Care Team: Rebecca Ohara, MD as PCP - General (Family Medicine) Rebecca Lemming, MD as PCP - Electrophysiology (Cardiology) Rebecca Lemming, MD as PCP - Cardiology (Cardiology) Rebecca Helling, MD (Inactive) as Consulting Physician (Pulmonary Disease) Rebecca Ace, MD as Referring Physician (Allergy) Rebecca Server, RN as [facility] Care Management   Review of Systems  Constitutional:  Negative for chills, fatigue and fever.  HENT:  Negative for congestion, ear pain, rhinorrhea and sore throat.   Respiratory:  Negative for cough and shortness of breath.   Cardiovascular:  Negative for chest pain.  Gastrointestinal:  Negative for abdominal pain, constipation, diarrhea, nausea and vomiting.  Genitourinary:  Negative for dysuria and urgency.  Musculoskeletal:  Positive for back pain. Negative for myalgias.  Neurological:  Negative for dizziness, weakness, light-headedness and headaches.  Psychiatric/Behavioral:  Negative for dysphoric mood. The patient is not nervous/anxious.     Current Outpatient Medications on File Prior to Visit  Medication Sig Dispense Refill   albuterol (VENTOLIN HFA) 108 (90 Base) MCG/ACT inhaler Inhale 1-2 puffs into the lungs every 6 (six) hours as needed for wheezing or shortness of breath. 18 g 2   aspirin EC 81 MG tablet Take 81 mg by mouth daily. Swallow whole.     azelastine (OPTIVAR)  0.05 % ophthalmic solution Place 1 drop into both eyes 2 (two) times daily as needed for allergies (allergies).     cyclobenzaprine (FLEXERIL) 10 MG tablet Take 1 tablet (10 mg total) by mouth 3 (three) times daily as needed. 30 tablet 2   EPINEPHrine 0.3 mg/0.3 mL IJ SOAJ injection Inject 0.3 mg into the muscle as needed for anaphylaxis. (Patient not taking: Reported on 04/03/2023)     ipratropium (ATROVENT HFA) 17 MCG/ACT inhaler Inhale 2 puffs into the lungs every 6 (six) hours as needed for wheezing. 12.9 g 3   levocetirizine (XYZAL) 5 MG tablet Take 5 mg by mouth every evening.     metoprolol succinate (TOPROL-XL) 25 MG 24 hr  tablet Take 1 tablet (25 mg total) by mouth daily. 90 tablet 3   montelukast (SINGULAIR) 10 MG tablet Take 10 mg by mouth at bedtime.     rosuvastatin (CRESTOR) 10 MG tablet TAKE 1 TABLET BY MOUTH EVERY DAY 90 tablet 0   valsartan-hydrochlorothiazide (DIOVAN-HCT) 160-12.5 MG tablet Take 1 tablet by mouth daily. 90 tablet 1   Vitamin D, Ergocalciferol, (DRISDOL) 1.25 MG (50000 UNIT) CAPS capsule Take 1 capsule (50,000 Units total) by mouth every 7 (seven) days. 12 capsule 1   WIXELA INHUB 250-50 MCG/ACT AEPB Inhale 1 puff into the lungs 2 (two) times daily.     No current facility-administered medications on file prior to visit.   Past Medical History:  Diagnosis Date   Allergic rhinitis 02/15/2021   Allergic rhinitis due to animal (cat) (dog) hair and dander 02/15/2021   Allergic rhinitis due to pollen 02/15/2021   Allergy    dust, environmental, feathers, has epipen, prn   Allergy-induced asthma    Anemia    Arthritis    osteoarthritis   Arthritis of right acromioclavicular joint 06/15/2019   Atrial fibrillation (HCC) 05/20/2020   Back pain    Benign essential hypertension 12/28/2015   Bilateral lower extremity edema 08/04/2018   Chronic allergic conjunctivitis 02/15/2021   Glenoid labral tear, right, initial encounter 06/15/2019   Headache    Hordeolum internum of right lower eyelid 02/14/2017   Hypertensive heart disease 06/02/2020   Insulin resistance 10/01/2019   Joint pain    Migraine headache 12/28/2015   Mixed hyperlipidemia 12/28/2015   Morbid obesity (HCC)    Myalgia 12/28/2015   Osteoarthritis    Rotator cuff tear, right 06/15/2019   Skin lesion of foot 08/30/2016   Sleep apnea    uses cpap   Vertigo    Vitamin D deficiency    Past Surgical History:  Procedure Laterality Date   ABDOMINAL HYSTERECTOMY     BREAST BIOPSY Left 05/14/2023   [place] LT BREAST BX W LOC DEV 1ST LESION IMG BX SPEC US GUIDE 05/14/2023 [facility]   COLONOSCOPY  09/2010   normal    FRACTURE SURGERY     left 5th finger fx with bone graft repair   HEEL SPUR EXCISION     JOINT REPLACEMENT     KNEE ARTHROSCOPY Left    Left Knee lateral release     LIPOMA EXCISION     SHOULDER ARTHROSCOPY WITH ROTATOR CUFF REPAIR AND SUBACROMIAL DECOMPRESSION Right 06/15/2019   Procedure: SHOULDER ARTHROSCOPY WITH ROTATOR CUFF REPAIR AND SUBACROMIAL DECOMPRESSION;  Surgeon: Jodi Geralds, MD;  Location: [facility];  Service: Orthopedics;  Laterality: Right;   SVT ABLATION N/A 12/06/2020   Procedure: SVT ABLATION;  Surgeon: Rebecca Lemming, MD;  Location: [facility] INVASIVE CV LAB;  Service:  Cardiovascular;  Laterality: N/A;   TONSILLECTOMY     TOTAL KNEE ARTHROPLASTY  08/03/2011   Procedure: TOTAL KNEE ARTHROPLASTY;  Surgeon: Harvie Junior;  Location: [facility] OR;  Service: Orthopedics;  Laterality: Right;  COMPUTER ASSISTED TOTAL KNEE REPLACEMENTwith revision tibial component   TUBAL LIGATION      Family History  Problem Relation Age of Onset   Hypertension Mother    Heart disease Mother    Heart attack Mother    Hyperlipidemia Mother    Stroke Mother    Obesity Mother    Cancer Father    Liver disease Father    Obesity Father    Anesthesia problems Neg Hx    Hypotension Neg Hx    Malignant hyperthermia Neg Hx    Pseudochol deficiency Neg Hx    Colon cancer Neg Hx    Colon polyps Neg Hx    Esophageal cancer Neg Hx    Rectal cancer Neg Hx    Stomach cancer Neg Hx    Breast cancer Neg Hx    Social History   Socioeconomic History   Marital status: Married    Spouse name: Jelitza Manninen   Number of children: 2   Years of education: Not on file   Highest education level: Some college, no degree  Occupational History   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 kits  Tobacco Use   Smoking status: Never    Passive exposure: Never   Smokeless tobacco: Never  Vaping Use   Vaping status: Never Used  Substance and Sexual Activity   Alcohol use: Never   Drug use: Never   Sexual activity: Yes   Other Topics Concern   Not on file  Social History Narrative   Not on file   Social Determinants of Health   Financial Resource Strain: Low Risk  (01/22/2023)   Overall Financial Resource Strain (CARDIA)    Difficulty of Paying Living Expenses: Not hard at all  Food Insecurity: No Food Insecurity (04/03/2023)   Hunger Vital Sign    Worried About Running Out of Food in the Last Year: Never true    Ran Out of Food in the Last Year: Never true  Transportation Needs: No Transportation Needs (04/03/2023)   PRAPARE - Administrator, Civil Service (Medical): No    Lack of Transportation (Non-Medical): No  Physical Activity: Insufficiently Active (02/18/2023)   Exercise Vital Sign    Days of Exercise per Week: 2 days    Minutes of Exercise per Session: 40 min  Stress: No Stress Concern Present (01/22/2023)   [employer] of Occupational Health - Occupational Stress Questionnaire    Feeling of Stress : Not at all  Social Connections: Moderately Integrated (01/22/2023)   Social Connection and Isolation Panel [NHANES]    Frequency of Communication with Friends and Family: Three times a week    Frequency of Social Gatherings with Friends and Family: Once a week    Attends Religious Services: More than 4 times per year    Active Member of [employer] or Organizations: No    Attends Engineer, structural: Never    Marital Status: Married    Objective:  BP 138/84   Pulse 88   Temp (!) 96.7 F (35.9 C)   Resp 18   Ht 5\' 2"  (1.575 m)   Wt 272 lb (123.4 kg)   BMI 49.75 kg/m      05/27/2023    8:09 AM 04/03/2023   10:16 AM 02/20/2023  2:53 PM  BP/Weight  Systolic BP 138  161  Diastolic BP 84  70  Wt. (Lbs) 272 270 271.6  BMI 49.75 kg/m2 49.38 kg/m2 49.68 kg/m2    Physical Exam Vitals reviewed.  Constitutional:      Appearance: Normal appearance. She is obese.  Neck:     Vascular: No carotid bruit.  Cardiovascular:     Rate and Rhythm: Normal rate and regular  rhythm.     Heart sounds: Normal heart sounds.  Pulmonary:     Effort: Pulmonary effort is normal. No respiratory distress.     Breath sounds: Normal breath sounds.  Abdominal:     General: Abdomen is flat. Bowel sounds are normal.     Palpations: Abdomen is soft.     Tenderness: There is no abdominal tenderness.  Neurological:     Mental Status: She is alert and oriented to person, place, and time.  Psychiatric:        Mood and Affect: Mood normal.        Behavior: Behavior normal.     Diabetic Foot Exam - Simple   No data filed      Lab Results  Component Value Date   WBC 11.0 (H) 01/23/2023   HGB 14.0 01/23/2023   HCT 42.6 01/23/2023   PLT 324 01/23/2023   GLUCOSE 108 (H) 01/23/2023   CHOL 184 01/23/2023   TRIG 205 (H) 01/23/2023   HDL 43 01/23/2023   LDLCALC 106 (H) 01/23/2023   ALT 27 01/23/2023   AST 31 01/23/2023   NA 141 01/23/2023   K 4.8 01/23/2023   CL 101 01/23/2023   CREATININE 0.84 01/23/2023   BUN 16 01/23/2023   CO2 25 01/23/2023   TSH 4.030 10/10/2022   INR 1.53 (H) 08/06/2011   HGBA1C 6.2 (H) 01/23/2023      Assessment & Plan:    Hypertensive heart disease without heart failure Assessment & Plan: Well controlled.  Continue current medication. Valsartan-HYDROCHLOROTHIAZIDE 160/12.5 mg daily. Metoprolol succinate XL 25 mg daily.    Orders: -     CBC with Differential/Platelet -     Comprehensive metabolic panel  Mixed hyperlipidemia Assessment & Plan: Await labs/testing for assessment and recommendations. Continue with rosuvastatin 10 mg daily.  Continue to work on eating a healthy diet and exercise.  Labs drawn today.    Orders: -     Lipid panel  Impaired fasting glucose Assessment & Plan: Hemoglobin A1c 6.2, 3 month avg of blood sugars, is in prediabetic range.  In order to prevent progression to diabetes, recommend low carb diet and regular exercise   Orders: -     Hemoglobin A1c  Encounter for immunization -     Flu  Vaccine Trivalent High Dose (Fluad)     No orders of the defined types were placed in this encounter.   Orders Placed This Encounter  Procedures   Flu Vaccine Trivalent High Dose (Fluad)   CBC with Differential/Platelet   Comprehensive metabolic panel   Hemoglobin A1c   Lipid panel     Follow-up: Return in about 4 months (around 09/27/2023) for chronic fasting.   I,Marla I Leal-Borjas,acting as a scribe for Rebecca Ohara, MD.,have documented all relevant documentation on the behalf of Rebecca Ohara, MD,as directed by  Rebecca Ohara, MD while in the presence of Rebecca Ohara, MD.   An After Visit Summary was printed and given to the patient.  I attest that I have reviewed this visit and agree with the  plan scribed by my staff.   Rebecca Ohara, MD [facility] [phone]

## 2023-05-26 NOTE — Assessment & Plan Note (Signed)
Well controlled.  Continue current medication. Valsartan-HYDROCHLOROTHIAZIDE 160/12.5 mg daily. Metoprolol succinate XL 25 mg daily.

## 2023-05-26 NOTE — Assessment & Plan Note (Signed)
Not at goal.  Continue with rosuvastatin 10 mg daily.  Continue to work on eating a healthy diet and exercise.  Labs drawn today.

## 2023-05-26 NOTE — Assessment & Plan Note (Signed)
Hemoglobin A1c 6.2%, 3 month avg of blood sugars, is in prediabetic range.  In order to prevent progression to diabetes, recommend low carb diet and regular exercise  

## 2023-05-27 ENCOUNTER — Ambulatory Visit (INDEPENDENT_AMBULATORY_CARE_PROVIDER_SITE_OTHER): Payer: Medicare Other | Admitting: Family Medicine

## 2023-05-27 ENCOUNTER — Encounter: Payer: Self-pay | Admitting: Family Medicine

## 2023-05-27 VITALS — BP 138/84 | HR 88 | Temp 96.7°F | Resp 18 | Ht 62.0 in | Wt 272.0 lb

## 2023-05-27 DIAGNOSIS — R7301 Impaired fasting glucose: Secondary | ICD-10-CM

## 2023-05-27 DIAGNOSIS — E782 Mixed hyperlipidemia: Secondary | ICD-10-CM | POA: Diagnosis not present

## 2023-05-27 DIAGNOSIS — I119 Hypertensive heart disease without heart failure: Secondary | ICD-10-CM

## 2023-05-27 DIAGNOSIS — Z23 Encounter for immunization: Secondary | ICD-10-CM | POA: Insufficient documentation

## 2023-05-27 DIAGNOSIS — E559 Vitamin D deficiency, unspecified: Secondary | ICD-10-CM

## 2023-05-27 LAB — COMPREHENSIVE METABOLIC PANEL
ALT: 19 [IU]/L (ref 0–32)
AST: 21 [IU]/L (ref 0–40)
Albumin: 4 g/dL (ref 3.9–4.9)
Alkaline Phosphatase: 93 [IU]/L (ref 44–121)
BUN/Creatinine Ratio: 15 (ref 12–28)
BUN: 12 mg/dL (ref 8–27)
Bilirubin Total: 0.7 mg/dL (ref 0.0–1.2)
CO2: 24 mmol/L (ref 20–29)
Calcium: 9.1 mg/dL (ref 8.7–10.3)
Chloride: 104 mmol/L (ref 96–106)
Creatinine, Ser: 0.8 mg/dL (ref 0.57–1.00)
Globulin, Total: 2.9 g/dL (ref 1.5–4.5)
Glucose: 109 mg/dL — ABNORMAL HIGH (ref 70–99)
Potassium: 4.8 mmol/L (ref 3.5–5.2)
Sodium: 144 mmol/L (ref 134–144)
Total Protein: 6.9 g/dL (ref 6.0–8.5)
eGFR: 81 mL/min/{1.73_m2} (ref >59)

## 2023-05-27 LAB — HEMOGLOBIN A1C
Est. average glucose Bld gHb Est-mCnc: 128 mg/dL
Hgb A1c MFr Bld: 6.1 % — ABNORMAL HIGH (ref 4.8–5.6)

## 2023-05-27 LAB — CBC WITH DIFFERENTIAL/PLATELET
Basophils Absolute: 0.1 10*3/uL (ref 0.0–0.2)
Basos: 1 %
EOS (ABSOLUTE): 0.3 10*3/uL (ref 0.0–0.4)
Eos: 3 %
Hematocrit: 43 % (ref 34.0–46.6)
Hemoglobin: 13.8 g/dL (ref 11.1–15.9)
Immature Grans (Abs): 0 10*3/uL (ref 0.0–0.1)
Immature Granulocytes: 0 %
Lymphocytes Absolute: 3.5 10*3/uL — ABNORMAL HIGH (ref 0.7–3.1)
Lymphs: 38 %
MCH: 30.1 pg (ref 26.6–33.0)
MCHC: 32.1 g/dL (ref 31.5–35.7)
MCV: 94 fL (ref 79–97)
Monocytes Absolute: 0.6 10*3/uL (ref 0.1–0.9)
Monocytes: 6 %
Neutrophils Absolute: 4.8 10*3/uL (ref 1.4–7.0)
Neutrophils: 52 %
Platelets: 380 10*3/uL (ref 150–450)
RBC: 4.58 x10E6/uL (ref 3.77–5.28)
RDW: 12.7 % (ref 11.7–15.4)
WBC: 9.3 10*3/uL (ref 3.4–10.8)

## 2023-05-27 LAB — LIPID PANEL
Chol/HDL Ratio: 3.1 {ratio} (ref 0.0–4.4)
Cholesterol, Total: 138 mg/dL (ref 100–199)
HDL: 45 mg/dL (ref >39)
LDL Chol Calc (NIH): 67 mg/dL (ref 0–99)
Triglycerides: 153 mg/dL — ABNORMAL HIGH (ref 0–149)
VLDL Cholesterol Cal: 26 mg/dL (ref 5–40)

## 2023-05-30 ENCOUNTER — Other Ambulatory Visit: Payer: Self-pay | Admitting: General Surgery

## 2023-05-30 DIAGNOSIS — N6489 Other specified disorders of breast: Secondary | ICD-10-CM

## 2023-05-31 ENCOUNTER — Telehealth: Payer: Self-pay | Admitting: *Deleted

## 2023-05-31 ENCOUNTER — Other Ambulatory Visit: Payer: Self-pay | Admitting: General Surgery

## 2023-05-31 NOTE — Telephone Encounter (Signed)
   Pre-operative Risk Assessment    Patient Name: Rebecca Rojas  DOB: [birth year] MRN: [id number]      Request for Surgical Clearance    Procedure:   left Breast Radioactive Seed Guided Excisional Biopsy  Date of Surgery:  Clearance TBD                                 Surgeon:  Dr. [name] Surgeon's Group or Practice Name:  [facility] Phone number:  [phone] Fax number:  [phone]   Type of Clearance Requested:   - Medical  - Pharmacy:  Hold Aspirin Not Indicated.   Type of Anesthesia:  General    Additional requests/questions:    Signed, Emmit Pomfret   05/31/2023, 9:59 AM

## 2023-05-31 NOTE — Telephone Encounter (Signed)
   Name: Rebecca Rojas  DOB: 01-27-1956  MRN: [id number]  Primary Cardiologist: Will Jorja Loa, MD   Preoperative team, please contact this patient and set up a phone call appointment for further preoperative risk assessment. Please obtain consent and complete medication review. Thank you for your help.  I confirm that guidance regarding antiplatelet and oral anticoagulation therapy has been completed and, if necessary, noted below.  Per office protocol, if patient is without any new symptoms or concerns at the time of their virtual visit, she may hold Aspirin for 5-7 days prior to procedure. Please resume Aspirin as soon as possible postprocedure, at the discretion of the surgeon.   I also confirmed the patient resides in the state of [place]. As per [facility] Medical Board telemedicine laws, the patient must reside in the state in which the provider is licensed.   Joylene Grapes, NP 05/31/2023, 4:02 PM [facility]

## 2023-06-03 ENCOUNTER — Telehealth: Payer: Self-pay | Admitting: *Deleted

## 2023-06-03 ENCOUNTER — Ambulatory Visit: Payer: Self-pay

## 2023-06-03 NOTE — Patient Outreach (Signed)
  Care Coordination   Follow Up Visit Note   06/03/2023 Name: Rebecca Rojas MRN: [id number] DOB: [birth year]  Rebecca Rojas is a 67 y.o. year old female who sees [facility], Fritzi Mandes, MD for primary care. I spoke with  [name] by phone today.  What matters to the patients health and wellness today?  Patient states that she is waiting for clearance for breast surgery.  Reports that she continues swimming and staying active. A1c down. Reports she is in good spirits.  Stop gabapentin and flexeril since she has been exercising.  Patient reports that she is self managing well and denies any additional needs.    Goals Addressed               This Visit's Progress     COMPLETED: Care coordination:  Patient will have more understanding of DM. (pt-stated)        Interventions Today    Flowsheet Row Most Recent Value  Chronic Disease   Chronic disease during today's visit Diabetes  General Interventions   General Interventions Discussed/Reviewed General Interventions Discussed, General Interventions Reviewed, Lipid Profile, Doctor Visits  Doctor Visits Discussed/Reviewed Doctor Visits Reviewed, PCP, Specialist  PCP/Specialist Visits Compliance with follow-up visit  Exercise Interventions   Exercise Discussed/Reviewed Physical Activity, Exercise Reviewed  [Reviewed in great detail the importance of continuing to exercise daily.  Reviewed other option vs swimming after her upcoming breast surgery.  Patient is very motivated toward improving her health.]  Physical Activity Discussed/Reviewed Physical Activity Reviewed, Types of exercise  Education Interventions   Education Provided Provided Education  Provided Verbal Education On Nutrition  [Reviewed DM diet and counting carbs.  Reviewed importance of limiting breads, pasta and potatoes.  Patient has a goal to get A1c back to 5.5]  Mental Health Interventions   Mental Health Discussed/Reviewed Other  [offered a listening ear and feedback on  how well she is doing according to her most recent labs.]  Nutrition Interventions   Nutrition Discussed/Reviewed Nutrition Discussed, Nutrition Reviewed, Carbohydrate meal planning, Fluid intake  Pharmacy Interventions   Pharmacy Dicussed/Reviewed Medications and their functions              SDOH assessments and interventions completed:  No     Care Coordination Interventions:  Yes, provided   Follow up plan: No further intervention required.   Encounter Outcome:  Patient Visit Completed   Lonia Chimera, RN, BSN, CEN [facility] Coordinator [phone]

## 2023-06-03 NOTE — Telephone Encounter (Signed)
CORRECT FAX# IS [phone],

## 2023-06-03 NOTE — Telephone Encounter (Signed)
Pt has been scheduled tele pre op appt 06/13/23. Med rec and consent are done.     Patient Consent for Virtual Visit        Rebecca Rojas has provided verbal consent on 06/03/2023 for a virtual visit (video or telephone).   CONSENT FOR VIRTUAL VISIT FOR:  Rebecca Rojas  By participating in this virtual visit I agree to the following:  I hereby voluntarily request, consent and authorize [facility] and its employed or contracted physicians, physician assistants, nurse practitioners or other licensed health care professionals (the Practitioner), to provide me with telemedicine health care services (the "Services") as deemed necessary by the treating Practitioner. I acknowledge and consent to receive the Services by the Practitioner via telemedicine. I understand that the telemedicine visit will involve communicating with the Practitioner through live audiovisual communication technology and the disclosure of certain medical information by electronic transmission. I acknowledge that I have been given the opportunity to request an in-person assessment or other available alternative prior to the telemedicine visit and am voluntarily participating in the telemedicine visit.  I understand that I have the right to withhold or withdraw my consent to the use of telemedicine in the course of my care at any time, without affecting my right to future care or treatment, and that the Practitioner or I may terminate the telemedicine visit at any time. I understand that I have the right to inspect all information obtained and/or recorded in the course of the telemedicine visit and may receive copies of available information for a reasonable fee.  I understand that some of the potential risks of receiving the Services via telemedicine include:  Delay or interruption in medical evaluation due to technological equipment failure or disruption; Information transmitted may not be sufficient (e.g. poor  resolution of images) to allow for appropriate medical decision making by the Practitioner; and/or  In rare instances, security protocols could fail, causing a breach of personal health information.  Furthermore, I acknowledge that it is my responsibility to provide information about my medical history, conditions and care that is complete and accurate to the best of my ability. I acknowledge that Practitioner's advice, recommendations, and/or decision may be based on factors not within their control, such as incomplete or inaccurate data provided by me or distortions of diagnostic images or specimens that may result from electronic transmissions. I understand that the practice of medicine is not an exact science and that Practitioner makes no warranties or guarantees regarding treatment outcomes. I acknowledge that a copy of this consent can be made available to me via my patient portal [facility] MyChart), or I can request a printed copy by calling the office of [facility].    I understand that my insurance will be billed for this visit.   I have read or had this consent read to me. I understand the contents of this consent, which adequately explains the benefits and risks of the Services being provided via telemedicine.  I have been provided ample opportunity to ask questions regarding this consent and the Services and have had my questions answered to my satisfaction. I give my informed consent for the services to be provided through the use of telemedicine in my medical care

## 2023-06-03 NOTE — Telephone Encounter (Signed)
Pt has been scheduled tele pre op appt 06/13/23. Med rec and consent are done.

## 2023-06-13 ENCOUNTER — Encounter: Payer: Self-pay | Admitting: Nurse Practitioner

## 2023-06-13 ENCOUNTER — Ambulatory Visit: Payer: Medicare Other | Attending: Nurse Practitioner | Admitting: Nurse Practitioner

## 2023-06-13 DIAGNOSIS — Z01810 Encounter for preprocedural cardiovascular examination: Secondary | ICD-10-CM

## 2023-06-13 NOTE — Progress Notes (Signed)
Virtual Visit via Telephone Note   Because of Rebecca Rojas's co-morbid illnesses, she is at least at moderate risk for complications without adequate follow up.  This format is felt to be most appropriate for this patient at this time.  The patient did not have access to video technology/had technical difficulties with video requiring transitioning to audio format only (telephone).  All issues noted in this document were discussed and addressed.  No physical exam could be performed with this format.  Please refer to the patient's chart for her consent to telehealth for [facility].  Evaluation Performed:  Preoperative cardiovascular risk assessment _____________   Date:  06/13/2023   Patient ID:  Rebecca Rojas, DOB 1956/08/18, MRN [id number] Patient Location:  Home Provider location:   Office  Primary Care Provider:  Blane Ohara, MD Primary Cardiologist:  Will Jorja Loa, MD  Chief Complaint / Patient Profile   67 y.o. 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 Dwain Sarna on date TBD and presents today for telephonic preoperative cardiovascular risk assessment.  History of Present Illness    Rebecca Rojas is a 67 y.o. female who presents via audio/video conferencing for a telehealth visit today.  Pt was last seen in cardiology clinic on 11/05/22 by Dr.Camnitz.  At that time Rebecca Rojas was doing well.  The patient is now pending procedure as outlined above. Since her last visit, she denies chest pain, shortness of breath, lower extremity edema, fatigue, palpitations, melena, hematuria, hemoptysis, diaphoresis, weakness, presyncope, syncope, orthopnea, and PND. She is active with walking and doing water aerobics for exercise and has no concerning cardiac symptoms.   Past Medical History    Past Medical History:   Diagnosis Date   Allergic rhinitis 02/15/2021   Allergic rhinitis due to animal (cat) (dog) hair and dander 02/15/2021   Allergic rhinitis due to pollen 02/15/2021   Allergy    dust, environmental, feathers, has epipen, prn   Allergy-induced asthma    Anemia    Arthritis    osteoarthritis   Arthritis of right acromioclavicular joint 06/15/2019   Atrial fibrillation (HCC) 05/20/2020   Back pain    Benign essential hypertension 12/28/2015   Bilateral lower extremity edema 08/04/2018   Chronic allergic conjunctivitis 02/15/2021   Glenoid labral tear, right, initial encounter 06/15/2019   Headache    Hordeolum internum of right lower eyelid 02/14/2017   Hypertensive heart disease 06/02/2020   Insulin resistance 10/01/2019   Joint pain    Migraine headache 12/28/2015   Mixed hyperlipidemia 12/28/2015   Morbid obesity (HCC)    Myalgia 12/28/2015   Osteoarthritis    Rotator cuff tear, right 06/15/2019   Skin lesion of foot 08/30/2016   Sleep apnea    uses cpap   Vertigo    Vitamin D deficiency    Past Surgical History:  Procedure Laterality Date   ABDOMINAL HYSTERECTOMY     BREAST BIOPSY Left 05/14/2023   [place] LT BREAST BX W LOC DEV 1ST LESION IMG BX SPEC US GUIDE 05/14/2023 [facility]   COLONOSCOPY  09/2010   normal   FRACTURE SURGERY     left 5th finger fx with bone graft repair   HEEL SPUR EXCISION     JOINT REPLACEMENT     KNEE ARTHROSCOPY Left    Left Knee lateral release     LIPOMA  EXCISION     SHOULDER ARTHROSCOPY WITH ROTATOR CUFF REPAIR AND SUBACROMIAL DECOMPRESSION Right 06/15/2019   Procedure: SHOULDER ARTHROSCOPY WITH ROTATOR CUFF REPAIR AND SUBACROMIAL DECOMPRESSION;  Surgeon: Jodi Geralds, MD;  Location: [facility] OR[facility];  Service: Orthopedics;  Laterality: Right;   SVT ABLATION N/A 12/06/2020   Procedure: SVT ABLATION;  Surgeon: Regan Lemming, MD;  Location: [facility] INVASIVE CV LAB;  Service: Cardiovascular;  Laterality: N/A;   TONSILLECTOMY     TOTAL KNEE  ARTHROPLASTY  08/03/2011   Procedure: TOTAL KNEE ARTHROPLASTY;  Surgeon: Harvie Junior;  Location: [facility] OR;  Service: Orthopedics;  Laterality: Right;  COMPUTER ASSISTED TOTAL KNEE REPLACEMENTwith revision tibial component   TUBAL LIGATION      Allergies  Allergies  Allergen Reactions   Sulfa Antibiotics Itching   Metformin And Related Other (See Comments)    Caused brain fog   Erythromycin    Hydrocodone Bit-Homatrop Mbr Rash    Home Medications    Prior to Admission medications   Medication Sig Start Date End Date Taking? Authorizing Provider  albuterol (VENTOLIN HFA) 108 (90 Base) MCG/ACT inhaler Inhale 1-2 puffs into the lungs every 6 (six) hours as needed for wheezing or shortness of breath. 02/25/23   Blane Ohara, MD  aspirin EC 81 MG tablet Take 81 mg by mouth daily. Swallow whole.    [provider]  azelastine (OPTIVAR) 0.05 % ophthalmic solution Place 1 drop into both eyes 2 (two) times daily as needed for allergies (allergies). Patient not taking: Reported on 06/03/2023 05/16/20   [provider]  cyclobenzaprine (FLEXERIL) 10 MG tablet Take 1 tablet (10 mg total) by mouth 3 (three) times daily as needed. Patient not taking: Reported on 06/03/2023 02/25/23   CoxFritzi Mandes, MD  EPINEPHrine 0.3 mg/0.3 mL IJ SOAJ injection Inject 0.3 mg into the muscle as needed for anaphylaxis. Patient not taking: Reported on 04/03/2023    [provider]  ipratropium (ATROVENT HFA) 17 MCG/ACT inhaler Inhale 2 puffs into the lungs every 6 (six) hours as needed for wheezing. 03/01/23   Cox, Fritzi Mandes, MD  levocetirizine (XYZAL) 5 MG tablet Take 5 mg by mouth every evening.    [provider]  metoprolol succinate (TOPROL-XL) 25 MG 24 hr tablet Take 1 tablet (25 mg total) by mouth daily. 11/26/22   Cox, Fritzi Mandes, MD  montelukast (SINGULAIR) 10 MG tablet Take 10 mg by mouth at bedtime.    [provider]  rosuvastatin (CRESTOR) 10 MG tablet TAKE 1 TABLET BY MOUTH  EVERY DAY 04/22/23   Cox, Kirsten, MD  valsartan-hydrochlorothiazide (DIOVAN-HCT) 160-12.5 MG tablet Take 1 tablet by mouth daily. 04/01/23   Cox, Fritzi Mandes, MD  Vitamin D, Ergocalciferol, (DRISDOL) 1.25 MG (50000 UNIT) CAPS capsule Take 1 capsule (50,000 Units total) by mouth every 7 (seven) days. 07/02/22   Janie Morning, NP  WIXELA INHUB 250-50 MCG/ACT AEPB Inhale 1 puff into the lungs 2 (two) times daily. 10/08/22   [provider]    Physical Exam    Vital Signs:  Rebecca Rojas does not have vital signs available for review today.  Given telephonic nature of communication, physical exam is limited. AAOx3. NAD. Normal affect.  Speech and respirations are unlabored.  Accessory Clinical Findings    None  Assessment & Plan    1.  Preoperative Cardiovascular Risk Assessment: According to the Revised Cardiac Risk Index (RCRI), her Perioperative Risk of Major Cardiac Event is (%): 0.4. Her Functional Capacity in METs is: 7.68 according  to the Duke Activity Status Index (DASI). The patient is doing well from a cardiac perspective. Therefore, based on ACC/AHA guidelines, the patient would be at acceptable risk for the planned procedure without further cardiovascular testing.  I will forward clearance to requesting provdier.   The patient was advised that if she develops new symptoms prior to surgery to contact our office to arrange for a follow-up visit, and she verbalized understanding.  Per office protocol, he may hold aspirin for 5-7 days prior to procedure and should resume as soon as hemodynamically stable postoperatively.   A copy of this note will be routed to requesting surgeon.  Time:   Today, I have spent 10 minutes with the patient with telehealth technology discussing medical history, symptoms, and management plan.     Levi Aland, NP-C  06/13/2023, 2:03 PM [address], [address] Office [phone] Fax [phone]

## 2023-06-16 IMAGING — DX DG CHEST 2V
2 series · 2 of 2 positions shown · non-contrast
Comparison: None.

CLINICAL DATA: Preop clearance.

EXAM:
CHEST - 2 VIEW

[chest pa]
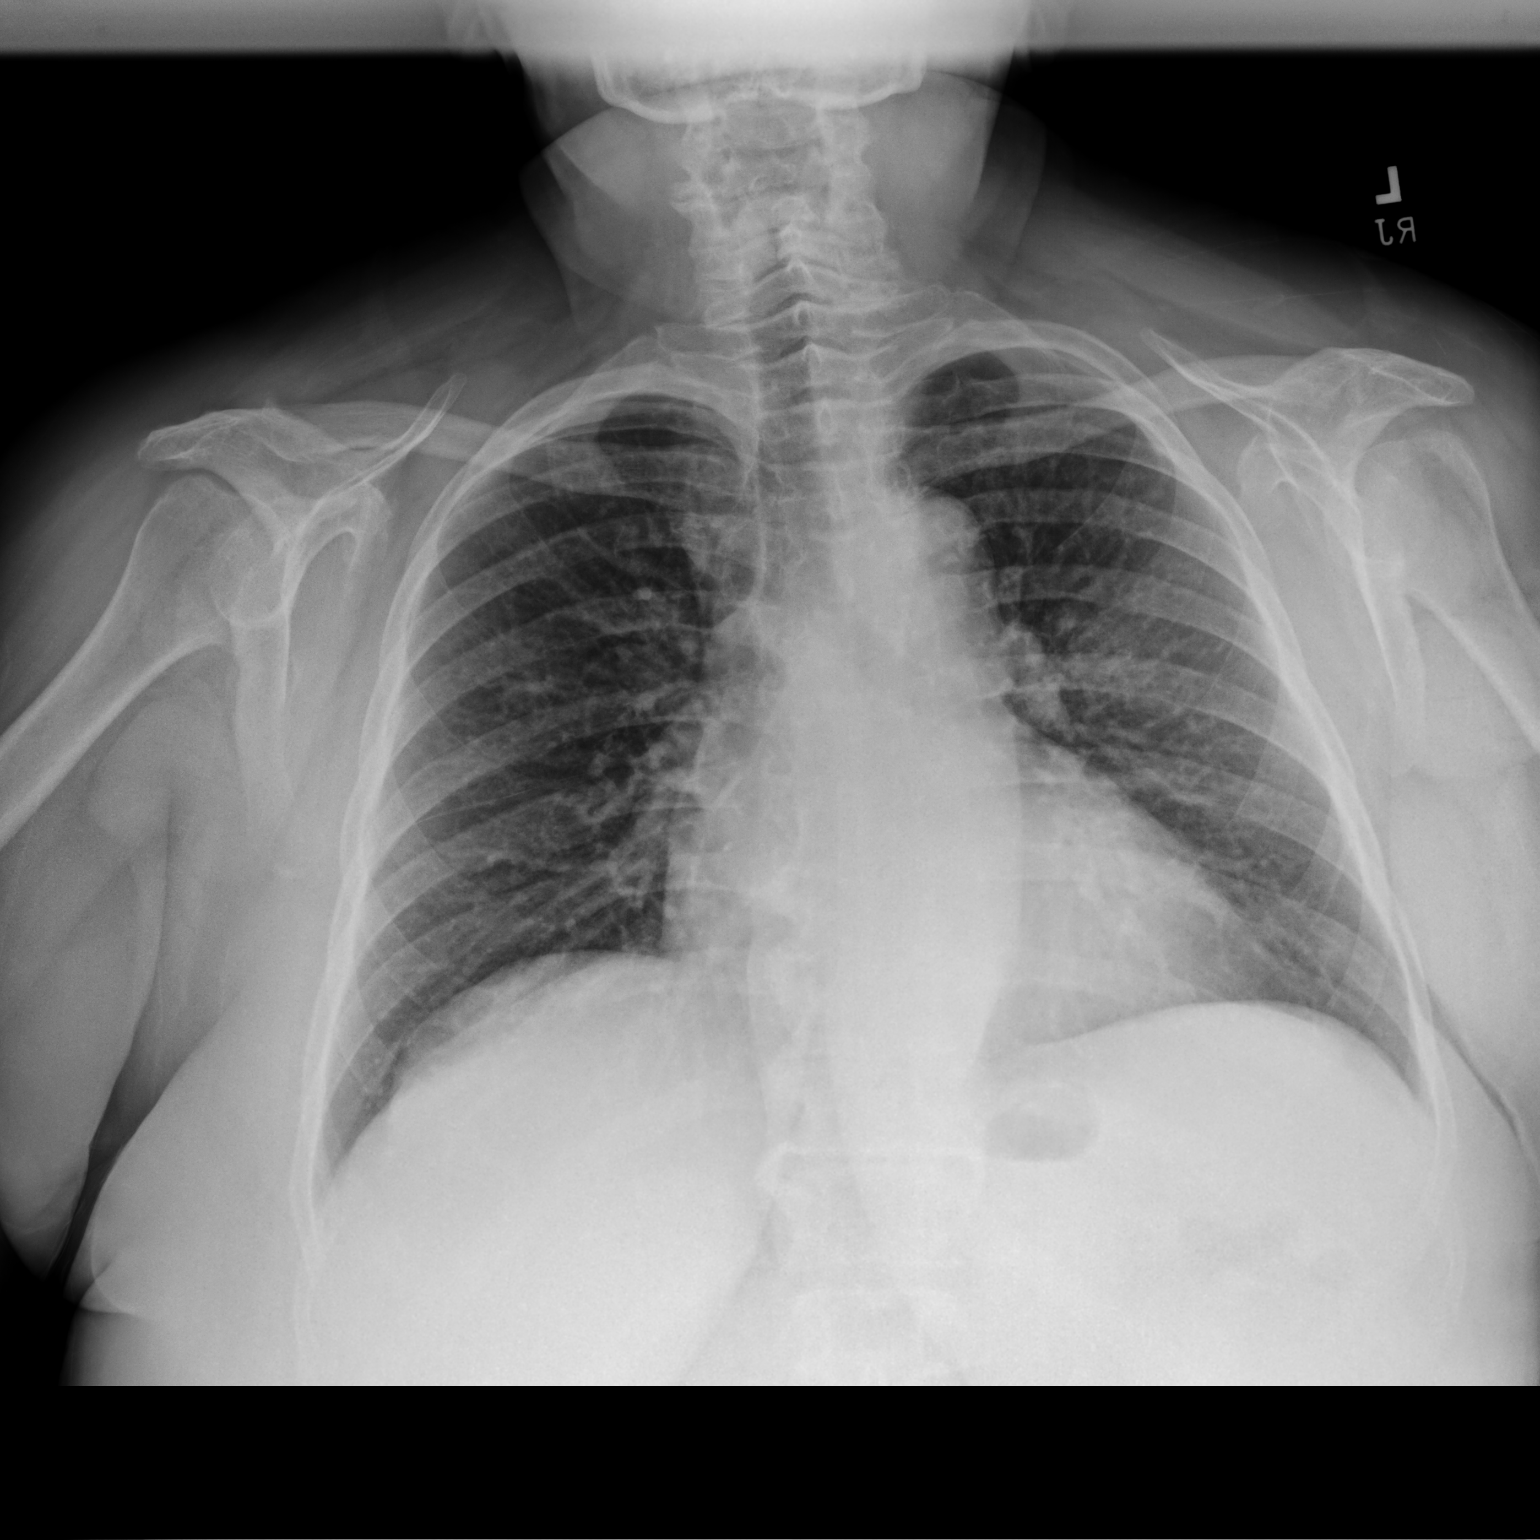

[chest lat]
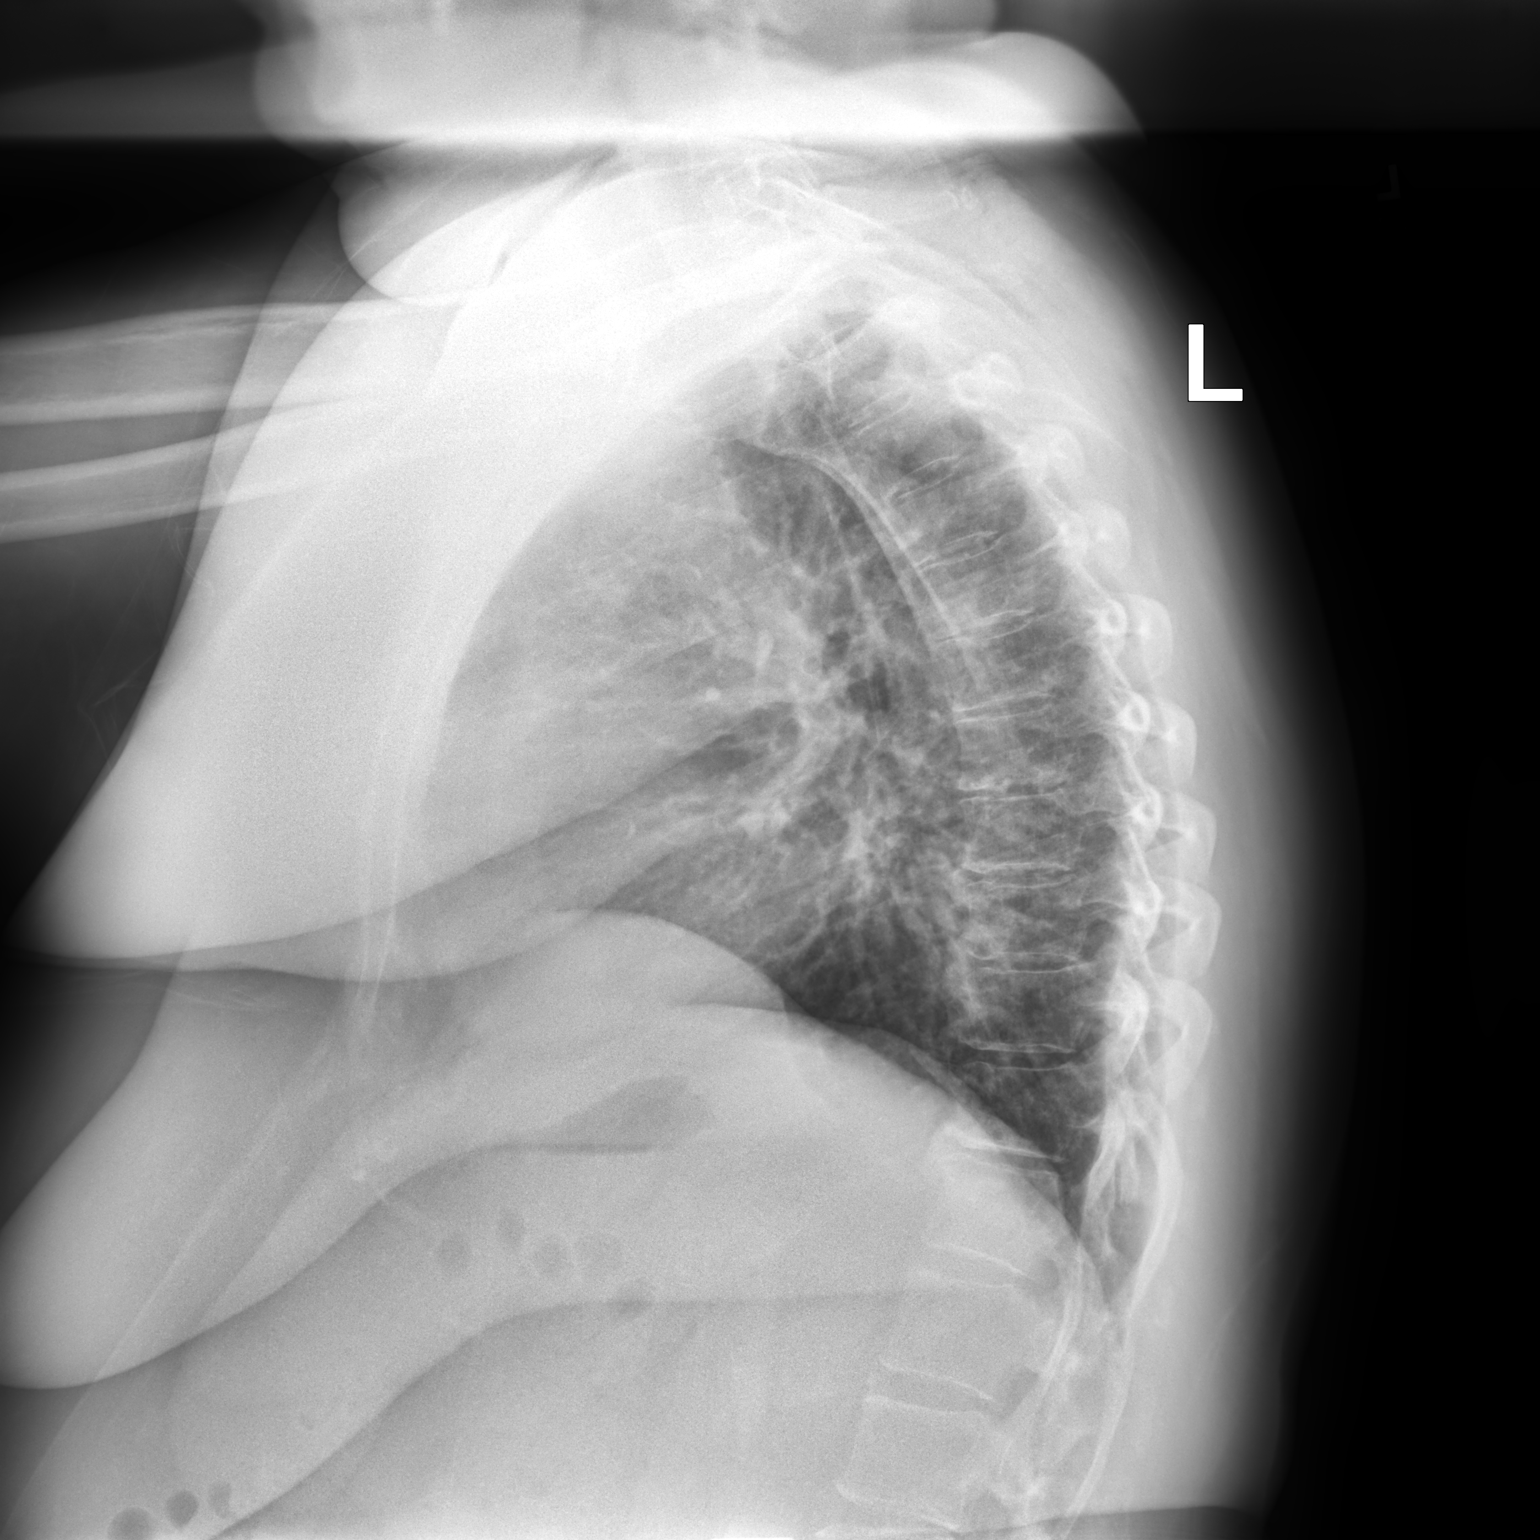

[2 of 2 positions shown; findings below may reference images not displayed]

FINDINGS: The heart size and mediastinal contours are within normal limits.
Both lungs are clear. The visualized skeletal structures are
unremarkable.
IMPRESSION: No active cardiopulmonary disease.

## 2023-06-20 ENCOUNTER — Encounter (HOSPITAL_BASED_OUTPATIENT_CLINIC_OR_DEPARTMENT_OTHER): Payer: Self-pay | Admitting: General Surgery

## 2023-06-20 ENCOUNTER — Other Ambulatory Visit: Payer: Self-pay

## 2023-06-20 ENCOUNTER — Other Ambulatory Visit: Payer: Self-pay | Admitting: General Surgery

## 2023-06-20 DIAGNOSIS — N6489 Other specified disorders of breast: Secondary | ICD-10-CM

## 2023-06-25 ENCOUNTER — Ambulatory Visit
Admission: RE | Admit: 2023-06-25 | Discharge: 2023-06-25 | Disposition: A | Discharge status: Home / Self Care | Payer: Medicare Other | Source: Ambulatory Visit | Attending: General Surgery | Admitting: General Surgery

## 2023-06-25 ENCOUNTER — Other Ambulatory Visit: Payer: Self-pay | Admitting: General Surgery

## 2023-06-25 DIAGNOSIS — N6489 Other specified disorders of breast: Secondary | ICD-10-CM

## 2023-06-25 HISTORY — PX: BREAST BIOPSY: SHX20

## 2023-06-25 MED ORDER — CHLORHEXIDINE GLUCONATE CLOTH 2 % EX PADS: 6.0000 | MEDICATED_PAD | Freq: Once | CUTANEOUS | Status: DC

## 2023-06-25 MED ORDER — ENSURE PRE-SURGERY PO LIQD: 296.0000 mL | Freq: Once | ORAL | Status: DC

## 2023-06-25 NOTE — Progress Notes (Signed)

## 2023-06-26 ENCOUNTER — Encounter (HOSPITAL_BASED_OUTPATIENT_CLINIC_OR_DEPARTMENT_OTHER): Payer: Self-pay | Admitting: General Surgery

## 2023-06-26 ENCOUNTER — Ambulatory Visit
Admission: RE | Admit: 2023-06-26 | Discharge: 2023-06-26 | Disposition: A | Discharge status: Home / Self Care | Payer: Medicare Other | Source: Ambulatory Visit | Attending: General Surgery | Admitting: General Surgery

## 2023-06-26 ENCOUNTER — Ambulatory Visit (HOSPITAL_BASED_OUTPATIENT_CLINIC_OR_DEPARTMENT_OTHER)
Admission: RE | Admit: 2023-06-26 | Discharge: 2023-06-26 | Disposition: A | Discharge status: Home / Self Care | Payer: Medicare Other | Attending: General Surgery | Admitting: General Surgery

## 2023-06-26 ENCOUNTER — Ambulatory Visit (HOSPITAL_BASED_OUTPATIENT_CLINIC_OR_DEPARTMENT_OTHER): Payer: Medicare Other | Admitting: Anesthesiology

## 2023-06-26 ENCOUNTER — Other Ambulatory Visit: Payer: Self-pay

## 2023-06-26 ENCOUNTER — Encounter (HOSPITAL_BASED_OUTPATIENT_CLINIC_OR_DEPARTMENT_OTHER)
Admission: RE | Disposition: A | Discharge status: Home / Self Care | Payer: Self-pay | Source: Home / Self Care | Attending: General Surgery

## 2023-06-26 DIAGNOSIS — N6489 Other specified disorders of breast: Secondary | ICD-10-CM | POA: Diagnosis present

## 2023-06-26 DIAGNOSIS — N6320 Unspecified lump in the left breast, unspecified quadrant: Secondary | ICD-10-CM

## 2023-06-26 DIAGNOSIS — M1990 Unspecified osteoarthritis, unspecified site: Secondary | ICD-10-CM | POA: Insufficient documentation

## 2023-06-26 DIAGNOSIS — G4730 Sleep apnea, unspecified: Secondary | ICD-10-CM | POA: Diagnosis not present

## 2023-06-26 DIAGNOSIS — I10 Essential (primary) hypertension: Secondary | ICD-10-CM | POA: Diagnosis not present

## 2023-06-26 DIAGNOSIS — J45909 Unspecified asthma, uncomplicated: Secondary | ICD-10-CM | POA: Diagnosis not present

## 2023-06-26 DIAGNOSIS — Z8249 Family history of ischemic heart disease and other diseases of the circulatory system: Secondary | ICD-10-CM | POA: Insufficient documentation

## 2023-06-26 DIAGNOSIS — N6012 Diffuse cystic mastopathy of left breast: Secondary | ICD-10-CM | POA: Diagnosis not present

## 2023-06-26 HISTORY — PX: RADIOACTIVE SEED GUIDED EXCISIONAL BREAST BIOPSY: SHX6490

## 2023-06-26 SURGERY — RADIOACTIVE SEED GUIDED BREAST BIOPSY
Anesthesia: General | Site: Breast | Laterality: Left

## 2023-06-26 MED ORDER — ACETAMINOPHEN 500 MG PO TABS
1000.0000 mg | ORAL_TABLET | ORAL | Status: AC
  Administered 2023-06-26: 1000 mg via ORAL

## 2023-06-26 MED ORDER — EPHEDRINE SULFATE (PRESSORS) 50 MG/ML IJ SOLN
INTRAMUSCULAR | Status: DC | PRN
  Administered 2023-06-26: 10 mg via INTRAVENOUS
  Administered 2023-06-26: 5 mg via INTRAVENOUS

## 2023-06-26 MED ORDER — ONDANSETRON HCL 4 MG/2ML IJ SOLN
INTRAMUSCULAR | Status: DC | PRN
  Administered 2023-06-26: 4 mg via INTRAVENOUS

## 2023-06-26 MED ORDER — DEXAMETHASONE SODIUM PHOSPHATE 10 MG/ML IJ SOLN
INTRAMUSCULAR | Status: AC
  Filled 2023-06-26: qty 1

## 2023-06-26 MED ORDER — ONDANSETRON HCL 4 MG/2ML IJ SOLN
INTRAMUSCULAR | Status: AC
  Filled 2023-06-26: qty 2

## 2023-06-26 MED ORDER — LACTATED RINGERS IV SOLN: INTRAVENOUS | Status: DC

## 2023-06-26 MED ORDER — FENTANYL CITRATE (PF) 100 MCG/2ML IJ SOLN
INTRAMUSCULAR | Status: DC | PRN
  Administered 2023-06-26 (×2): 50 ug via INTRAVENOUS

## 2023-06-26 MED ORDER — MIDAZOLAM HCL 2 MG/2ML IJ SOLN
INTRAMUSCULAR | Status: AC
  Filled 2023-06-26: qty 2

## 2023-06-26 MED ORDER — FENTANYL CITRATE (PF) 100 MCG/2ML IJ SOLN
INTRAMUSCULAR | Status: AC
  Filled 2023-06-26: qty 2

## 2023-06-26 MED ORDER — OXYCODONE HCL 5 MG PO TABS
ORAL_TABLET | ORAL | Status: AC
  Filled 2023-06-26: qty 1

## 2023-06-26 MED ORDER — PROPOFOL 10 MG/ML IV BOLUS
INTRAVENOUS | Status: DC | PRN
  Administered 2023-06-26: 200 mg via INTRAVENOUS

## 2023-06-26 MED ORDER — LIDOCAINE 2% (20 MG/ML) 5 ML SYRINGE
INTRAMUSCULAR | Status: DC | PRN
  Administered 2023-06-26: 100 mg via INTRAVENOUS

## 2023-06-26 MED ORDER — CEFAZOLIN IN SODIUM CHLORIDE 3-0.9 GM/100ML-% IV SOLN
3.0000 g | INTRAVENOUS | Status: AC
  Administered 2023-06-26: 3 g via INTRAVENOUS

## 2023-06-26 MED ORDER — EPHEDRINE 5 MG/ML INJ
INTRAVENOUS | Status: AC
  Filled 2023-06-26: qty 5

## 2023-06-26 MED ORDER — DEXAMETHASONE SODIUM PHOSPHATE 10 MG/ML IJ SOLN
INTRAMUSCULAR | Status: DC | PRN
  Administered 2023-06-26: 5 mg via INTRAVENOUS

## 2023-06-26 MED ORDER — LIDOCAINE 2% (20 MG/ML) 5 ML SYRINGE
INTRAMUSCULAR | Status: AC
  Filled 2023-06-26: qty 5

## 2023-06-26 MED ORDER — BUPIVACAINE HCL (PF) 0.25 % IJ SOLN
INTRAMUSCULAR | Status: DC | PRN
  Administered 2023-06-26: 10 mL

## 2023-06-26 MED ORDER — CEFAZOLIN IN SODIUM CHLORIDE 3-0.9 GM/100ML-% IV SOLN: 3.0000 g | INTRAVENOUS | Status: AC

## 2023-06-26 MED ORDER — ACETAMINOPHEN 500 MG PO TABS
ORAL_TABLET | ORAL | Status: AC
  Filled 2023-06-26: qty 2

## 2023-06-26 MED ORDER — OXYCODONE HCL 5 MG PO TABS
5.0000 mg | ORAL_TABLET | Freq: Once | ORAL | Status: AC
  Administered 2023-06-26: 5 mg via ORAL

## 2023-06-26 MED ORDER — PROPOFOL 10 MG/ML IV BOLUS
INTRAVENOUS | Status: AC
  Filled 2023-06-26: qty 20

## 2023-06-26 MED ORDER — MIDAZOLAM HCL 5 MG/5ML IJ SOLN
INTRAMUSCULAR | Status: DC | PRN
  Administered 2023-06-26: 2 mg via INTRAVENOUS

## 2023-06-26 MED ORDER — CEFAZOLIN IN SODIUM CHLORIDE 3-0.9 GM/100ML-% IV SOLN
INTRAVENOUS | Status: AC
  Filled 2023-06-26: qty 100

## 2023-06-26 MED ORDER — SODIUM CHLORIDE 0.9 % IV SOLN: INTRAVENOUS | Status: DC | PRN

## 2023-06-26 SURGICAL SUPPLY — 56 items
ADH SKN CLS APL DERMABOND .7 (GAUZE/BANDAGES/DRESSINGS) ×1
APL PRP STRL LF DISP 70% ISPRP (MISCELLANEOUS) ×1
APPLIER CLIP 9.375 MED OPEN (MISCELLANEOUS)
APR CLP MED 9.3 20 MLT OPN (MISCELLANEOUS)
BINDER BREAST 3XL (GAUZE/BANDAGES/DRESSINGS) IMPLANT
BINDER BREAST LRG (GAUZE/BANDAGES/DRESSINGS) IMPLANT
BINDER BREAST MEDIUM (GAUZE/BANDAGES/DRESSINGS) IMPLANT
BINDER BREAST XLRG (GAUZE/BANDAGES/DRESSINGS) IMPLANT
BINDER BREAST XXLRG (GAUZE/BANDAGES/DRESSINGS) IMPLANT
BLADE SURG 15 STRL LF DISP TIS (BLADE) ×1 IMPLANT
BLADE SURG 15 STRL SS (BLADE) ×1
CANISTER SUC SOCK COL 7IN (MISCELLANEOUS) IMPLANT
CANISTER SUCT 1200ML W/VALVE (MISCELLANEOUS) IMPLANT
CHLORAPREP W/TINT 26 (MISCELLANEOUS) ×1 IMPLANT
CLIP APPLIE 9.375 MED OPEN (MISCELLANEOUS) IMPLANT
CLIP TI WIDE RED SMALL 6 (CLIP) IMPLANT
COVER BACK TABLE 60X90IN (DRAPES) ×1 IMPLANT
COVER MAYO STAND STRL (DRAPES) ×1 IMPLANT
COVER PROBE CYLINDRICAL 5X96 (MISCELLANEOUS) ×1 IMPLANT
DERMABOND ADVANCED .7 DNX12 (GAUZE/BANDAGES/DRESSINGS) ×1 IMPLANT
DRAPE LAPAROSCOPIC ABDOMINAL (DRAPES) ×1 IMPLANT
DRAPE UTILITY XL STRL (DRAPES) ×1 IMPLANT
DRSG TEGADERM 4X4.75 (GAUZE/BANDAGES/DRESSINGS) IMPLANT
ELECT COATED BLADE 2.86 ST (ELECTRODE) ×1 IMPLANT
ELECT REM PT RETURN 9FT ADLT (ELECTROSURGICAL) ×1
ELECTRODE REM PT RTRN 9FT ADLT (ELECTROSURGICAL) ×1 IMPLANT
GAUZE SPONGE 4X4 12PLY STRL LF (GAUZE/BANDAGES/DRESSINGS) IMPLANT
GLOVE BIO SURGEON STRL SZ7 (GLOVE) ×2 IMPLANT
GLOVE BIOGEL PI IND STRL 7.0 (GLOVE) IMPLANT
GLOVE BIOGEL PI IND STRL 7.5 (GLOVE) ×1 IMPLANT
GOWN STRL REUS W/ TWL LRG LVL3 (GOWN DISPOSABLE) ×2 IMPLANT
GOWN STRL REUS W/TWL LRG LVL3 (GOWN DISPOSABLE) ×2
HEMOSTAT ARISTA ABSORB 3G PWDR (HEMOSTASIS) IMPLANT
KIT MARKER MARGIN INK (KITS) ×1 IMPLANT
NDL HYPO 25X1 1.5 SAFETY (NEEDLE) ×1 IMPLANT
NEEDLE HYPO 25X1 1.5 SAFETY (NEEDLE) ×1
NS IRRIG 1000ML POUR BTL (IV SOLUTION) IMPLANT
PACK BASIN DAY SURGERY FS (CUSTOM PROCEDURE TRAY) ×1 IMPLANT
PENCIL SMOKE EVACUATOR (MISCELLANEOUS) ×1 IMPLANT
RETRACTOR ONETRAX LX 90X20 (MISCELLANEOUS) IMPLANT
SLEEVE SCD COMPRESS KNEE MED (STOCKING) ×1 IMPLANT
SPIKE FLUID TRANSFER (MISCELLANEOUS) IMPLANT
SPONGE T-LAP 4X18 ~~LOC~~+RFID (SPONGE) ×1 IMPLANT
STRIP CLOSURE SKIN 1/2X4 (GAUZE/BANDAGES/DRESSINGS) ×1 IMPLANT
SUT MNCRL AB 4-0 PS2 18 (SUTURE) IMPLANT
SUT MON AB 5-0 PS2 18 (SUTURE) IMPLANT
SUT SILK 2 0 SH (SUTURE) IMPLANT
SUT VIC AB 2-0 SH 27 (SUTURE) ×1
SUT VIC AB 2-0 SH 27XBRD (SUTURE) ×1 IMPLANT
SUT VIC AB 3-0 SH 27 (SUTURE) ×1
SUT VIC AB 3-0 SH 27X BRD (SUTURE) ×1 IMPLANT
SYR CONTROL 10ML LL (SYRINGE) ×1 IMPLANT
TOWEL GREEN STERILE FF (TOWEL DISPOSABLE) ×1 IMPLANT
TRAY FAXITRON CT DISP (TRAY / TRAY PROCEDURE) ×1 IMPLANT
TUBE CONNECTING 20X1/4 (TUBING) IMPLANT
YANKAUER SUCT BULB TIP NO VENT (SUCTIONS) IMPLANT

## 2023-06-26 NOTE — Discharge Instructions (Addendum)
[facility] Office Phone Number [phone]  POST OP INSTRUCTIONS Take 400 mg of ibuprofen every 8 hours or 650 mg tylenol every 6 hours for next 72 hours then as needed. Use ice several times daily also.  A prescription for pain medication may be given to you upon discharge.  Take your pain medication as prescribed, if needed.  If narcotic pain medicine is not needed, then you may take acetaminophen (Tylenol), naprosyn (Alleve) or ibuprofen (Advil) as needed. Take your usually prescribed medications unless otherwise directed If you need a refill on your pain medication, please contact your pharmacy.  They will contact our office to request authorization.  Prescriptions will not be filled after 5pm or on week-ends. You should eat very light the first 24 hours after surgery, such as soup, crackers, pudding, etc.  Resume your normal diet the day after surgery. Most patients will experience some swelling and bruising in the breast.  Ice packs and a good support bra will help.  Wear the breast binder provided or a sports bra for 72 hours day and night.  After that wear a sports bra during the day until you return to the office. Swelling and bruising can take several days to resolve.  It is common to experience some constipation if taking pain medication after surgery.  Increasing fluid intake and taking a stool softener will usually help or prevent this problem from occurring.  A mild laxative (Milk of Magnesia or Miralax) should be taken according to package directions if there are no bowel movements after 48 hours. I used skin glue on the incision, you may shower in 24 hours.  The glue will flake off over the next 2-3 weeks.  Any sutures or staples will be removed at the office during your follow-up visit. ACTIVITIES:  You may resume regular daily activities (gradually increasing) beginning the next day.  Wearing a good support bra or sports bra minimizes pain and swelling.  You may have  sexual intercourse when it is comfortable. You may drive when you no longer are taking prescription pain medication, you can comfortably wear a seatbelt, and you can safely maneuver your car and apply brakes. RETURN TO WORK:  ______________________________________________________________________________________ Rebecca Rojas should see your doctor in the office for a follow-up appointment approximately two weeks after your surgery.  Your doctor's nurse will typically make your follow-up appointment when she calls you with your pathology report.  Expect your pathology report 3-4 business days after your surgery.  You may call to check if you do not hear from [place] after three days. OTHER INSTRUCTIONS: _______________________________________________________________________________________________ _____________________________________________________________________________________________________________________________________ _____________________________________________________________________________________________________________________________________ _____________________________________________________________________________________________________________________________________  WHEN TO CALL DR WAKEFIELD: Fever over 101.0 Nausea and/or vomiting. Extreme swelling or bruising. Continued bleeding from incision. Increased pain, redness, or drainage from the incision.  The clinic staff is available to answer your questions during regular business hours.  Please don't hesitate to call and ask to speak to one of the nurses for clinical concerns.  If you have a medical emergency, go to the nearest emergency room or call 911.  A surgeon from [facility] is always on call at the hospital.  For further questions, please visit [email or web address]   Post Anesthesia Home Care Instructions  Activity: Get plenty of rest for the remainder of the day. A responsible individual must stay  with you for 24 hours following the procedure.  For the next 24 hours, DO NOT: -Drive a car -Advertising copywriter -Drink alcoholic beverages -Take any medication unless instructed by  your physician -Make any legal decisions or sign important papers.  Meals: Start with liquid foods such as gelatin or soup. Progress to regular foods as tolerated. Avoid greasy, spicy, heavy foods. If nausea and/or vomiting occur, drink only clear liquids until the nausea and/or vomiting subsides. Call your physician if vomiting continues.  Special Instructions/Symptoms: Your throat may feel dry or sore from the anesthesia or the breathing tube placed in your throat during surgery. If this causes discomfort, gargle with warm salt water. The discomfort should disappear within 24 hours.  If you had a scopolamine patch placed behind your ear for the management of post- operative nausea and/or vomiting:  1. The medication in the patch is effective for 72 hours, after which it should be removed.  Wrap patch in a tissue and discard in the trash. Wash hands thoroughly with soap and water. 2. You may remove the patch earlier than 72 hours if you experience unpleasant side effects which may include dry mouth, dizziness or visual disturbances. 3. Avoid touching the patch. Wash your hands with soap and water after contact with the patch.    May have Tylenol today after 2:20 PM  Patient received oxycodone today at 12:15 PM

## 2023-06-26 NOTE — Interval H&P Note (Signed)
History and Physical Interval Note:  06/26/2023 9:42 AM  Rebecca Rojas  has presented today for surgery, with the diagnosis of LEFT BREAST MASS.  The various methods of treatment have been discussed with the patient and family. After consideration of risks, benefits and other options for treatment, the patient has consented to  Procedure(s) with comments: LEFT BREAST SEED GUIDED EXCISIONAL BIOPSY (Left) - LMA as a surgical intervention.  The patient's history has been reviewed, patient examined, no change in status, stable for surgery.  I have reviewed the patient's chart and labs.  Questions were answered to the patient's satisfaction.     [name]

## 2023-06-26 NOTE — Anesthesia Procedure Notes (Signed)
Procedure Name: LMA Insertion Date/Time: 06/26/2023 10:31 AM  Performed by: Thornell Mule, CRNAPre-anesthesia Checklist: Patient identified, Emergency Drugs available, Suction available and Patient being monitored Patient Re-evaluated:Patient Re-evaluated prior to induction Oxygen Delivery Method: Circle system utilized Preoxygenation: Pre-oxygenation with 100% oxygen Induction Type: IV induction LMA: LMA inserted Number of attempts: 1 Placement Confirmation: positive ETCO2 Tube secured with: Tape Dental Injury: Teeth and Oropharynx as per pre-operative assessment

## 2023-06-26 NOTE — H&P (Signed)
71 yof who had screening mm that showed a left breast mass. No prior breast history, no family history. She has b density breast tissue. Dx views confirmed this and on [place] there is a 1 cm mass. She had a biopsy that shows csl. She is here with her husband to discuss options She has prior svt ablation and osa.   Review of Systems: A complete review of systems was obtained from the patient. I have reviewed this information and discussed as appropriate with the patient. See HPI as well for other ROS.  Review of Systems  Cardiovascular: Positive for palpitations and leg swelling.  All other systems reviewed and are negative.  Medical History: Past Medical History:  Diagnosis Date  Arrhythmia  Asthma, unspecified asthma severity, unspecified whether complicated, unspecified whether persistent (HHS-HCC)  Hyperlipidemia  Hypertension  Sleep apnea   Past Surgical History:  Procedure Laterality Date  HAND SURGERY  HYSTERECTOMY  JOINT REPLACEMENT  SVT ABLATION  TONSILLECTOMY    Allergies  Allergen Reactions  Cefdinir Itching  Erythromycin Other (See Comments)  SVT  Hydrocodone Rash  Metformin Other (See Comments)  BRAIN FOG AND SICK  Sulfa (Sulfonamide Antibiotics) Itching   Current Outpatient Medications on File Prior to Visit  Medication Sig Dispense Refill  aspirin 81 MG EC tablet Take 81 mg by mouth once daily  cyclobenzaprine (FLEXERIL) 10 MG tablet Take 10 mg by mouth 3 (three) times daily as needed  EPINEPHrine (EPIPEN JR) 0.15 mg/0.3 mL auto-injector Inject into the muscle at bedtime as needed  ergocalciferol, vitamin D2, 1,250 mcg (50,000 unit) capsule Take 50,000 Units by mouth every 7 (seven) days  etodolac (LODINE) 400 MG tablet TAKE 1 TABLET (400 MG TOTAL) BY MOUTH AS NEEDED.  ipratropium (ATROVENT HFA) inhaler Inhale 2 inhalations into the lungs 4 (four) times daily  levocetirizine (XYZAL) 5 MG tablet Take 5 mg by mouth every evening  metoprolol succinate  (TOPROL-XL) 25 MG XL tablet Take 25 mg by mouth once daily  montelukast (SINGULAIR) 10 mg tablet Take 10 mg by mouth at bedtime  rosuvastatin (CRESTOR) 10 MG tablet Take 10 mg by mouth once daily  valsartan-hydroCHLOROthiazide (DIOVAN-HCT) 160-12.5 mg tablet Take 1 tablet by mouth once daily  WIXELA INHUB 250-50 mcg/dose diskus inhaler Inhale 1 Puff into the lungs 2 (two) times daily   Family History  Problem Relation Age of Onset  High blood pressure (Hypertension) Mother  Hyperlipidemia (Elevated cholesterol) Mother  Coronary Artery Disease (Blocked arteries around heart) Mother  Obesity Father    Social History   Tobacco Use  Smoking Status Never  Smokeless Tobacco Never  Marital status: Married  Tobacco Use  Smoking status: Never  Smokeless tobacco: Never  Vaping Use  Vaping status: Never Used  Substance and Sexual Activity  Alcohol use: Never  Drug use: Never   Objective:   Vitals:  05/30/23 0912  BP: (!) 142/84  Pulse: 85  Temp: 36.6 C (97.9 F)  Weight: (!) 122.4 kg (269 lb 12.8 oz)  Height: 157.5 cm (5\' 2" )  PainSc: 0-No pain   Body mass index is 49.35 kg/m.  Physical Exam Vitals reviewed.  Constitutional:  Appearance: Normal appearance.  Chest:  Breasts: Right: No inverted nipple, mass or nipple discharge.  Left: No inverted nipple, mass or nipple discharge.  Lymphadenopathy:  Upper Body:  Right upper body: No supraclavicular or axillary adenopathy.  Left upper body: No supraclavicular or axillary adenopathy.  Neurological:  Mental Status: She is alert.    Assessment and  Plan:   Radial scar of breast  Left breast radioactive seed guided excisional biopsy.  We discussed the imaging findings as well as the pathology. I think due to the fact that there is a mass and it does not look completely excised would be most reasonable to do a seed guided excisional biopsy of this area as there is certainly a chance this could be upgraded. We discussed a  radioactive seed guided excisional biopsy with the risks and recovery. We will reach out to cardiology prior to beginning as well.

## 2023-06-26 NOTE — Op Note (Signed)
Preoperative diagnosis: left breast mass with core biopsy c/w csl Postoperative diagnosis: Same as above Procedure: Left breast radioactive seed guided excisional biopsy  Surgeon: Dr. Harden Mo Anesthesia: General Estimated blood loss: 20 cc Specimens:Left breast tissue containing seed and clip marked with paint Complications: None Drains: Sponge and needle count was correct at completion Disposition recovery stable condition   Indications:66 yof who had screening mm that showed a left breast mass. No prior breast history, no family history. She has b density breast tissue. Dx views confirmed this and on [place] there is a 1 cm mass. She had a biopsy that shows csl. We discussed excisional biopsy   Procedure: After informed consent was obtained she was taken to the operating room.   She was placed under general anesthesia without complication.  She had been given antibiotics.  SCDs were in place.  She was prepped and draped in the standard sterile surgical fashion.  Surgical timeout was then performed.   I made an inferior  periareolar incision after infiltrating Marcaine throughout.  I then tunneled to the seed with guidance of neoprobe. The seed and surrounding tissue were then removed.   Mammogram confirmed removal of the seed and the clip.  I then obtained hemostasis.  I closed this cavity with 2-0 Vicryl.  The skin was closed with 3-0 Vicryl and 5-0 Monocryl.  Glue and Steri-Strips were applied.  She tolerated this well was extubated transferred recovery stable.

## 2023-06-26 NOTE — Anesthesia Preprocedure Evaluation (Addendum)
Anesthesia Evaluation  Patient identified by MRN, date of birth, ID band Patient awake    Reviewed: Allergy & Precautions, NPO status , Patient's Chart, lab work & pertinent test results, reviewed documented beta blocker date and time   History of Anesthesia Complications Negative for: history of anesthetic complications  Airway Mallampati: II  TM Distance: >3 FB     Dental no notable dental hx.    Pulmonary neg shortness of breath, asthma , sleep apnea and Continuous Positive Airway Pressure Ventilation , neg recent URI   breath sounds clear to auscultation       Cardiovascular hypertension, (-) CAD, (-) Past MI, (-) Cardiac Stents and (-) CABG  Rhythm:Regular Rate:Normal     Neuro/Psych  Headaches, neg Seizures    GI/Hepatic ,neg GERD  ,,(+) neg Cirrhosis        Endo/Other    Renal/GU Renal disease     Musculoskeletal  (+) Arthritis ,    Abdominal   Peds  Hematology  (+) Blood dyscrasia, anemia   Anesthesia Other Findings   Reproductive/Obstetrics                             Anesthesia Physical Anesthesia Plan  ASA: 2  Anesthesia Plan: General   Post-op Pain Management:    Induction: Intravenous  PONV Risk Score and Plan: 2 and Ondansetron and Dexamethasone  Airway Management Planned: LMA  Additional Equipment:   Intra-op Plan:   Post-operative Plan: Extubation in OR  Informed Consent: I have reviewed the patients History and Physical, chart, labs and discussed the procedure including the risks, benefits and alternatives for the proposed anesthesia with the patient or authorized representative who has indicated his/her understanding and acceptance.     Dental advisory given  Plan Discussed with: CRNA  Anesthesia Plan Comments:        Anesthesia Quick Evaluation

## 2023-06-26 NOTE — Transfer of Care (Signed)
Immediate Anesthesia Transfer of Care Note  Patient: Rebecca Rojas  Procedure(s) Performed: LEFT BREAST SEED GUIDED EXCISIONAL BIOPSY (Left: Breast)  Patient Location: PACU  Anesthesia Type:General  Level of Consciousness: drowsy, patient cooperative, and responds to stimulation  Airway & Oxygen Therapy: Patient Spontanous Breathing and Patient connected to face mask oxygen  Post-op Assessment: Report given to RN and Post -op Vital signs reviewed and stable  Post vital signs: Reviewed and stable  Last Vitals:  Vitals Value Taken Time  BP    Temp    Pulse 83 06/26/23 1113  Resp 21 06/26/23 1113  SpO2 100 % 06/26/23 1113  Vitals shown include unfiled device data.  Last Pain:  Vitals:   06/26/23 0816  TempSrc: Temporal  PainSc: 0-No pain         Complications: No notable events documented.

## 2023-06-27 ENCOUNTER — Encounter (HOSPITAL_BASED_OUTPATIENT_CLINIC_OR_DEPARTMENT_OTHER): Payer: Self-pay | Admitting: General Surgery

## 2023-06-27 NOTE — Anesthesia Postprocedure Evaluation (Signed)
Anesthesia Post Note  Patient: Rebecca Rojas  Procedure(s) Performed: LEFT BREAST SEED GUIDED EXCISIONAL BIOPSY (Left: Breast)     Patient location during evaluation: PACU Anesthesia Type: General Level of consciousness: awake and alert Pain management: pain level controlled Vital Signs Assessment: post-procedure vital signs reviewed and stable Respiratory status: spontaneous breathing, nonlabored ventilation, respiratory function stable and patient connected to nasal cannula oxygen Cardiovascular status: blood pressure returned to baseline and stable Postop Assessment: no apparent nausea or vomiting Anesthetic complications: no   No notable events documented.  Last Vitals:  Vitals:   06/26/23 1145 06/26/23 1208  BP: 132/66 125/83  Pulse: 71 71  Resp: 16 18  Temp:  36.6 C  SpO2: 94% 98%    Last Pain:  Vitals:   06/26/23 1208  TempSrc:   PainSc: 4    Pain Goal:                   Mariann Barter

## 2023-06-28 LAB — SURGICAL PATHOLOGY

## 2023-08-11 ENCOUNTER — Other Ambulatory Visit: Payer: Self-pay | Admitting: Family Medicine

## 2023-08-30 ENCOUNTER — Other Ambulatory Visit: Payer: Self-pay

## 2023-08-30 DIAGNOSIS — E559 Vitamin D deficiency, unspecified: Secondary | ICD-10-CM

## 2023-08-30 DIAGNOSIS — I10 Essential (primary) hypertension: Secondary | ICD-10-CM

## 2023-08-30 MED ORDER — ROSUVASTATIN CALCIUM 10 MG PO TABS: 10.0000 mg | ORAL_TABLET | Freq: Every day | ORAL | 1 refills | Status: DC

## 2023-08-30 MED ORDER — METOPROLOL SUCCINATE ER 25 MG PO TB24: 25.0000 mg | ORAL_TABLET | Freq: Every day | ORAL | 1 refills | Status: DC

## 2023-08-30 MED ORDER — MONTELUKAST SODIUM 10 MG PO TABS: 10.0000 mg | ORAL_TABLET | Freq: Every day | ORAL | 3 refills | Status: DC

## 2023-08-30 MED ORDER — VITAMIN D (ERGOCALCIFEROL) 1.25 MG (50000 UNIT) PO CAPS: 50000.0000 [IU] | ORAL_CAPSULE | ORAL | 1 refills | Status: DC

## 2023-08-30 MED ORDER — ALBUTEROL SULFATE HFA 108 (90 BASE) MCG/ACT IN AERS: 1.0000 | INHALATION_SPRAY | Freq: Four times a day (QID) | RESPIRATORY_TRACT | 3 refills | Status: DC | PRN

## 2023-08-30 MED ORDER — VALSARTAN-HYDROCHLOROTHIAZIDE 160-12.5 MG PO TABS: 1.0000 | ORAL_TABLET | Freq: Every day | ORAL | 1 refills | Status: DC

## 2023-08-30 MED ORDER — LEVOCETIRIZINE DIHYDROCHLORIDE 5 MG PO TABS: 5.0000 mg | ORAL_TABLET | Freq: Every evening | ORAL | 3 refills | Status: AC
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
## 2023-09-30 ENCOUNTER — Encounter: Payer: Self-pay | Admitting: Physician Assistant

## 2023-09-30 ENCOUNTER — Ambulatory Visit (INDEPENDENT_AMBULATORY_CARE_PROVIDER_SITE_OTHER): Payer: Medicare Other | Admitting: Physician Assistant

## 2023-09-30 VITALS — BP 102/70 | HR 79 | Temp 97.9°F | Resp 18 | Ht 62.0 in | Wt 271.4 lb

## 2023-09-30 DIAGNOSIS — I4811 Longstanding persistent atrial fibrillation: Secondary | ICD-10-CM | POA: Diagnosis not present

## 2023-09-30 DIAGNOSIS — R600 Localized edema: Secondary | ICD-10-CM

## 2023-09-30 DIAGNOSIS — R7301 Impaired fasting glucose: Secondary | ICD-10-CM | POA: Diagnosis not present

## 2023-09-30 DIAGNOSIS — G4730 Sleep apnea, unspecified: Secondary | ICD-10-CM

## 2023-09-30 DIAGNOSIS — I119 Hypertensive heart disease without heart failure: Secondary | ICD-10-CM

## 2023-09-30 DIAGNOSIS — E782 Mixed hyperlipidemia: Secondary | ICD-10-CM | POA: Diagnosis not present

## 2023-09-30 DIAGNOSIS — M175 Other unilateral secondary osteoarthritis of knee: Secondary | ICD-10-CM

## 2023-09-30 MED ORDER — FUROSEMIDE 20 MG PO TABS: 20.0000 mg | ORAL_TABLET | Freq: Every day | ORAL | 3 refills | Status: DC

## 2023-09-30 NOTE — Patient Instructions (Signed)
 VISIT SUMMARY:  Today, we discussed your chronic leg swelling, knee osteoarthritis, occasional heart rhythm irregularities, and brittle nails. We reviewed your current medications and lifestyle habits, and we have made some adjustments to better manage your symptoms.  YOUR PLAN:  -LOWER EXTREMITY EDEMA: Lower extremity edema means swelling in your legs, which can be due to various reasons including heart issues, nerve damage, or fluid retention. We will continue your current medication, Furosemide  10mg  daily, and have ordered labs to check your kidney and liver function, as well as your electrolytes. If the lab results are normal, we may refer you to a cardiologist for an echocardiogram to further investigate the cause of the swelling.  -OSTEOARTHRITIS OF THE KNEE: Osteoarthritis is a condition where the protective cartilage that cushions the ends of your bones wears down over time, causing pain and stiffness. You should continue taking Osteo Bi-Flex. If your pain worsens, we may consider knee injections for pain relief. Additionally, using a recumbent bike can help with low-impact exercise and improve your range of motion.  -ATRIAL FIBRILLATION: Atrial fibrillation is an irregular and often rapid heart rate that can increase your risk of strokes, heart failure, and other heart-related complications. You should continue with your current management plan, including monitoring with your personal EKG device.  -BRITTLE NAILS: Brittle nails can be a sign of various underlying conditions, including thyroid  issues. We have ordered thyroid  function tests to rule out any thyroid  problems. In the meantime, you may benefit from taking a collagen supplement to strengthen your nails.  INSTRUCTIONS:  Please follow up in 4 months to monitor your fluid status. Make sure to complete the lab tests for kidney and liver function, electrolytes, and thyroid  function as soon as possible. If you notice any significant  changes in your symptoms or if your swelling worsens, please contact our office immediately.

## 2023-10-01 LAB — CBC WITH DIFFERENTIAL/PLATELET
Basophils Absolute: 0.1 10*3/uL (ref 0.0–0.2)
Basos: 1 %
EOS (ABSOLUTE): 0.2 10*3/uL (ref 0.0–0.4)
Eos: 3 %
Hematocrit: 41.9 % (ref 34.0–46.6)
Hemoglobin: 13.7 g/dL (ref 11.1–15.9)
Immature Grans (Abs): 0 10*3/uL (ref 0.0–0.1)
Immature Granulocytes: 0 %
Lymphocytes Absolute: 3.1 10*3/uL (ref 0.7–3.1)
Lymphs: 34 %
MCH: 29.7 pg (ref 26.6–33.0)
MCHC: 32.7 g/dL (ref 31.5–35.7)
MCV: 91 fL (ref 79–97)
Monocytes Absolute: 0.7 10*3/uL (ref 0.1–0.9)
Monocytes: 8 %
Neutrophils Absolute: 5 10*3/uL (ref 1.4–7.0)
Neutrophils: 54 %
Platelets: 274 10*3/uL (ref 150–450)
RBC: 4.61 x10E6/uL (ref 3.77–5.28)
RDW: 13.3 % (ref 11.7–15.4)
WBC: 9.1 10*3/uL (ref 3.4–10.8)

## 2023-10-01 LAB — COMPREHENSIVE METABOLIC PANEL
ALT: 16 [IU]/L (ref 0–32)
AST: 20 [IU]/L (ref 0–40)
Albumin: 4.3 g/dL (ref 3.9–4.9)
Alkaline Phosphatase: 85 [IU]/L (ref 44–121)
BUN/Creatinine Ratio: 20 (ref 12–28)
BUN: 17 mg/dL (ref 8–27)
Bilirubin Total: 0.8 mg/dL (ref 0.0–1.2)
CO2: 26 mmol/L (ref 20–29)
Calcium: 9.6 mg/dL (ref 8.7–10.3)
Chloride: 99 mmol/L (ref 96–106)
Creatinine, Ser: 0.86 mg/dL (ref 0.57–1.00)
Globulin, Total: 3 g/dL (ref 1.5–4.5)
Glucose: 106 mg/dL — ABNORMAL HIGH (ref 70–99)
Potassium: 4.3 mmol/L (ref 3.5–5.2)
Sodium: 142 mmol/L (ref 134–144)
Total Protein: 7.3 g/dL (ref 6.0–8.5)
eGFR: 74 mL/min/{1.73_m2} (ref >59)

## 2023-10-01 LAB — LIPID PANEL
Chol/HDL Ratio: 2.5 {ratio} (ref 0.0–4.4)
Cholesterol, Total: 128 mg/dL (ref 100–199)
HDL: 52 mg/dL (ref >39)
LDL Chol Calc (NIH): 46 mg/dL (ref 0–99)
Triglycerides: 182 mg/dL — ABNORMAL HIGH (ref 0–149)
VLDL Cholesterol Cal: 30 mg/dL (ref 5–40)

## 2023-10-01 LAB — HEMOGLOBIN A1C
Est. average glucose Bld gHb Est-mCnc: 128 mg/dL
Hgb A1c MFr Bld: 6.1 % — ABNORMAL HIGH (ref 4.8–5.6)

## 2023-10-01 LAB — TSH: TSH: 4.23 u[IU]/mL (ref 0.450–4.500)

## 2023-10-02 ENCOUNTER — Encounter: Payer: Self-pay | Admitting: Physician Assistant

## 2023-10-03 MED ORDER — TIRZEPATIDE-WEIGHT MANAGEMENT 2.5 MG/0.5ML ~~LOC~~ SOAJ: 2.5000 mg | SUBCUTANEOUS | 1 refills | Status: DC

## 2023-10-03 NOTE — Assessment & Plan Note (Signed)
Chronic, bone-on-bone, causing stiffness and locking up. Currently taking Osteo Bi-Flex. -Continue Osteo Bi-Flex. -Consider knee injections for pain management if pain intensifies. -Recommend recumbent bike for low-impact exercise and range of motion.

## 2023-10-03 NOTE — Assessment & Plan Note (Signed)
Will prescribe Zepbound to assist with treatment Continue to monitor for side effects or issues

## 2023-10-03 NOTE — Assessment & Plan Note (Signed)
Well controlled.  Continue to work on eating a healthy diet and exercise.  Labs drawn today.   No major side effects reported, and no issues with compliance. The current medical regimen is effective;  continue present plan with Diovon Will adjust medication as needed depending on labs BP Readings from Last 3 Encounters:  09/30/23 102/70  06/26/23 125/83  05/27/23 138/84

## 2023-10-03 NOTE — Assessment & Plan Note (Signed)
Managed with Cardia, patient monitoring with personal EKG device. Occasional palpitations and sensation of heart beating out of rhythm. -Continue current management.

## 2023-10-03 NOTE — Assessment & Plan Note (Signed)
Continue to monitor diet and exercise Labs drawn today Will adjust treatment depending on results Will consider starting Zepbound Denies being on any other medicine for weight management Will stay with only one medicine for weight management

## 2023-10-03 NOTE — Assessment & Plan Note (Signed)
Well controlled.  Continue to work on eating a healthy diet and exercise.  Labs drawn today.   No major side effects reported, and no issues with compliance. The current medical regimen is effective;  continue present plan with Crestor 10mg  Will adjust medication as needed depending on labs Lab Results  Component Value Date   LDLCALC 46 09/30/2023

## 2023-10-03 NOTE — Assessment & Plan Note (Signed)
Labs drawn today Continue to monitor diet and exercise Will adjust treatment depending on results
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
## 2023-10-08 ENCOUNTER — Other Ambulatory Visit: Payer: Self-pay | Admitting: Family Medicine

## 2023-10-08 DIAGNOSIS — E559 Vitamin D deficiency, unspecified: Secondary | ICD-10-CM

## 2023-10-22 ENCOUNTER — Other Ambulatory Visit: Payer: Self-pay

## 2023-10-22 DIAGNOSIS — G4730 Sleep apnea, unspecified: Secondary | ICD-10-CM

## 2023-11-11 ENCOUNTER — Other Ambulatory Visit: Payer: Self-pay | Admitting: Family Medicine

## 2024-01-01 ENCOUNTER — Other Ambulatory Visit: Payer: Self-pay | Admitting: Family Medicine

## 2024-01-06 ENCOUNTER — Encounter: Payer: Self-pay | Admitting: Nurse Practitioner

## 2024-01-06 ENCOUNTER — Ambulatory Visit: Admitting: Nurse Practitioner

## 2024-01-06 VITALS — BP 124/84 | HR 43 | Ht 62.0 in | Wt 274.8 lb

## 2024-01-06 DIAGNOSIS — G4730 Sleep apnea, unspecified: Secondary | ICD-10-CM

## 2024-01-06 DIAGNOSIS — Z9981 Dependence on supplemental oxygen: Secondary | ICD-10-CM

## 2024-01-06 DIAGNOSIS — G4733 Obstructive sleep apnea (adult) (pediatric): Secondary | ICD-10-CM

## 2024-01-06 DIAGNOSIS — J4530 Mild persistent asthma, uncomplicated: Secondary | ICD-10-CM

## 2024-01-06 NOTE — Progress Notes (Signed)
 @Patient  ID: Rebecca Rojas, female    DOB: May 12, 1956, 68 y.o.   MRN: [id number]  Chief Complaint  Patient presents with   Follow-up    OSA follow-up    Referring provider: Mercy Stall, MD  HPI: 68 year old female, never smoker followed for obstructive sleep apnea on CPAP.  She is a former patient of Dr. Carlyle Childes and last seen in office 02/20/2023 by Cleo Dace NP.  Past medical history significant for asthma follwoed by Dr. Almeda Jacobs, hypertension, migraines, A-fib not on anticoagulation therapy, allergic rhinitis, osteoarthritis, morbid obesity, vitamin D  deficiency, HLD, vertigo.  TEST/EVENTS:  07/28/2015 sleep study: AHI 15.2, SPO2 nadir 79%  12/11/2022: OV with Dr. Matilde Son.  She had to use a Respironics machine that broke.  Finally received a replacement device.  She was using her husband's old machine in the interim.  Purchasing her own supplies on [place] because she never received follow-up from previous DME.  Has nasal pillow mask.  No issues with breathing, sinus congestion, cough or chest congestion.  Compliant with CPAP and reports benefit from use.  Needs a new DME.  Also needs a new CPAP device.  Orders placed for [facility]Med CPAP at night cmH2O.  02/20/2023: Ov with Daeshawn Redmann NP for follow-up after receiving new CPAP machine.  She is doing well with this.  No concerns or complaints.  She has missed some nights when her husband and her have gone on vacation.  The cabin that they typically stay in is limited on power outlets and her husband is also on a CPAP.  They tend to switch off using his CPAP.  She receives benefit from use.  Denies any drowsy driving or morning headaches.  No significant leaks.  Asthma has been doing well.  Follows with Dr. Almeda Jacobs.  They switched her to [place] due to insurance issues. 01/21/2023-02/19/2023: CPAP 9 cmH2O 24/30 days; 73% >4 hr; av use 6 hr 48 min Leaks 95th 6.4 AHI 0.2  01/06/2024: Today - follow up Patient presents today for yearly follow-up.  Wearing CPAP most  nights.  Has had some issues with her sinuses during allergy season and so sometimes she was taking it off partway through the night.  Otherwise she sleeps with it all night long.  No issues with current pressure settings.  Feels well rested with use of CPAP.  Energy levels are good during the day.  The only time that she does not use it is when they go camping and have limited power outlets.  No issues with drowsy driving. Her asthma has been well-controlled.  Using Wixela twice a day.  Has not had to use her rescue inhaler recently.  Allergies have been well-managed with Dr. Almeda Jacobs.  12/04/2023-01/02/2024 CPAP 9 cmH2O 26/30 days; 70% >4 hr; average use 5 hours 54 minutes Leaks 95th 4.7 AHI 0.1  Allergies  Allergen Reactions   Sulfa Antibiotics Itching   Metformin  And Related Other (See Comments)    Caused brain fog   Erythromycin     "May have contributed to my SVTS"   Hydrocodone Bit-Homatrop Mbr Rash    Immunization History  Administered Date(s) Administered   Fluad Trivalent(High Dose 65+) 05/27/2023   Influenza Inj Mdck Quad Pf 04/20/2019, 05/23/2020   Influenza, High Dose Seasonal PF 10/03/2021, 05/08/2022   Influenza,inj,Quad PF,6+ Mos 05/24/2010, 04/18/2018   Influenza,inj,quad, With Preservative 05/23/2020   Influenza-Unspecified 08/29/2016, 04/18/2018, 09/05/2021   PFIZER(Purple Top)SARS-COV-2 Vaccination 11/07/2019, 11/27/2019, 12/28/2019, 03/20/2021   PNEUMOCOCCAL CONJUGATE-20 12/11/2021   Pfizer Covid-19 Theatre manager  30yrs & up 09/05/2021, 05/08/2022   Pfizer(Comirnaty)Fall Seasonal Vaccine 12 years and older 05/27/2023   Pneumococcal Conjugate-13 12/29/2015   Pneumococcal Polysaccharide-23 06/24/2009, 10/03/2021   Rsv, Bivalent, Protein Subunit Rsvpref,pf Pattricia Bores) 02/18/2023   Tdap 10/01/2012, 02/18/2023   Zoster Recombinant(Shingrix) 02/15/2021, 04/25/2021   Zoster, Unspecified 01/19/2018    Past Medical History:  Diagnosis Date   Allergic rhinitis  02/15/2021   Allergic rhinitis due to animal (cat) (dog) hair and dander 02/15/2021   Allergic rhinitis due to pollen 02/15/2021   Allergy    dust, environmental, feathers, has epipen , prn   Allergy-induced asthma    Anemia    Arthritis    osteoarthritis   Arthritis of right acromioclavicular joint 06/15/2019   Atrial fibrillation (HCC) 05/20/2020   Back pain    Benign essential hypertension 12/28/2015   Bilateral lower extremity edema 08/04/2018   Chronic allergic conjunctivitis 02/15/2021   Glenoid labral tear, right, initial encounter 06/15/2019   Headache    Hordeolum internum of right lower eyelid 02/14/2017   Hypertensive heart disease 06/02/2020   Insulin  resistance 10/01/2019   Joint pain    Migraine headache 12/28/2015   Mixed hyperlipidemia 12/28/2015   Morbid obesity (HCC)    Myalgia 12/28/2015   Osteoarthritis    Rotator cuff tear, right 06/15/2019   Skin lesion of foot 08/30/2016   Sleep apnea    uses cpap   Vertigo    Vitamin D  deficiency     Tobacco History: Social History   Tobacco Use  Smoking Status Never   Passive exposure: Never  Smokeless Tobacco Never   Counseling given: Not Answered   Outpatient Medications Prior to Visit  Medication Sig Dispense Refill   albuterol  (VENTOLIN  HFA) 108 (90 Base) MCG/ACT inhaler Inhale 1-2 puffs into the lungs every 6 (six) hours as needed for wheezing or shortness of breath. 54 g 3   aspirin  EC 81 MG tablet Take 81 mg by mouth daily. Swallow whole.     azelastine (OPTIVAR) 0.05 % ophthalmic solution Place 1 drop into both eyes as needed.     furosemide  (LASIX ) 20 MG tablet Take 1 tablet (20 mg total) by mouth daily. 30 tablet 3   levocetirizine (XYZAL ) 5 MG tablet Take 1 tablet (5 mg total) by mouth every evening. 90 tablet 3   metoprolol  succinate (TOPROL -XL) 25 MG 24 hr tablet TAKE 1 TABLET BY MOUTH DAILY 90 tablet 3   montelukast  (SINGULAIR ) 10 MG tablet Take 1 tablet (10 mg total) by mouth at bedtime. 90  tablet 3   rosuvastatin  (CRESTOR ) 10 MG tablet TAKE 1 TABLET BY MOUTH DAILY 90 tablet 3   valsartan -hydrochlorothiazide  (DIOVAN -HCT) 160-12.5 MG tablet Take 1 tablet by mouth daily. 90 tablet 1   Vitamin D , Ergocalciferol , (DRISDOL ) 1.25 MG (50000 UNIT) CAPS capsule TAKE 1 CAPSULE BY MOUTH EVERY 7  DAYS 13 capsule 3   WIXELA INHUB 250-50 MCG/ACT AEPB Inhale 1 puff into the lungs 2 (two) times daily.     tirzepatide  (ZEPBOUND ) 2.5 MG/0.5ML Pen Inject 2.5 mg into the skin once a week. 2 mL 1   No facility-administered medications prior to visit.     Review of Systems:   Constitutional: No weight loss or gain, night sweats, fevers, chills, fatigue, or lassitude. HEENT: No headaches, difficulty swallowing, tooth/dental problems, or sore throat. No sneezing, itching, ear ache, nasal congestion, or post nasal drip CV:  No chest pain, orthopnea, PND, swelling in lower extremities, anasarca, dizziness, palpitations, syncope Resp: No shortness of breath  with exertion or at rest. No excess mucus or change in color of mucus. No productive or non-productive. No hemoptysis. No wheezing.  No chest wall deformity Skin: No rash, lesions, ulcerations MSK:  No joint swelling.  No decreased range of motion.  No back pain. Neuro: No dizziness or lightheadedness.  Psych: No depression or anxiety. Mood stable.     Physical Exam:  BP 124/84 (BP Location: Left Arm, Patient Position: Sitting)   Pulse (!) 43   Ht 5\' 2"  (1.575 m)   Wt 274 lb 12.8 oz (124.6 kg)   SpO2 94%   BMI 50.26 kg/m   GEN: Pleasant, interactive, well-appearing; morbidly obese; in no acute distress. HEENT:  Normocephalic and atraumatic. PERRLA. Sclera white. Nasal turbinates pink, moist and patent bilaterally. No rhinorrhea present. Oropharynx pink and moist, without exudate or edema. No lesions, ulcerations, or postnasal drip.  NECK:  Supple w/ fair ROM. No JVD present. Normal carotid impulses w/o bruits. Thyroid  symmetrical with no  goiter or nodules palpated. No lymphadenopathy.   CV: RRR, no m/r/g, no peripheral edema. Pulses intact, +2 bilaterally. No cyanosis, pallor or clubbing. PULMONARY:  Unlabored, regular breathing. Clear bilaterally A&P w/o wheezes/rales/rhonchi. No accessory muscle use. No dullness to percussion. GI: BS present and normoactive. Soft, non-tender to palpation.  Neuro: A/Ox3. No focal deficits noted.   Skin: Warm, no lesions or rashe Psych: Normal affect and behavior. Judgement and thought content appropriate.     Lab Results:  CBC    Component Value Date/Time   WBC 9.1 09/30/2023 0934   WBC 9.4 06/12/2019 1531   RBC 4.61 09/30/2023 0934   RBC 4.68 06/12/2019 1531   HGB 13.7 09/30/2023 0934   HCT 41.9 09/30/2023 0934   PLT 274 09/30/2023 0934   MCV 91 09/30/2023 0934   MCH 29.7 09/30/2023 0934   MCH 29.5 06/12/2019 1531   MCHC 32.7 09/30/2023 0934   MCHC 31.4 06/12/2019 1531   RDW 13.3 09/30/2023 0934   LYMPHSABS 3.1 09/30/2023 0934   MONOABS 0.7 02/11/2013 1810   EOSABS 0.2 09/30/2023 0934   BASOSABS 0.1 09/30/2023 0934    BMET    Component Value Date/Time   NA 142 09/30/2023 0934   K 4.3 09/30/2023 0934   CL 99 09/30/2023 0934   CO2 26 09/30/2023 0934   GLUCOSE 106 (H) 09/30/2023 0934   GLUCOSE 92 02/11/2013 1810   BUN 17 09/30/2023 0934   CREATININE 0.86 09/30/2023 0934   CALCIUM  9.6 09/30/2023 0934   GFRNONAA 93 06/13/2020 0936   GFRAA 107 06/13/2020 0936    BNP No results found for: "BNP"   Imaging:  No results found.  Administration History     None           No data to display          No results found for: "NITRICOXIDE"      Assessment & Plan:   Sleep apnea with use of continuous positive airway pressure (CPAP) Moderate OSA on CPAP. Good compliance and control. Advise to use saline nasal gel for dry nasal passages and continue current allergy regimen. Aware of risks of untreated OSA. Understands proper care/use of device. Healthy  weight loss encouraged. Safe driving practices reviewed. Order placed to refill supplies.  Patient Instructions  Continue to use CPAP every night, minimum of 4-6 hours a night.  Change equipment as directed. Wash your tubing with warm soap and water daily, hang to dry. Wash humidifier portion weekly. Use bottled, distilled water  and change daily Be aware of reduced alertness and do not drive or operate heavy machinery if experiencing this or drowsiness.  Exercise encouraged, as tolerated. Healthy weight management discussed.  Avoid or decrease alcohol consumption and medications that make you more sleepy, if possible. Notify if persistent daytime sleepiness occurs even with consistent use of PAP therapy.  We discussed how untreated sleep apnea puts an individual at risk for cardiac arrhthymias, pulm HTN, DM, stroke and increases their risk for daytime accidents.   Try saline nasal gel at bedtime before you put your CPAP mask on   Continue Albuterol  inhaler 2 puffs every 6 hours as needed for shortness of breath or wheezing. Notify if symptoms persist despite rescue inhaler/neb use. Continue Wixela 1 puff Twice daily. Brush tongue and rinse mouth afterwards -Continue singulair  10 mg At bedtime -Continue Xyzal  5 mg daily  Follow up in 1 year with Dr. Gaynell Keeler (new pt) or Katie Aanya Haynes,NP. If symptoms worsen, please contact office for sooner follow up or seek emergency care.    Allergy-induced asthma, mild persistent, uncomplicated Stable. Follows with Dr. Almeda Jacobs. Action plan in place.     I spent 28 minutes of dedicated to the care of this patient on the date of this encounter to include pre-visit review of records, face-to-face time with the patient discussing conditions above, post visit ordering of testing, clinical documentation with the electronic health record, making appropriate referrals as documented, and communicating necessary findings to members of the patients care  team.  Roetta Clarke, NP 01/06/2024  Pt aware and understands NP's role.

## 2024-01-06 NOTE — Patient Instructions (Addendum)
 Continue to use CPAP every night, minimum of 4-6 hours a night.  Change equipment as directed. Wash your tubing with warm soap and water daily, hang to dry. Wash humidifier portion weekly. Use bottled, distilled water and change daily Be aware of reduced alertness and do not drive or operate heavy machinery if experiencing this or drowsiness.  Exercise encouraged, as tolerated. Healthy weight management discussed.  Avoid or decrease alcohol consumption and medications that make you more sleepy, if possible. Notify if persistent daytime sleepiness occurs even with consistent use of PAP therapy.  We discussed how untreated sleep apnea puts an individual at risk for cardiac arrhthymias, pulm HTN, DM, stroke and increases their risk for daytime accidents.   Try saline nasal gel at bedtime before you put your CPAP mask on   Continue Albuterol  inhaler 2 puffs every 6 hours as needed for shortness of breath or wheezing. Notify if symptoms persist despite rescue inhaler/neb use. Continue Wixela 1 puff Twice daily. Brush tongue and rinse mouth afterwards -Continue singulair  10 mg At bedtime -Continue Xyzal  5 mg daily  Follow up in 1 year with Dr. Gaynell Keeler (new pt) or Katie Genoa Freyre,NP. If symptoms worsen, please contact office for sooner follow up or seek emergency care.

## 2024-01-06 NOTE — Assessment & Plan Note (Signed)
 Moderate OSA on CPAP. Good compliance and control. Advise to use saline nasal gel for dry nasal passages and continue current allergy regimen. Aware of risks of untreated OSA. Understands proper care/use of device. Healthy weight loss encouraged. Safe driving practices reviewed. Order placed to refill supplies.  Patient Instructions  Continue to use CPAP every night, minimum of 4-6 hours a night.  Change equipment as directed. Wash your tubing with warm soap and water daily, hang to dry. Wash humidifier portion weekly. Use bottled, distilled water and change daily Be aware of reduced alertness and do not drive or operate heavy machinery if experiencing this or drowsiness.  Exercise encouraged, as tolerated. Healthy weight management discussed.  Avoid or decrease alcohol consumption and medications that make you more sleepy, if possible. Notify if persistent daytime sleepiness occurs even with consistent use of PAP therapy.  We discussed how untreated sleep apnea puts an individual at risk for cardiac arrhthymias, pulm HTN, DM, stroke and increases their risk for daytime accidents.   Try saline nasal gel at bedtime before you put your CPAP mask on   Continue Albuterol  inhaler 2 puffs every 6 hours as needed for shortness of breath or wheezing. Notify if symptoms persist despite rescue inhaler/neb use. Continue Wixela 1 puff Twice daily. Brush tongue and rinse mouth afterwards -Continue singulair  10 mg At bedtime -Continue Xyzal  5 mg daily  Follow up in 1 year with Dr. Gaynell Keeler (new pt) or Katie Arly Salminen,NP. If symptoms worsen, please contact office for sooner follow up or seek emergency care.

## 2024-01-06 NOTE — Assessment & Plan Note (Signed)
 Stable. Follows with Dr. Almeda Jacobs. Action plan in place.

## 2024-01-08 ENCOUNTER — Other Ambulatory Visit: Payer: Self-pay

## 2024-01-08 ENCOUNTER — Encounter: Payer: Self-pay | Admitting: Physician Assistant

## 2024-01-08 ENCOUNTER — Ambulatory Visit (INDEPENDENT_AMBULATORY_CARE_PROVIDER_SITE_OTHER): Payer: Medicare Other | Admitting: Physician Assistant

## 2024-01-08 VITALS — BP 102/60 | HR 81 | Temp 97.8°F | Resp 16 | Ht 62.0 in | Wt 270.4 lb

## 2024-01-08 DIAGNOSIS — G4730 Sleep apnea, unspecified: Secondary | ICD-10-CM

## 2024-01-08 DIAGNOSIS — M175 Other unilateral secondary osteoarthritis of knee: Secondary | ICD-10-CM

## 2024-01-08 DIAGNOSIS — R7301 Impaired fasting glucose: Secondary | ICD-10-CM

## 2024-01-08 DIAGNOSIS — M62838 Other muscle spasm: Secondary | ICD-10-CM | POA: Insufficient documentation

## 2024-01-08 DIAGNOSIS — I4891 Unspecified atrial fibrillation: Secondary | ICD-10-CM | POA: Diagnosis not present

## 2024-01-08 DIAGNOSIS — I119 Hypertensive heart disease without heart failure: Secondary | ICD-10-CM | POA: Diagnosis not present

## 2024-01-08 DIAGNOSIS — R600 Localized edema: Secondary | ICD-10-CM | POA: Diagnosis not present

## 2024-01-08 DIAGNOSIS — I10 Essential (primary) hypertension: Secondary | ICD-10-CM

## 2024-01-08 DIAGNOSIS — E782 Mixed hyperlipidemia: Secondary | ICD-10-CM

## 2024-01-08 MED ORDER — TIRZEPATIDE-WEIGHT MANAGEMENT 2.5 MG/0.5ML ~~LOC~~ SOLN: 2.5000 mg | SUBCUTANEOUS | 1 refills | Status: DC

## 2024-01-08 MED ORDER — VALSARTAN-HYDROCHLOROTHIAZIDE 160-12.5 MG PO TABS
1.0000 | ORAL_TABLET | Freq: Every day | ORAL | 1 refills | Status: DC
Start: 2024-01-08 — End: 2024-01-20

## 2024-01-08 MED ORDER — TIRZEPATIDE-WEIGHT MANAGEMENT 2.5 MG/0.5ML ~~LOC~~ SOLN
2.5000 mg | SUBCUTANEOUS | 1 refills | Status: DC
Start: 2024-01-08 — End: 2024-01-08

## 2024-01-08 NOTE — Assessment & Plan Note (Signed)
 Intermittent edema with weight fluctuations. Furosemide  used as needed. Compression socks recommended. - Use furosemide  as needed. - Wear compression socks.

## 2024-01-08 NOTE — Patient Instructions (Signed)
 VISIT SUMMARY:  During your follow-up visit, we discussed your chronic knee pain, neck muscle spasms, intermittent swelling, and sleep apnea management. We also reviewed your current medications, dietary adjustments, and recent pulmonologist visit for CPAP supplies.  YOUR PLAN:  -KNEE OSTEOARTHRITIS: Knee osteoarthritis is a condition where the cartilage in your knee joint wears down over time, causing pain and swelling. You received a cortisone injection recently, which has helped with mobility. Continue taking meloxicam for pain and inflammation. We will reassess the cortisone injection on June 18th and consider gel injections if needed.  -EDEMA: Edema is swelling caused by excess fluid trapped in your body's tissues. You are managing this with furosemide  as needed and compression socks. Continue using these methods to control the swelling.  -MUSCLE SPASMS: Muscle spasms are sudden, involuntary contractions of a muscle. Your neck spasms may be due to cramping and possibly an electrolyte imbalance. We recommend massage and stretching exercises. We will also check your electrolyte levels and consider Liquid IV for supplementation.  -OBSTRUCTIVE SLEEP APNEA: Obstructive sleep apnea is a condition where your breathing stops and starts during sleep. You are managing this with CPAP therapy. We will resubmit the Zepbound  prescription under sleep apnea and continue with your current CPAP therapy.  -HYPERLIPIDEMIA: Hyperlipidemia is having high levels of fats (lipids) in your blood. Your LDL levels are well-controlled, but your triglycerides are slightly elevated. Continue managing your diet and monitoring your lipid levels.  INSTRUCTIONS:  Follow up with the orthopedic specialist on June 18th for reassessment of your cortisone injection. Continue using furosemide  and compression socks as needed for swelling. Get your electrolyte levels checked and consider using Liquid IV for supplementation. We will  resubmit the Zepbound  prescription under sleep apnea. Continue with your current CPAP therapy and dietary management for hyperlipidemia.

## 2024-01-08 NOTE — Assessment & Plan Note (Signed)
 Intermittent left neck muscle spasms likely due to cramping. No numbness or tingling. Possible electrolyte imbalance from furosemide  and caffeine. Recommended massage and stretching. Electrolyte levels to be checked. - Order labs for electrolyte levels. - Recommend massage and stretching. - Consider Liquid IV for electrolyte supplementation.

## 2024-01-08 NOTE — Assessment & Plan Note (Signed)
 Managed with CPAP. Recent pulmonologist visit for CPAP supplies. Insurance issues with Zepbound  due to non-diabetic status. Plan to resubmit under sleep apnea. - Resubmit Zepbound  prescription under sleep apnea. - Continue CPAP therapy.

## 2024-01-08 NOTE — Assessment & Plan Note (Signed)
 Well-controlled LDL levels. Slightly elevated triglycerides not concerning due to dietary fluctuations. Diet managed with app. - Continue dietary management. - Monitor lipid levels.

## 2024-01-08 NOTE — Assessment & Plan Note (Signed)
 Controlled Continue to monitor for any new symptoms Continue taking Metoprolol  25mg  Will adjust treatment based on symptoms
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
## 2024-01-08 NOTE — Assessment & Plan Note (Signed)
 Labs drawn today Continue to monitor diet and exercise Will adjust treatment based on results

## 2024-01-08 NOTE — Assessment & Plan Note (Signed)
 Chronic knee osteoarthritis with recent cortisone injection. Improved mobility but persistent swelling. Cortisone reassessment on June 18th. Gel injections considered if ineffective. Meloxicam prescribed for pain and inflammation. - Continue meloxicam. - Follow-up with orthopedic specialist on June 18th. - Consider gel injections if cortisone is ineffective.

## 2024-01-08 NOTE — Assessment & Plan Note (Signed)
 Well controlled.  Continue to work on eating a healthy diet and exercise.  Labs drawn today.   No major side effects reported, and no issues with compliance. The current medical regimen is effective;  continue present plan with Diovon Will adjust medication as needed depending on labs BP Readings from Last 3 Encounters:  01/08/24 102/60  01/06/24 124/84  09/30/23 102/70

## 2024-01-09 ENCOUNTER — Other Ambulatory Visit: Payer: Self-pay | Admitting: Physician Assistant

## 2024-01-09 DIAGNOSIS — G4730 Sleep apnea, unspecified: Secondary | ICD-10-CM

## 2024-01-09 LAB — CMP14+EGFR
ALT: 16 IU/L (ref 0–32)
AST: 18 IU/L (ref 0–40)
Albumin: 4.3 g/dL (ref 3.9–4.9)
Alkaline Phosphatase: 82 IU/L (ref 44–121)
BUN/Creatinine Ratio: 27 (ref 12–28)
BUN: 27 mg/dL (ref 8–27)
Bilirubin Total: 0.9 mg/dL (ref 0.0–1.2)
CO2: 25 mmol/L (ref 20–29)
Calcium: 9.3 mg/dL (ref 8.7–10.3)
Chloride: 98 mmol/L (ref 96–106)
Creatinine, Ser: 1.01 mg/dL — ABNORMAL HIGH (ref 0.57–1.00)
Globulin, Total: 2.4 g/dL (ref 1.5–4.5)
Glucose: 101 mg/dL — ABNORMAL HIGH (ref 70–99)
Potassium: 4.4 mmol/L (ref 3.5–5.2)
Sodium: 140 mmol/L (ref 134–144)
Total Protein: 6.7 g/dL (ref 6.0–8.5)
eGFR: 61 mL/min/{1.73_m2} (ref >59)

## 2024-01-09 LAB — CBC WITH DIFFERENTIAL/PLATELET
Basophils Absolute: 0.1 10*3/uL (ref 0.0–0.2)
Basos: 1 %
EOS (ABSOLUTE): 0.2 10*3/uL (ref 0.0–0.4)
Eos: 2 %
Hematocrit: 41.7 % (ref 34.0–46.6)
Hemoglobin: 13.7 g/dL (ref 11.1–15.9)
Immature Grans (Abs): 0 10*3/uL (ref 0.0–0.1)
Immature Granulocytes: 0 %
Lymphocytes Absolute: 4.1 10*3/uL — ABNORMAL HIGH (ref 0.7–3.1)
Lymphs: 36 %
MCH: 30.2 pg (ref 26.6–33.0)
MCHC: 32.9 g/dL (ref 31.5–35.7)
MCV: 92 fL (ref 79–97)
Monocytes Absolute: 0.7 10*3/uL (ref 0.1–0.9)
Monocytes: 6 %
Neutrophils Absolute: 6.2 10*3/uL (ref 1.4–7.0)
Neutrophils: 55 %
Platelets: 324 10*3/uL (ref 150–450)
RBC: 4.53 x10E6/uL (ref 3.77–5.28)
RDW: 13.3 % (ref 11.7–15.4)
WBC: 11.3 10*3/uL — ABNORMAL HIGH (ref 3.4–10.8)

## 2024-01-09 LAB — LIPID PANEL
Chol/HDL Ratio: 2.2 ratio (ref 0.0–4.4)
Cholesterol, Total: 115 mg/dL (ref 100–199)
HDL: 53 mg/dL (ref >39)
LDL Chol Calc (NIH): 42 mg/dL (ref 0–99)
Triglycerides: 108 mg/dL (ref 0–149)
VLDL Cholesterol Cal: 20 mg/dL (ref 5–40)

## 2024-01-09 LAB — HEMOGLOBIN A1C
Est. average glucose Bld gHb Est-mCnc: 120 mg/dL
Hgb A1c MFr Bld: 5.8 % — ABNORMAL HIGH (ref 4.8–5.6)

## 2024-01-09 LAB — TSH: TSH: 4.02 u[IU]/mL (ref 0.450–4.500)

## 2024-01-09 MED ORDER — ZEPBOUND 2.5 MG/0.5ML ~~LOC~~ SOAJ: 2.5000 mg | SUBCUTANEOUS | 0 refills | Status: DC

## 2024-01-09 NOTE — Addendum Note (Signed)
 Addended by: Giovoni Bunch M on: 01/09/2024 01:32 PM   Modules accepted: Orders

## 2024-01-10 ENCOUNTER — Other Ambulatory Visit: Payer: Self-pay

## 2024-01-14 ENCOUNTER — Ambulatory Visit: Payer: Self-pay | Admitting: Physician Assistant

## 2024-01-19 NOTE — Progress Notes (Unsigned)
  Electrophysiology Office Note:   Date:  01/20/2024  ID:  Rebecca Rojas, DOB 1956/02/28, MRN [id number]  Primary Cardiologist: Zoe Hinds, MD Primary Heart Failure: None Electrophysiologist: Arnette Driggs Cortland Ding, MD      History of Present Illness:   Rebecca Rojas is a 68 y.o. female with h/o atrial fibrillation, obstructive sleep apnea, hypertension, SVT seen today for routine electrophysiology followup.   Since last being seen in our clinic the patient reports doing well.  She has noted no further episodes of SVT.  She has had some times where she feels that her blood pressure has been low, systolics in the 90s.  She potentially feels tired and fatigued when this occurs.  Aside from that, she has no acute complaints.  she denies chest pain, palpitations, dyspnea, PND, orthopnea, nausea, vomiting, dizziness, syncope, edema, weight gain, or early satiety.   Review of systems complete and found to be negative unless listed in HPI.   EP Information / Studies Reviewed:    EKG is ordered today. Personal review as below.  EKG Interpretation Date/Time:  Monday January 20 2024 08:35:48 EDT Ventricular Rate:  78 PR Interval:  140 QRS Duration:  114 QT Interval:  406 QTC Calculation: 462 R Axis:   -6  Text Interpretation: Normal sinus rhythm Incomplete right bundle branch block When compared with ECG of 06-Dec-2020 13:33, QT has shortened No significant change since last tracing Confirmed by Melvinia Ashby ([id number]) on 01/20/2024 8:38:29 AM     Risk Assessment/Calculations:    CHA2DS2-VASc Score = 3   This indicates a 3.2% annual risk of stroke. The patient's score is based upon:        Physical Exam:   VS:  BP 106/62   Pulse 78   Ht 5\' 2"  (1.575 m)   Wt 275 lb (124.7 kg)   SpO2 94%   BMI 50.30 kg/m    Wt Readings from Last 3 Encounters:  01/20/24 275 lb (124.7 kg)  01/08/24 270 lb 6.4 oz (122.7 kg)  01/06/24 274 lb 12.8 oz (124.6 kg)     GEN: Well nourished, well developed in  no acute distress NECK: No JVD; No carotid bruits CARDIAC: Regular rate and rhythm, no murmurs, rubs, gallops RESPIRATORY:  Clear to auscultation without rales, wheezing or rhonchi  ABDOMEN: Soft, non-tender, non-distended EXTREMITIES:  No edema; No deformity   ASSESSMENT AND PLAN:    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  No obvious further episodes.  2.  Paroxysmal atrial fibrillation: Appears that AVNRT was her trigger.  He has had no further episodes of atrial fibrillation since her ablation for SVT.  3.  Obstructive sleep apnea: CPAP compliance encouraged  4.  Hypertension: Blood pressure is low today and has been low on past checks.  Geo Slone stop her Diovan  and start valsartan  80 mg daily.  This can further be adjusted by her primary physician.  Follow up with Dr. Lawana Pray as needed   Signed, Jermine Bibbee Cortland Ding, MD

## 2024-01-20 ENCOUNTER — Ambulatory Visit: Payer: Medicare Other | Attending: Cardiology | Admitting: Cardiology

## 2024-01-20 ENCOUNTER — Encounter: Payer: Self-pay | Admitting: Cardiology

## 2024-01-20 VITALS — BP 106/62 | HR 78 | Ht 62.0 in | Wt 275.0 lb

## 2024-01-20 DIAGNOSIS — I4719 Other supraventricular tachycardia: Secondary | ICD-10-CM | POA: Diagnosis not present

## 2024-01-20 DIAGNOSIS — I4819 Other persistent atrial fibrillation: Secondary | ICD-10-CM

## 2024-01-20 DIAGNOSIS — G4733 Obstructive sleep apnea (adult) (pediatric): Secondary | ICD-10-CM | POA: Diagnosis not present

## 2024-01-20 DIAGNOSIS — I10 Essential (primary) hypertension: Secondary | ICD-10-CM

## 2024-01-20 MED ORDER — VALSARTAN 80 MG PO TABS: 80.0000 mg | ORAL_TABLET | Freq: Every day | ORAL | 1 refills | Status: DC

## 2024-01-20 NOTE — Patient Instructions (Addendum)
 Medication Instructions:  Your physician has recommended you make the following change in your medication:  STOP Diovan /Hydrochlorothiazide  START Valsartan  80 mg daily  *If you need a refill on your cardiac medications before your next appointment, please call your pharmacy*  Lab Work: None ordered  If you have any lab test that is abnormal or we need to change your treatment, we will call you to review the results.  Testing/Procedures: None ordered  Follow-Up: At [facility], you and your health needs are our priority.  As part of our continuing mission to provide you with exceptional heart care, our providers are all part of one team.  This team includes your primary Cardiologist (physician) and Advanced Practice Providers or APPs (Physician Assistants and Nurse Practitioners) who all work together to provide you with the care you need, when you need it.  Your next appointment:   As needed  Provider:   Agatha Horsfall, MD    Thank you for choosing [facility]!!   Reece Cane, RN [phone]

## 2024-02-11 ENCOUNTER — Other Ambulatory Visit: Payer: Self-pay

## 2024-02-11 MED ORDER — ROSUVASTATIN CALCIUM 10 MG PO TABS: 10.0000 mg | ORAL_TABLET | Freq: Every day | ORAL | 3 refills | Status: AC

## 2024-02-20 ENCOUNTER — Ambulatory Visit: Admitting: Nurse Practitioner

## 2024-03-18 ENCOUNTER — Other Ambulatory Visit: Payer: Self-pay | Admitting: Interventional Radiology

## 2024-03-18 ENCOUNTER — Other Ambulatory Visit: Payer: Self-pay | Admitting: Orthopedic Surgery

## 2024-03-18 DIAGNOSIS — M25562 Pain in left knee: Secondary | ICD-10-CM
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
## 2024-03-20 ENCOUNTER — Ambulatory Visit
Admission: RE | Admit: 2024-03-20 | Discharge: 2024-03-20 | Disposition: A | Discharge status: Home / Self Care | Source: Ambulatory Visit | Attending: Interventional Radiology | Admitting: Interventional Radiology

## 2024-03-20 ENCOUNTER — Ambulatory Visit
Admission: RE | Admit: 2024-03-20 | Discharge: 2024-03-20 | Disposition: A | Discharge status: Home / Self Care | Source: Ambulatory Visit | Attending: Orthopedic Surgery | Admitting: Orthopedic Surgery

## 2024-03-20 DIAGNOSIS — M25562 Pain in left knee: Secondary | ICD-10-CM

## 2024-03-20 HISTORY — PX: IR RADIOLOGIST EVAL & MGMT: IMG5224

## 2024-03-24 ENCOUNTER — Other Ambulatory Visit: Payer: Self-pay | Admitting: Interventional Radiology

## 2024-03-24 DIAGNOSIS — M25562 Pain in left knee: Secondary | ICD-10-CM

## 2024-03-24 DIAGNOSIS — M1712 Unilateral primary osteoarthritis, left knee: Secondary | ICD-10-CM

## 2024-03-27 ENCOUNTER — Other Ambulatory Visit: Payer: Self-pay | Admitting: Physician Assistant

## 2024-03-27 DIAGNOSIS — I119 Hypertensive heart disease without heart failure: Secondary | ICD-10-CM

## 2024-03-30 ENCOUNTER — Telehealth: Payer: Self-pay

## 2024-03-30 DIAGNOSIS — G8929 Other chronic pain: Secondary | ICD-10-CM

## 2024-03-30 MED ORDER — METHYLPREDNISOLONE 4 MG PO TBPK: ORAL_TABLET | ORAL | 0 refills | Status: DC

## 2024-03-30 NOTE — Progress Notes (Signed)
 Rudolph, RN spoke with pt, after pt returned call, Wear comfortable clothing, NPO after midnight on Thursday, may take meds on Friday, AM with sips of water, Driver must be present for post procedure, RX called to on file pharmacy for post procedure

## 2024-04-01 MED ORDER — FENTANYL CITRATE PF 50 MCG/ML IJ SOSY: 25.0000 ug | PREFILLED_SYRINGE | INTRAMUSCULAR | Status: DC | PRN

## 2024-04-01 MED ORDER — MIDAZOLAM HCL 2 MG/2ML IJ SOLN: 1.0000 mg | INTRAMUSCULAR | Status: DC | PRN

## 2024-04-01 NOTE — Discharge Instructions (Signed)
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
## 2024-04-03 ENCOUNTER — Ambulatory Visit
Admission: RE | Admit: 2024-04-03 | Discharge: 2024-04-03 | Disposition: A | Discharge status: Home / Self Care | Source: Ambulatory Visit | Attending: Interventional Radiology | Admitting: Interventional Radiology

## 2024-04-03 DIAGNOSIS — M1712 Unilateral primary osteoarthritis, left knee: Secondary | ICD-10-CM

## 2024-04-03 DIAGNOSIS — M25562 Pain in left knee: Secondary | ICD-10-CM

## 2024-04-03 HISTORY — PX: IR EMBO ARTERIAL NOT HEMORR HEMANG INC GUIDE ROADMAPPING: IMG5448

## 2024-04-03 MED ORDER — LIDOCAINE-EPINEPHRINE 1 %-1:100000 IJ SOLN
10.0000 mL | Freq: Once | INTRAMUSCULAR | Status: AC
  Administered 2024-04-03: 10 mL via INTRADERMAL

## 2024-04-03 MED ORDER — KETOROLAC TROMETHAMINE 30 MG/ML IJ SOLN: 30.0000 mg | Freq: Once | INTRAMUSCULAR | Status: DC

## 2024-04-03 MED ORDER — KETOROLAC TROMETHAMINE 30 MG/ML IJ SOLN
30.0000 mg | Freq: Once | INTRAMUSCULAR | Status: AC
  Administered 2024-04-03: 30 mg via INTRAVENOUS

## 2024-04-03 MED ORDER — NITROGLYCERIN 1 MG/10 ML FOR IR/CATH LAB
100.0000 ug | INTRA_ARTERIAL | Status: DC | PRN
  Administered 2024-04-03: 100 ug via INTRA_ARTERIAL

## 2024-04-03 MED ORDER — DEXAMETHASONE SODIUM PHOSPHATE 10 MG/ML IJ SOLN
10.0000 mg | Freq: Once | INTRAMUSCULAR | Status: AC
  Administered 2024-04-03: 10 mg via INTRAVENOUS

## 2024-04-03 MED ORDER — IIOPAMIDOL (ISOVUE-250) INJECTION 51%
100.0000 mL | Freq: Once | INTRAVENOUS | Status: AC | PRN
  Administered 2024-04-03: 50 mL via INTRA_ARTERIAL

## 2024-04-03 MED ORDER — MIDAZOLAM HCL 2 MG/2ML IJ SOLN
1.0000 mg | INTRAMUSCULAR | Status: DC | PRN
  Administered 2024-04-03 (×4): 1 mg via INTRAVENOUS

## 2024-04-03 MED ORDER — SODIUM CHLORIDE 0.9 % IV SOLN: INTRAVENOUS | Status: DC

## 2024-04-03 MED ORDER — FENTANYL CITRATE PF 50 MCG/ML IJ SOSY
25.0000 ug | PREFILLED_SYRINGE | INTRAMUSCULAR | Status: DC | PRN
  Administered 2024-04-03 (×3): 50 ug via INTRAVENOUS

## 2024-04-03 MED ORDER — ACETAMINOPHEN 10 MG/ML IV SOLN
1000.0000 mg | Freq: Once | INTRAVENOUS | Status: AC
  Administered 2024-04-03: 1000 mg via INTRAVENOUS

## 2024-04-03 NOTE — Procedures (Signed)
 Interventional Radiology Procedure Note  Procedure: Right geniculate artery embolization   Findings: Please refer to procedural dictation for full description. Left anterior tibial artery access, 4 Fr.  Complications: None immediate  Estimated Blood Loss: < 5 ml  Recommendations: IR will arrange 1 month outpatient follow up.   Ester Sides, MD

## 2024-04-06 ENCOUNTER — Other Ambulatory Visit: Payer: Self-pay | Admitting: Interventional Radiology

## 2024-04-06 ENCOUNTER — Telehealth: Payer: Self-pay

## 2024-04-06 DIAGNOSIS — M25562 Pain in left knee: Secondary | ICD-10-CM

## 2024-04-09 ENCOUNTER — Encounter (HOSPITAL_BASED_OUTPATIENT_CLINIC_OR_DEPARTMENT_OTHER): Payer: Self-pay

## 2024-04-09 ENCOUNTER — Ambulatory Visit (HOSPITAL_BASED_OUTPATIENT_CLINIC_OR_DEPARTMENT_OTHER)
Admission: EM | Admit: 2024-04-09 | Discharge: 2024-04-09 | Disposition: A | Discharge status: Other Acute Inpatient Hospital | Attending: Family Medicine | Admitting: Family Medicine

## 2024-04-09 DIAGNOSIS — R350 Frequency of micturition: Secondary | ICD-10-CM | POA: Insufficient documentation

## 2024-04-09 DIAGNOSIS — T7840XA Allergy, unspecified, initial encounter: Secondary | ICD-10-CM | POA: Diagnosis present

## 2024-04-09 LAB — POCT URINE DIPSTICK
Bilirubin, UA: NEGATIVE
Blood, UA: NEGATIVE
Glucose, UA: NEGATIVE mg/dL
Ketones, POC UA: NEGATIVE mg/dL
Nitrite, UA: NEGATIVE
Protein Ur, POC: NEGATIVE mg/dL
Spec Grav, UA: 1.015 (ref 1.010–1.025)
Urobilinogen, UA: 0.2 U/dL
pH, UA: 5.5 (ref 5.0–8.0)

## 2024-04-09 NOTE — ED Notes (Signed)
 Patient is being discharged from the Urgent Care and sent to the Emergency Department via EMS . Per T. Bast, FNP, patient is in need of higher level of care due to Hypotension. Patient is aware and verbalizes understanding of plan of care.  Vitals:   04/09/24 1510 04/09/24 1516  BP: (!) 87/56 114/72  Pulse: 73 72  Resp:    Temp:    SpO2: 92% 97%

## 2024-04-09 NOTE — ED Notes (Signed)
 EMS contacted for transport to hospital

## 2024-04-09 NOTE — ED Notes (Signed)
 Patient reporting feeling improved, skin dry and cool. Still reports extreme weakness. Verbal reassurance given. Family remains at bedside.

## 2024-04-09 NOTE — ED Notes (Signed)
 This nurse and JAYSON Pais RN entered room to discharge patient. Patient appearing somnolent, slow to answer questions, shifting in chair. When asked how she is feeling patient says I just feel horrible. Attempted blood pressure using machine, results 69/49. Manual blood pressure performed by C Lyons 67/44. Patient moved to cart. Provider called into room. Cool cloth applied to neck. Patient reporting nausea, emesis bag provided.

## 2024-04-09 NOTE — Discharge Instructions (Addendum)
 Sending to the ER do to unstable vital signs and potential allergic reaction versus sepsis.

## 2024-04-09 NOTE — ED Triage Notes (Signed)
 Pt just completed a prednisone  dose pack for her GAE for her knee pain/inflammation. Today she had her allergy shot (has been getting them for over 20 years) and while driving down the road began to have blurred vision, throat tightness, and nausea. Pt denies chest pain or previous reactions to the allergy shots.

## 2024-04-09 NOTE — ED Notes (Signed)
 EMS arrived, report given by Niels Pais RN, care transferred to [facility].

## 2024-04-11 LAB — URINE CULTURE: Culture: 50000 — AB

## 2024-04-13 ENCOUNTER — Other Ambulatory Visit: Payer: Self-pay | Admitting: Interventional Radiology

## 2024-04-13 DIAGNOSIS — M25562 Pain in left knee: Secondary | ICD-10-CM

## 2024-04-13 NOTE — ED Provider Notes (Signed)
 [facility]    CSN: [id number] Arrival date & time: 04/09/24  1345      History   Chief Complaint Chief Complaint  Patient presents with   Respiratory Distress    HPI Rebecca Rojas is a 68 y.o. female.   Pt is a 68 year old female that presents with potential allergic reaction. Pt just completed a prednisone  dose pack for her GAE for her knee pain/inflammation. Last dose today. Today she had her allergy shot (has been getting them for over 20 years) and while driving down the road began to have blurred vision, throat tightness, and nausea. Pt denies chest pain or previous reactions to the allergy shots. Upon arrival mildly SOB, diaphoretic. Did not take epi pen.      Past Medical History:  Diagnosis Date   Allergic rhinitis 02/15/2021   Allergic rhinitis due to animal (cat) (dog) hair and dander 02/15/2021   Allergic rhinitis due to pollen 02/15/2021   Allergy    dust, environmental, feathers, has epipen , prn   Allergy-induced asthma    Anemia    Arthritis    osteoarthritis   Arthritis of right acromioclavicular joint 06/15/2019   Atrial fibrillation (HCC) 05/20/2020   Back pain    Benign essential hypertension 12/28/2015   Bilateral lower extremity edema 08/04/2018   Chronic allergic conjunctivitis 02/15/2021   Glenoid labral tear, right, initial encounter 06/15/2019   Headache    Hordeolum internum of right lower eyelid 02/14/2017   Hypertensive heart disease 06/02/2020   Insulin  resistance 10/01/2019   Joint pain    Migraine headache 12/28/2015   Mixed hyperlipidemia 12/28/2015   Morbid obesity (HCC)    Myalgia 12/28/2015   Osteoarthritis    Rotator cuff tear, right 06/15/2019   Skin lesion of foot 08/30/2016   Sleep apnea    uses cpap   Vertigo    Vitamin D  deficiency     Patient Active Problem List   Diagnosis Date Noted   Cervical paraspinal muscle spasm 01/08/2024   Morbid obesity (HCC) 09/30/2023   Encounter for immunization  05/27/2023   Longstanding persistent atrial fibrillation (HCC) 01/29/2023   Allergic rhinitis due to animal (cat) (dog) hair and dander 02/15/2021   Allergic rhinitis due to pollen 02/15/2021   Chronic allergic conjunctivitis 02/15/2021   Mild persistent asthma, uncomplicated 02/15/2021   Vitamin D  insufficiency 02/15/2021   Other specified menopausal and perimenopausal disorders 02/15/2021   Osteoarthritis    History of seasonal allergies    Arthritis    Allergy-induced asthma, mild persistent, uncomplicated    Sleep apnea with use of continuous positive airway pressure (CPAP) 05/20/2020   Atrial fibrillation with RVR (HCC) 05/20/2020   Impaired fasting glucose 10/01/2019   Rotator cuff tear, right 06/15/2019   Glenoid labral tear, right, initial encounter 06/15/2019   Arthritis of right acromioclavicular joint 06/15/2019   Bilateral lower extremity edema 08/04/2018   Vertigo 07/18/2017   Skin lesion of foot 08/30/2016   Class 3 severe obesity due to excess calories with serious comorbidity and body mass index (BMI) of 45.0 to 49.9 in adult 12/28/2015   Asthma 12/28/2015   High risk medication use 12/28/2015   Migraine headache 12/28/2015   Mixed hyperlipidemia 12/28/2015   Myalgia 12/28/2015   Hypertensive heart disease 12/28/2015    Past Surgical History:  Procedure Laterality Date   ABDOMINAL HYSTERECTOMY     BREAST BIOPSY Left 05/14/2023   US  LT BREAST BX W LOC DEV 1ST LESION IMG BX SPEC  US  GUIDE 05/14/2023 [facility]   BREAST BIOPSY Left 06/25/2023   US  LT RADIOACTIVE SEED LOC 06/25/2023 [facility]   COLONOSCOPY  09/2010   normal   FRACTURE SURGERY     left 5th finger fx with bone graft repair   HEEL SPUR EXCISION     IR EMBO ARTERIAL NOT HEMORR HEMANG INC GUIDE ROADMAPPING  04/03/2024   IR RADIOLOGIST EVAL & MGMT  03/20/2024   JOINT REPLACEMENT     KNEE ARTHROSCOPY Left    Left Knee lateral release     LIPOMA EXCISION     RADIOACTIVE SEED GUIDED  EXCISIONAL BREAST BIOPSY Left 06/26/2023   Procedure: LEFT BREAST SEED GUIDED EXCISIONAL BIOPSY;  Surgeon: Ebbie Cough, MD;  Location: [facility];  Service: General;  Laterality: Left;  LMA   SHOULDER ARTHROSCOPY WITH ROTATOR CUFF REPAIR AND SUBACROMIAL DECOMPRESSION Right 06/15/2019   Procedure: SHOULDER ARTHROSCOPY WITH ROTATOR CUFF REPAIR AND SUBACROMIAL DECOMPRESSION;  Surgeon: Yvone Rush, MD;  Location: [facility];  Service: Orthopedics;  Laterality: Right;   SVT ABLATION N/A 12/06/2020   Procedure: SVT ABLATION;  Surgeon: Inocencio Soyla Lunger, MD;  Location: [facility];  Service: Cardiovascular;  Laterality: N/A;   TONSILLECTOMY     TOTAL KNEE ARTHROPLASTY  08/03/2011   Procedure: TOTAL KNEE ARTHROPLASTY;  Surgeon: Rush LITTIE Yvone;  Location: [facility];  Service: Orthopedics;  Laterality: Right;  COMPUTER ASSISTED TOTAL KNEE REPLACEMENTwith revision tibial component   TUBAL LIGATION      OB History     Gravida  3   Para      Term      Preterm      AB      Living  3      SAB      IAB      Ectopic      Multiple      Live Births               Home Medications    Prior to Admission medications   Medication Sig Start Date End Date Taking? Authorizing Provider  albuterol  (VENTOLIN  HFA) 108 (90 Base) MCG/ACT inhaler Inhale 1-2 puffs into the lungs every 6 (six) hours as needed for wheezing or shortness of breath. 08/30/23   Sherre Clapper, MD  aspirin  EC 81 MG tablet Take 81 mg by mouth daily. Swallow whole.    [provider]  azelastine (OPTIVAR) 0.05 % ophthalmic solution Place 1 drop into both eyes as needed.    [provider]  furosemide  (LASIX ) 20 MG tablet TAKE 1 TABLET BY MOUTH DAILY 03/27/24   Milon Cleaves, PA  levocetirizine (XYZAL ) 5 MG tablet Take 1 tablet (5 mg total) by mouth every evening. 08/30/23   Cox, Clapper, MD  meloxicam (MOBIC) 15 MG tablet Take 15 mg by mouth daily as needed. 12/05/23   [provider]  methylPREDNISolone  (MEDROL  DOSEPAK) 4 MG TBPK tablet Take as prescribed by pharmacy after procedure 03/30/24   Suttle, Ester PARAS, MD  metoprolol  succinate (TOPROL -XL) 25 MG 24 hr tablet TAKE 1 TABLET BY MOUTH DAILY 01/02/24   Cox, Kirsten, MD  montelukast  (SINGULAIR ) 10 MG tablet Take 1 tablet (10 mg total) by mouth at bedtime. 08/30/23   CoxClapper, MD  rosuvastatin  (CRESTOR ) 10 MG tablet Take 1 tablet (10 mg total) by mouth daily. 02/11/24   Milon Cleaves, PA  tirzepatide  (ZEPBOUND ) 2.5 MG/0.5ML Pen Inject 2.5 mg into the skin once a week. 01/09/24  Milon Cleaves, PA  valsartan  (DIOVAN ) 80 MG tablet Take 1 tablet (80 mg total) by mouth daily. 01/20/24   Camnitz, Soyla Lunger, MD  Vitamin D , Ergocalciferol , (DRISDOL ) 1.25 MG (50000 UNIT) CAPS capsule TAKE 1 CAPSULE BY MOUTH EVERY 7  DAYS 10/09/23   Cox, Abigail, MD  WIXELA INHUB  250-50 MCG/ACT AEPB Inhale 1 puff into the lungs 2 (two) times daily. 10/08/22   [provider]    Family History Family History  Problem Relation Age of Onset   Hypertension Mother    Heart disease Mother    Heart attack Mother    Hyperlipidemia Mother    Stroke Mother    Obesity Mother    Cancer Father    Liver disease Father    Obesity Father    Anesthesia problems Neg Hx    Hypotension Neg Hx    Malignant hyperthermia Neg Hx    Pseudochol deficiency Neg Hx    Colon cancer Neg Hx    Colon polyps Neg Hx    Esophageal cancer Neg Hx    Rectal cancer Neg Hx    Stomach cancer Neg Hx    Breast cancer Neg Hx     Social History Social History   Tobacco Use   Smoking status: Never    Passive exposure: Never   Smokeless tobacco: Never  Vaping Use   Vaping status: Never Used  Substance Use Topics   Alcohol use: Never   Drug use: Never     Allergies   Metformin  and related, Erythromycin, Hydrocodone bit-homatrop mbr, and Sulfa antibiotics   Review of Systems Review of Systems  See HPI Physical Exam Triage Vital Signs ED Triage Vitals   Encounter Vitals Group     BP 04/09/24 1351 127/60     Girls Systolic BP Percentile --      Girls Diastolic BP Percentile --      Boys Systolic BP Percentile --      Boys Diastolic BP Percentile --      Pulse Rate 04/09/24 1351 85     Resp 04/09/24 1351 20     Temp 04/09/24 1351 (!) 97 F (36.1 C)     Temp Source 04/09/24 1351 Oral     SpO2 04/09/24 1351 94 %     Weight --      Height --      Head Circumference --      Peak Flow --      Pain Score 04/09/24 1352 0     Pain Loc --      Pain Education --      Exclude from Growth Chart --    No data found.  Updated Vital Signs BP 114/72 (BP Location: Right Arm)   Pulse 72   Temp (!) 97 F (36.1 C) (Oral)   Resp 20   SpO2 97%   Visual Acuity Right Eye Distance:   Left Eye Distance:   Bilateral Distance:    Right Eye Near:   Left Eye Near:    Bilateral Near:     Physical Exam Vitals and nursing note reviewed.  Constitutional:      General: She is not in acute distress.    Appearance: Normal appearance. She is diaphoretic. She is not ill-appearing or toxic-appearing.  HENT:     Mouth/Throat:     Pharynx: Oropharynx is clear.  Eyes:     Conjunctiva/sclera: Conjunctivae normal.  Cardiovascular:     Rate and Rhythm: Normal rate and regular rhythm.  Pulmonary:     Effort: Pulmonary effort is normal.     Breath sounds: Normal breath sounds.  Skin:    Coloration: Skin is pale.  Neurological:     Mental Status: She is alert.  Psychiatric:        Mood and Affect: Mood normal.      UC Treatments / Results  Labs (all labs ordered are listed, but only abnormal results are displayed) Labs Reviewed  URINE CULTURE - Abnormal; Notable for the following components:      Result Value   Culture 50,000 COLONIES/mL ESCHERICHIA COLI (*)    Organism ID, Bacteria ESCHERICHIA COLI (*)    All other components within normal limits  POCT URINE DIPSTICK - Abnormal; Notable for the following components:   Leukocytes, UA  Moderate (2+) (*)    All other components within normal limits    EKG   Radiology No results found.  Procedures Procedures (including critical care time)  Medications Ordered in UC Medications - No data to display  Initial Impression / Assessment and Plan / UC Course  I have reviewed the triage vital signs and the nursing notes.  Pertinent labs & imaging results that were available during my care of the patient were reviewed by me and considered in my medical decision making (see chart for details).     Allergic reaction and urinary frequency-patient upon arrival was having potential allergic reaction.  I am unsure if it was the combination of the allergy shot and the prednisone .  There was no specific concerns on exam initially.  She was feeling like her symptoms were getting better.  She then began to have overall just weakness, diaphoresis and blood pressure started to decrease and she became hypotensive. She was also found to have moderate leuks in her urine.  We did send for culture.  Concern for potential urinary tract infection Based on the fact the patient had a decline while she was here and vital signs were becoming more unstable we went ahead and sent to the ER for further evaluation and management. Final Clinical Impressions(s) / UC Diagnoses   Final diagnoses:  Allergic reaction, initial encounter  Urinary frequency     Discharge Instructions      Sending to the ER do to unstable vital signs and potential allergic reaction versus sepsis.     ED Prescriptions   None    PDMP not reviewed this encounter.   Adah Wilbert LABOR, FNP 04/13/24 [phone]

## 2024-04-13 NOTE — Progress Notes (Signed)
 "  Subjective:  Patient ID: Rebecca Rojas, female    DOB: May 11, 1956  Age: 68 y.o. MRN: [id number]  Chief Complaint  Patient presents with   Medical Management of Chronic Issues    HPI: Discussed the use of AI scribe software for clinical note transcription with the patient, who gave verbal consent to proceed.  Discussed the use of AI scribe software for clinical note transcription with the patient, who gave verbal consent to proceed.  History of Present Illness Rebecca Rojas is a 68 year old female who presents for hospital follow-up and chronic follow-up after an urgent care visit for an allergic reaction.  She experienced an allergic reaction following an allergy shot, which led to an urgent care visit. After receiving the shot, she went to [employer] and began feeling unwell on the way home, experiencing symptoms such as nausea, diaphoresis, tunnel vision, and difficulty seeing road signs. Her throat began to tighten, prompting her to seek urgent care. At urgent care, she was advised to take diphenhydramine  and rest, but her condition worsened, with her blood pressure dropping to 74/60, leading to an ambulance being called. She had an EpiPen  but did not use it initially to avoid an ER visit.  She has a history of receiving allergy shots and recently started a new vial, which is stronger than the previous one. She also received a pneumococcal vaccine and a high-dose influenza vaccine from [facility], which caused a significant local reaction. Her allergist has been adjusting her allergy shot dosage due to increased reactions among patients. This was her first shot after being cut back to a lower dose, yet she still experienced a severe reaction.  She has been experiencing finger locking, where her fingers lock in a position and require manipulation to release. This occurs in the joint area and affects the whole hand. She has a family history of rheumatoid arthritis on her mother's side, though  her mother did not have it.  She underwent a genicular artery embolization procedure and reports that she has less pain and swelling since the procedure. She plans to resume water aerobics and walking at the gym once it reopens. She follows a 1200-calorie diet plan to aid in weight loss.  She takes furosemide  once in the morning, which has helped reduce swelling in her legs. Her legs have been less swollen recently, though they have sore spots from previous swelling. No current difficulty breathing or swelling. Reports sore throat following the allergic reaction, which has since resolved.         01/08/2024    8:00 AM 09/30/2023    8:35 AM 05/27/2023    8:14 AM 04/03/2023   10:23 AM 02/18/2023    2:09 PM  Depression screen PHQ 2/9  Decreased Interest 0 0 0 0 0  Down, Depressed, Hopeless 0 0 0 0 0  PHQ - 2 Score 0 0 0 0 0  Altered sleeping 0 0   0  Tired, decreased energy 0 0   0  Change in appetite 0 0   0  Feeling bad or failure about yourself  0 0   0  Trouble concentrating 0 0   0  Moving slowly or fidgety/restless 0 0   0  Suicidal thoughts 0 0   0  PHQ-9 Score 0 0   0  Difficult doing work/chores Not difficult at all Not difficult at all   Not difficult at all        01/08/2024  8:00 AM  Fall Risk   Falls in the past year? 0  Number falls in past yr: 0  Injury with Fall? 0  Risk for fall due to : No Fall Risks  Follow up Falls evaluation completed    Patient Care Team: Milon Cleaves, [place] as PCP - General (Physician Assistant) Inocencio Soyla Lunger, MD as PCP - Electrophysiology (Cardiology) Monetta Redell PARAS, MD as PCP - Cardiology (Cardiology) Shellia Oh, MD (Inactive) as Consulting Physician (Pulmonary Disease) Frutoso Luz, MD as Referring Physician (Allergy)   Review of Systems  Constitutional:  Negative for appetite change, fatigue and fever.  HENT:  Negative for congestion, ear pain, sinus pressure and sore throat.   Respiratory:  Negative for cough, chest  tightness, shortness of breath and wheezing.   Cardiovascular:  Negative for chest pain and palpitations.  Gastrointestinal:  Negative for abdominal pain, constipation, diarrhea, nausea and vomiting.  Genitourinary:  Negative for dysuria and hematuria.  Musculoskeletal:  Negative for arthralgias, back pain, joint swelling and myalgias.  Skin:  Negative for rash.  Neurological:  Negative for dizziness, weakness and headaches.  Psychiatric/Behavioral:  Negative for dysphoric mood. The patient is not nervous/anxious.     Current Outpatient Medications on File Prior to Visit  Medication Sig Dispense Refill   albuterol  (VENTOLIN  HFA) 108 (90 Base) MCG/ACT inhaler Inhale 1-2 puffs into the lungs every 6 (six) hours as needed for wheezing or shortness of breath. 54 g 3   aspirin  EC 81 MG tablet Take 81 mg by mouth daily. Swallow whole.     azelastine (OPTIVAR) 0.05 % ophthalmic solution Place 1 drop into both eyes as needed.     furosemide  (LASIX ) 20 MG tablet TAKE 1 TABLET BY MOUTH DAILY 30 tablet 11   levocetirizine (XYZAL ) 5 MG tablet Take 1 tablet (5 mg total) by mouth every evening. 90 tablet 3   meloxicam (MOBIC) 15 MG tablet Take 15 mg by mouth daily as needed.     metoprolol  succinate (TOPROL -XL) 25 MG 24 hr tablet TAKE 1 TABLET BY MOUTH DAILY 90 tablet 3   montelukast  (SINGULAIR ) 10 MG tablet Take 1 tablet (10 mg total) by mouth at bedtime. 90 tablet 3   rosuvastatin  (CRESTOR ) 10 MG tablet Take 1 tablet (10 mg total) by mouth daily. 90 tablet 3   valsartan  (DIOVAN ) 80 MG tablet Take 1 tablet (80 mg total) by mouth daily. 90 tablet 1   Vitamin D , Ergocalciferol , (DRISDOL ) 1.25 MG (50000 UNIT) CAPS capsule TAKE 1 CAPSULE BY MOUTH EVERY 7  DAYS 13 capsule 3   No current facility-administered medications on file prior to visit.   Past Medical History:  Diagnosis Date   Allergic rhinitis 02/15/2021   Allergic rhinitis due to animal (cat) (dog) hair and dander 02/15/2021   Allergic rhinitis  due to pollen 02/15/2021   Allergy    dust, environmental, feathers, has epipen , prn   Allergy-induced asthma    Anemia    Arthritis    osteoarthritis   Arthritis of right acromioclavicular joint 06/15/2019   Atrial fibrillation (HCC) 05/20/2020   Back pain    Benign essential hypertension 12/28/2015   Bilateral lower extremity edema 08/04/2018   Chronic allergic conjunctivitis 02/15/2021   Glenoid labral tear, right, initial encounter 06/15/2019   Headache    Hordeolum internum of right lower eyelid 02/14/2017   Hypertensive heart disease 06/02/2020   Insulin  resistance 10/01/2019   Joint pain    Migraine headache 12/28/2015   Mixed hyperlipidemia 12/28/2015  Morbid obesity (HCC)    Myalgia 12/28/2015   Osteoarthritis    Rotator cuff tear, right 06/15/2019   Skin lesion of foot 08/30/2016   Sleep apnea    uses cpap   Vertigo    Vitamin D  deficiency    Past Surgical History:  Procedure Laterality Date   ABDOMINAL HYSTERECTOMY     BREAST BIOPSY Left 05/14/2023   US  LT BREAST BX W LOC DEV 1ST LESION IMG BX SPEC US  GUIDE 05/14/2023 [facility]   BREAST BIOPSY Left 06/25/2023   US  LT RADIOACTIVE SEED LOC 06/25/2023 [facility]   COLONOSCOPY  09/2010   normal   FRACTURE SURGERY     left 5th finger fx with bone graft repair   HEEL SPUR EXCISION     IR EMBO ARTERIAL NOT HEMORR HEMANG INC GUIDE ROADMAPPING  04/03/2024   IR RADIOLOGIST EVAL & MGMT  03/20/2024   JOINT REPLACEMENT     KNEE ARTHROSCOPY Left    Left Knee lateral release     LIPOMA EXCISION     RADIOACTIVE SEED GUIDED EXCISIONAL BREAST BIOPSY Left 06/26/2023   Procedure: LEFT BREAST SEED GUIDED EXCISIONAL BIOPSY;  Surgeon: Ebbie Cough, MD;  Location: [facility];  Service: General;  Laterality: Left;  LMA   SHOULDER ARTHROSCOPY WITH ROTATOR CUFF REPAIR AND SUBACROMIAL DECOMPRESSION Right 06/15/2019   Procedure: SHOULDER ARTHROSCOPY WITH ROTATOR CUFF REPAIR AND SUBACROMIAL  DECOMPRESSION;  Surgeon: Yvone Rush, MD;  Location: [facility];  Service: Orthopedics;  Laterality: Right;   SVT ABLATION N/A 12/06/2020   Procedure: SVT ABLATION;  Surgeon: Inocencio Soyla Lunger, MD;  Location: [facility];  Service: Cardiovascular;  Laterality: N/A;   TONSILLECTOMY     TOTAL KNEE ARTHROPLASTY  08/03/2011   Procedure: TOTAL KNEE ARTHROPLASTY;  Surgeon: Rush LITTIE Yvone;  Location: [facility];  Service: Orthopedics;  Laterality: Right;  COMPUTER ASSISTED TOTAL KNEE REPLACEMENTwith revision tibial component   TUBAL LIGATION      Family History  Problem Relation Age of Onset   Hypertension Mother    Heart disease Mother    Heart attack Mother    Hyperlipidemia Mother    Stroke Mother    Obesity Mother    Cancer Father    Liver disease Father    Obesity Father    Anesthesia problems Neg Hx    Hypotension Neg Hx    Malignant hyperthermia Neg Hx    Pseudochol deficiency Neg Hx    Colon cancer Neg Hx    Colon polyps Neg Hx    Esophageal cancer Neg Hx    Rectal cancer Neg Hx    Stomach cancer Neg Hx    Breast cancer Neg Hx    Social History   Socioeconomic History   Marital status: Married    Spouse name: Laurieann Friddle   Number of children: 2   Years of education: Not on file   Highest education level: Some college, no degree  Occupational History   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 kits  Tobacco Use   Smoking status: Never    Passive exposure: Never   Smokeless tobacco: Never  Vaping Use   Vaping status: Never Used  Substance and Sexual Activity   Alcohol use: Never   Drug use: Never   Sexual activity: Yes  Other Topics Concern   Not on file  Social History Narrative   Not on file   Social Drivers of Health   Financial Resource Strain: Patient Declined (04/12/2024)   Overall  Financial Resource Strain (CARDIA)    Difficulty of Paying Living Expenses: Patient declined  Food Insecurity: Patient Declined (04/12/2024)   Hunger Vital Sign    Worried About  Running Out of Food in the Last Year: Patient declined    Ran Out of Food in the Last Year: Patient declined  Transportation Needs: Patient Declined (04/12/2024)   PRAPARE - Administrator, Civil Service (Medical): Patient declined    Lack of Transportation (Non-Medical): Patient declined  Physical Activity: Unknown (04/12/2024)   Exercise Vital Sign    Days of Exercise per Week: Patient declined    Minutes of Exercise per Session: Not on file  Stress: Patient Declined (04/12/2024)   [employer] of Occupational Health - Occupational Stress Questionnaire    Feeling of Stress: Patient declined  Social Connections: Unknown (04/12/2024)   Social Connection and Isolation Panel    Frequency of Communication with Friends and Family: Patient declined    Frequency of Social Gatherings with Friends and Family: Patient declined    Attends Religious Services: Patient declined    Database Administrator or Organizations: Patient declined    Attends Engineer, Structural: Not on file    Marital Status: Patient declined    Objective:  BP 132/68   Pulse 93   Temp 97.8 F (36.6 C)   Ht 5' 2 (1.575 m)   Wt 272 lb (123.4 kg)   SpO2 98%   BMI 49.75 kg/m      04/14/2024    8:09 AM 04/14/2024    7:49 AM 04/09/2024    3:16 PM  BP/Weight  Systolic BP 132 98 114  Diastolic BP 68 60 72  Wt. (Lbs)  272   BMI  49.75 kg/m2     Physical Exam Vitals reviewed.  Constitutional:      Appearance: Normal appearance.  Neck:     Vascular: No carotid bruit.  Cardiovascular:     Rate and Rhythm: Normal rate and regular rhythm.     Heart sounds: Normal heart sounds.  Pulmonary:     Effort: Pulmonary effort is normal.     Breath sounds: Normal breath sounds.  Abdominal:     General: Bowel sounds are normal.     Palpations: Abdomen is soft.     Tenderness: There is no abdominal tenderness.  Musculoskeletal:     Right hand: Normal.     Left hand: Normal.     Right lower leg: 1+  Pitting Edema present.     Left lower leg: 1+ Pitting Edema present.  Neurological:     Mental Status: She is alert and oriented to person, place, and time.  Psychiatric:        Mood and Affect: Mood normal.        Behavior: Behavior normal.         Lab Results  Component Value Date   WBC 11.3 (H) 01/08/2024   HGB 13.7 01/08/2024   HCT 41.7 01/08/2024   PLT 324 01/08/2024   GLUCOSE 101 (H) 01/08/2024   CHOL 115 01/08/2024   TRIG 108 01/08/2024   HDL 53 01/08/2024   LDLCALC 42 01/08/2024   ALT 16 01/08/2024   AST 18 01/08/2024   NA 140 01/08/2024   K 4.4 01/08/2024   CL 98 01/08/2024   CREATININE 1.01 (H) 01/08/2024   BUN 27 01/08/2024   CO2 25 01/08/2024   TSH 4.020 01/08/2024   INR 1.53 (H) 08/06/2011   HGBA1C 5.8 (H)  01/08/2024      Assessment & Plan:  Hypertensive heart disease without heart failure Assessment & Plan: Well controlled.  Continue to work on eating a healthy diet and exercise.  No major side effects reported, and no issues with compliance. The current medical regimen is effective;  continue present plan with Diovon 80mg  Will adjust medication as needed depending on labs BP Readings from Last 3 Encounters:  04/14/24 132/68  04/09/24 114/72  04/03/24 125/64     Orders: -     CBC with Differential/Platelet -     Comprehensive metabolic panel with GFR  Atrial fibrillation with RVR (HCC) Assessment & Plan: Controlled Continue to monitor for any new symptoms Continue taking Metoprolol  25mg  Will adjust treatment based on symptoms  Orders: -     T4, free -     TSH  Mixed hyperlipidemia Assessment & Plan: Well-controlled LDL levels. Slightly elevated triglycerides not concerning due to dietary fluctuations.  - Continue dietary management. - Monitor lipid levels.  - Will begin using recipe book with daily recipes Lab Results  Component Value Date   LDLCALC 42 01/08/2024        Orders: -     Lipid panel  Benign essential  hypertension  Bilateral lower extremity edema Assessment & Plan: Peripheral edema has improved with current management. Significant decrease in swelling, with some residual soreness from previous swelling. - Continue current management with furosemide  once daily in the morning. -Continue taking Lasix  20mg  as prescribed   Sleep apnea with use of continuous positive airway pressure (CPAP) Assessment & Plan: Obstructive sleep apnea managed with CPAP. The patient mentioned that she is on a CPAP and that the sleep study was approved, and she was able to get a Zepbound  through the sleep study. However, she mentioned that she couldn't afford the Zepbound , which implies that the CPAP is working but possibly not as effectively as desired. - Continue CPAP therapy as prescribed.   Impaired fasting glucose Assessment & Plan: Labs drawn today Continue to monitor diet and exercise Will adjust treatment based on results Lab Results  Component Value Date   HGBA1C 5.8 (H) 01/08/2024   HGBA1C 6.1 (H) 09/30/2023   HGBA1C 6.1 (H) 05/27/2023     Orders: -     Hemoglobin A1c  Locking finger joint Assessment & Plan: Will send ANA for possible autoimmune causing locking sensation Continue to monitor symptoms Will refer to rheumatology if positive Will order extensive panel with screening for 12 auto immune issues.  Orders: -     ANA w/Reflex  Class 3 severe obesity due to excess calories with serious comorbidity and body mass index (BMI) of 45.0 to 49.9 in adult Assessment & Plan: Obesity management discussed with focus on dietary changes. She has found a 365-day menu plan for a 1200-calorie diet to aid in weight loss. Importance of maintaining a calorie-controlled diet and incorporating exercise discussed. - Follow 1200-calorie diet plan. - Resume water aerobics and consider walking on the track after swimming. - Monitor weight and adjust diet and exercise as needed.   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 Long-term immunotherapy without prior severe reactions. Potential cause discussed as new vial or recent vaccinations. - Carry EpiPen  at all times. - Contact allergist for follow-up and potential adjustment of immunotherapy dosage.   Other orders -     Wixela Inhub ; Inhale 1 puff into the  lungs 2 (two) times daily.  Dispense: 60 each; Refill: 2    Assessment and Plan Assessment & Plan      Meds ordered this encounter  Medications   WIXELA INHUB  250-50 MCG/ACT AEPB    Sig: Inhale 1 puff into the lungs 2 (two) times daily.    Dispense:  60 each    Refill:  2    Orders Placed This Encounter  Procedures   CBC with Differential/Platelet   Comprehensive metabolic panel with GFR   Lipid panel   Hemoglobin A1c   T4, free   TSH   ANA w/Reflex     Follow-up: Return in about 6 months (around 10/15/2024) for Chronic, Nola.   I,Lauren M Auman,acting as a neurosurgeon for [employer], [employer].,have documented all relevant documentation on the behalf of Nola Angles, PA,as directed by  Nola Angles, PA while in the presence of Nola Angles, [place].   An After Visit Summary was printed and given to the patient.  Nola Angles, [place] [facility] [phone] "

## 2024-04-14 ENCOUNTER — Encounter: Payer: Self-pay | Admitting: Physician Assistant

## 2024-04-14 ENCOUNTER — Ambulatory Visit (INDEPENDENT_AMBULATORY_CARE_PROVIDER_SITE_OTHER): Admitting: Physician Assistant

## 2024-04-14 VITALS — BP 132/68 | HR 93 | Temp 97.8°F | Ht 62.0 in | Wt 272.0 lb

## 2024-04-14 DIAGNOSIS — E782 Mixed hyperlipidemia: Secondary | ICD-10-CM | POA: Diagnosis not present

## 2024-04-14 DIAGNOSIS — I10 Essential (primary) hypertension: Secondary | ICD-10-CM | POA: Diagnosis not present

## 2024-04-14 DIAGNOSIS — R7301 Impaired fasting glucose: Secondary | ICD-10-CM

## 2024-04-14 DIAGNOSIS — T886XXA Anaphylactic reaction due to adverse effect of correct drug or medicament properly administered, initial encounter: Secondary | ICD-10-CM

## 2024-04-14 DIAGNOSIS — E66813 Obesity, class 3: Secondary | ICD-10-CM

## 2024-04-14 DIAGNOSIS — I4891 Unspecified atrial fibrillation: Secondary | ICD-10-CM

## 2024-04-14 DIAGNOSIS — I119 Hypertensive heart disease without heart failure: Secondary | ICD-10-CM | POA: Diagnosis not present

## 2024-04-14 DIAGNOSIS — R600 Localized edema: Secondary | ICD-10-CM

## 2024-04-14 DIAGNOSIS — G4730 Sleep apnea, unspecified: Secondary | ICD-10-CM

## 2024-04-14 DIAGNOSIS — T450X5A Adverse effect of antiallergic and antiemetic drugs, initial encounter: Secondary | ICD-10-CM | POA: Insufficient documentation

## 2024-04-14 DIAGNOSIS — Z6841 Body Mass Index (BMI) 40.0 and over, adult: Secondary | ICD-10-CM

## 2024-04-14 DIAGNOSIS — M24849 Other specific joint derangements of unspecified hand, not elsewhere classified: Secondary | ICD-10-CM | POA: Insufficient documentation

## 2024-04-14 MED ORDER — WIXELA INHUB 250-50 MCG/ACT IN AEPB: 1.0000 | INHALATION_SPRAY | Freq: Two times a day (BID) | RESPIRATORY_TRACT | 2 refills | Status: DC

## 2024-04-14 NOTE — Assessment & Plan Note (Signed)
 Will send ANA for possible autoimmune causing locking sensation Continue to monitor symptoms Will refer to rheumatology if positive Will order extensive panel with screening for 12 auto immune issues.

## 2024-04-14 NOTE — Assessment & Plan Note (Signed)
 Obstructive sleep apnea managed with CPAP. The patient mentioned that she is on a CPAP and that the sleep study was approved, and she was able to get a Zepbound  through the sleep study. However, she mentioned that she couldn't afford the Zepbound , which implies that the CPAP is working but possibly not as effectively as desired. - Continue CPAP therapy as prescribed.

## 2024-04-14 NOTE — Assessment & Plan Note (Signed)
 Recent anaphylactic reaction after starting a new vial of immunotherapy and receiving pneumonia and high-dose influenza vaccines. Long-term immunotherapy without prior severe reactions. Potential cause discussed as new vial or recent vaccinations. - Carry EpiPen  at all times. - Contact allergist for follow-up and potential adjustment of immunotherapy dosage.

## 2024-04-14 NOTE — Assessment & Plan Note (Signed)
 Labs drawn today Continue to monitor diet and exercise Will adjust treatment based on results Lab Results  Component Value Date   HGBA1C 5.8 (H) 01/08/2024   HGBA1C 6.1 (H) 09/30/2023   HGBA1C 6.1 (H) 05/27/2023

## 2024-04-14 NOTE — Assessment & Plan Note (Addendum)
 Peripheral edema has improved with current management. Significant decrease in swelling, with some residual soreness from previous swelling. - Continue current management with furosemide  once daily in the morning. -Continue taking Lasix  20mg  as prescribed

## 2024-04-14 NOTE — Assessment & Plan Note (Signed)
 Well controlled.  Continue to work on eating a healthy diet and exercise.  No major side effects reported, and no issues with compliance. The current medical regimen is effective;  continue present plan with Diovon 80mg  Will adjust medication as needed depending on labs BP Readings from Last 3 Encounters:  04/14/24 132/68  04/09/24 114/72  04/03/24 125/64

## 2024-04-14 NOTE — Assessment & Plan Note (Signed)
 Controlled Continue to monitor for any new symptoms Continue taking Metoprolol  25mg  Will adjust treatment based on symptoms

## 2024-04-14 NOTE — Assessment & Plan Note (Signed)
 Obesity management discussed with focus on dietary changes. She has found a 365-day menu plan for a 1200-calorie diet to aid in weight loss. Importance of maintaining a calorie-controlled diet and incorporating exercise discussed. - Follow 1200-calorie diet plan. - Resume water aerobics and consider walking on the track after swimming. - Monitor weight and adjust diet and exercise as needed.

## 2024-04-14 NOTE — Assessment & Plan Note (Signed)
 Well-controlled LDL levels. Slightly elevated triglycerides not concerning due to dietary fluctuations.  - Continue dietary management. - Monitor lipid levels.  - Will begin using recipe book with daily recipes Lab Results  Component Value Date   LDLCALC 42 01/08/2024

## 2024-04-15 ENCOUNTER — Ambulatory Visit: Payer: Self-pay

## 2024-04-15 LAB — COMPREHENSIVE METABOLIC PANEL WITH GFR
ALT: 11 IU/L (ref 0–32)
AST: 12 IU/L (ref 0–40)
Albumin: 3.8 g/dL — ABNORMAL LOW (ref 3.9–4.9)
Alkaline Phosphatase: 81 IU/L (ref 44–121)
BUN/Creatinine Ratio: 21 (ref 12–28)
BUN: 19 mg/dL (ref 8–27)
Bilirubin Total: 1.1 mg/dL (ref 0.0–1.2)
CO2: 28 mmol/L (ref 20–29)
Calcium: 8.8 mg/dL (ref 8.7–10.3)
Chloride: 98 mmol/L (ref 96–106)
Creatinine, Ser: 0.91 mg/dL (ref 0.57–1.00)
Globulin, Total: 2.9 g/dL (ref 1.5–4.5)
Glucose: 114 mg/dL — ABNORMAL HIGH (ref 70–99)
Potassium: 3.9 mmol/L (ref 3.5–5.2)
Sodium: 141 mmol/L (ref 134–144)
Total Protein: 6.7 g/dL (ref 6.0–8.5)
eGFR: 69 mL/min/1.73 (ref >59)

## 2024-04-15 LAB — CBC WITH DIFFERENTIAL/PLATELET
Basophils Absolute: 0.1 x10E3/uL (ref 0.0–0.2)
Basos: 1 %
EOS (ABSOLUTE): 0.2 x10E3/uL (ref 0.0–0.4)
Eos: 2 %
Hematocrit: 42.4 % (ref 34.0–46.6)
Hemoglobin: 13.7 g/dL (ref 11.1–15.9)
Immature Grans (Abs): 0 x10E3/uL (ref 0.0–0.1)
Immature Granulocytes: 0 %
Lymphocytes Absolute: 4.4 x10E3/uL — ABNORMAL HIGH (ref 0.7–3.1)
Lymphs: 38 %
MCH: 29.8 pg (ref 26.6–33.0)
MCHC: 32.3 g/dL (ref 31.5–35.7)
MCV: 92 fL (ref 79–97)
Monocytes Absolute: 0.8 x10E3/uL (ref 0.1–0.9)
Monocytes: 7 %
Neutrophils Absolute: 6 x10E3/uL (ref 1.4–7.0)
Neutrophils: 52 %
Platelets: 329 x10E3/uL (ref 150–450)
RBC: 4.6 x10E6/uL (ref 3.77–5.28)
RDW: 13.1 % (ref 11.7–15.4)
WBC: 11.4 x10E3/uL — ABNORMAL HIGH (ref 3.4–10.8)

## 2024-04-15 LAB — TSH: TSH: 3.29 u[IU]/mL (ref 0.450–4.500)

## 2024-04-15 LAB — LIPID PANEL
Chol/HDL Ratio: 2.9 ratio (ref 0.0–4.4)
Cholesterol, Total: 129 mg/dL (ref 100–199)
HDL: 45 mg/dL (ref >39)
LDL Chol Calc (NIH): 54 mg/dL (ref 0–99)
Triglycerides: 184 mg/dL — ABNORMAL HIGH (ref 0–149)
VLDL Cholesterol Cal: 30 mg/dL (ref 5–40)

## 2024-04-15 LAB — HEMOGLOBIN A1C
Est. average glucose Bld gHb Est-mCnc: 126 mg/dL
Hgb A1c MFr Bld: 6 % — ABNORMAL HIGH (ref 4.8–5.6)

## 2024-04-15 LAB — T4, FREE: Free T4: 1.56 ng/dL (ref 0.82–1.77)

## 2024-04-15 LAB — ANA W/REFLEX: ANA Titer 1: NEGATIVE

## 2024-04-16 ENCOUNTER — Ambulatory Visit: Payer: Self-pay | Admitting: Physician Assistant
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
## 2024-05-04 ENCOUNTER — Inpatient Hospital Stay
Admission: RE | Admit: 2024-05-04 | Discharge: 2024-05-04 | Disposition: A | Discharge status: Home / Self Care | Source: Ambulatory Visit | Attending: Interventional Radiology | Admitting: Interventional Radiology

## 2024-05-04 DIAGNOSIS — M25562 Pain in left knee: Secondary | ICD-10-CM

## 2024-06-08 ENCOUNTER — Other Ambulatory Visit: Payer: Self-pay | Admitting: Cardiology

## 2024-06-15 ENCOUNTER — Other Ambulatory Visit: Payer: Self-pay | Admitting: Physician Assistant

## 2024-06-21 ENCOUNTER — Other Ambulatory Visit: Payer: Self-pay | Admitting: Family Medicine

## 2024-07-24 ENCOUNTER — Other Ambulatory Visit: Payer: Self-pay | Admitting: Family Medicine

## 2024-09-14 ENCOUNTER — Other Ambulatory Visit: Payer: Self-pay | Admitting: Family Medicine

## 2024-09-14 DIAGNOSIS — E559 Vitamin D deficiency, unspecified: Secondary | ICD-10-CM

## 2024-10-15 ENCOUNTER — Ambulatory Visit: Admitting: Physician Assistant

## 2024-12-21 ENCOUNTER — Encounter: Admitting: Pulmonary Disease
